# Patient Record
Sex: Male | Born: 1955 | Race: White | Hispanic: No | Smoking: Never smoker
Health system: Southern US, Community
[De-identification: ages and names within clinical notes are randomized; demographics above are authoritative.]

## PROBLEM LIST (undated history)

## (undated) DIAGNOSIS — D126 Benign neoplasm of colon, unspecified: Secondary | ICD-10-CM

## (undated) DIAGNOSIS — M3131 Wegener's granulomatosis with renal involvement: Secondary | ICD-10-CM

## (undated) DIAGNOSIS — Z9889 Other specified postprocedural states: Secondary | ICD-10-CM

## (undated) DIAGNOSIS — N493 Fournier gangrene: Secondary | ICD-10-CM

## (undated) DIAGNOSIS — E871 Hypo-osmolality and hyponatremia: Secondary | ICD-10-CM

## (undated) DIAGNOSIS — I10 Essential (primary) hypertension: Secondary | ICD-10-CM

## (undated) DIAGNOSIS — N289 Disorder of kidney and ureter, unspecified: Secondary | ICD-10-CM

## (undated) DIAGNOSIS — R112 Nausea with vomiting, unspecified: Secondary | ICD-10-CM

## (undated) DIAGNOSIS — K922 Gastrointestinal hemorrhage, unspecified: Secondary | ICD-10-CM

## (undated) DIAGNOSIS — D649 Anemia, unspecified: Secondary | ICD-10-CM

## (undated) DIAGNOSIS — F1410 Cocaine abuse, uncomplicated: Secondary | ICD-10-CM

## (undated) DIAGNOSIS — K2100 Gastro-esophageal reflux disease with esophagitis, without bleeding: Secondary | ICD-10-CM

## (undated) DIAGNOSIS — Z9289 Personal history of other medical treatment: Secondary | ICD-10-CM

## (undated) DIAGNOSIS — E041 Nontoxic single thyroid nodule: Secondary | ICD-10-CM

## (undated) DIAGNOSIS — N179 Acute kidney failure, unspecified: Secondary | ICD-10-CM

## (undated) DIAGNOSIS — I517 Cardiomegaly: Secondary | ICD-10-CM

## (undated) DIAGNOSIS — R918 Other nonspecific abnormal finding of lung field: Secondary | ICD-10-CM

## (undated) DIAGNOSIS — K5790 Diverticulosis of intestine, part unspecified, without perforation or abscess without bleeding: Secondary | ICD-10-CM

## (undated) DIAGNOSIS — N200 Calculus of kidney: Secondary | ICD-10-CM

## (undated) HISTORY — PX: SHOULDER SURGERY: SHX246

## (undated) HISTORY — DX: Cardiomegaly: I51.7

## (undated) HISTORY — DX: Acute kidney failure, unspecified: N17.9

## (undated) HISTORY — DX: Diverticulosis of intestine, part unspecified, without perforation or abscess without bleeding: K57.90

## (undated) HISTORY — DX: Gastro-esophageal reflux disease with esophagitis: K21.0

## (undated) HISTORY — DX: Other nonspecific abnormal finding of lung field: R91.8

## (undated) HISTORY — DX: Cocaine abuse, uncomplicated: F14.10

## (undated) HISTORY — DX: Hypo-osmolality and hyponatremia: E87.1

## (undated) HISTORY — DX: Benign neoplasm of colon, unspecified: D12.6

## (undated) HISTORY — PX: ANTERIOR CRUCIATE LIGAMENT REPAIR: SHX115

## (undated) HISTORY — PX: KNEE ARTHROSCOPY W/ MEDIAL COLLATERAL LIGAMENT (MCL) REPAIR: SHX1876

## (undated) HISTORY — PX: FRACTURE SURGERY: SHX138

## (undated) HISTORY — PX: VASECTOMY: SHX75

## (undated) HISTORY — DX: Nontoxic single thyroid nodule: E04.1

## (undated) HISTORY — DX: Gastro-esophageal reflux disease with esophagitis, without bleeding: K21.00

---

## 1988-01-28 HISTORY — PX: KIDNEY STONE SURGERY: SHX686

## 1997-07-06 ENCOUNTER — Emergency Department (HOSPITAL_COMMUNITY): Admission: EM | Admit: 1997-07-06 | Discharge: 1997-07-06 | Payer: Self-pay | Admitting: Emergency Medicine

## 1997-08-14 ENCOUNTER — Ambulatory Visit (HOSPITAL_COMMUNITY): Admission: RE | Admit: 1997-08-14 | Discharge: 1997-08-14 | Payer: Self-pay | Admitting: Family Medicine

## 1999-09-28 HISTORY — PX: SHOULDER OPEN ROTATOR CUFF REPAIR: SHX2407

## 2000-01-28 HISTORY — PX: KNEE ARTHROSCOPY W/ ACL RECONSTRUCTION: SHX1858

## 2002-03-16 ENCOUNTER — Ambulatory Visit (HOSPITAL_BASED_OUTPATIENT_CLINIC_OR_DEPARTMENT_OTHER): Admission: RE | Admit: 2002-03-16 | Discharge: 2002-03-16 | Payer: Self-pay | Admitting: Orthopedic Surgery

## 2002-06-30 ENCOUNTER — Emergency Department (HOSPITAL_COMMUNITY): Admission: EM | Admit: 2002-06-30 | Discharge: 2002-06-30 | Payer: Self-pay

## 2002-07-11 ENCOUNTER — Emergency Department (HOSPITAL_COMMUNITY): Admission: EM | Admit: 2002-07-11 | Discharge: 2002-07-11 | Payer: Self-pay | Admitting: Emergency Medicine

## 2002-08-25 ENCOUNTER — Encounter: Payer: Self-pay | Admitting: *Deleted

## 2002-08-25 ENCOUNTER — Emergency Department (HOSPITAL_COMMUNITY): Admission: AD | Admit: 2002-08-25 | Discharge: 2002-08-25 | Payer: Self-pay

## 2002-09-05 ENCOUNTER — Emergency Department (HOSPITAL_COMMUNITY): Admission: EM | Admit: 2002-09-05 | Discharge: 2002-09-05 | Payer: Self-pay | Admitting: Neurosurgery

## 2002-09-05 ENCOUNTER — Encounter: Payer: Self-pay | Admitting: Emergency Medicine

## 2002-11-02 ENCOUNTER — Emergency Department (HOSPITAL_COMMUNITY): Admission: EM | Admit: 2002-11-02 | Discharge: 2002-11-02 | Payer: Self-pay | Admitting: Emergency Medicine

## 2002-11-07 ENCOUNTER — Emergency Department (HOSPITAL_COMMUNITY): Admission: EM | Admit: 2002-11-07 | Discharge: 2002-11-07 | Payer: Self-pay | Admitting: Emergency Medicine

## 2003-10-12 ENCOUNTER — Emergency Department (HOSPITAL_COMMUNITY): Admission: EM | Admit: 2003-10-12 | Discharge: 2003-10-12 | Payer: Self-pay | Admitting: Emergency Medicine

## 2003-11-03 ENCOUNTER — Emergency Department (HOSPITAL_COMMUNITY): Admission: EM | Admit: 2003-11-03 | Discharge: 2003-11-03 | Payer: Self-pay | Admitting: Family Medicine

## 2003-11-09 ENCOUNTER — Emergency Department (HOSPITAL_COMMUNITY): Admission: EM | Admit: 2003-11-09 | Discharge: 2003-11-09 | Payer: Self-pay | Admitting: Emergency Medicine

## 2004-01-09 ENCOUNTER — Emergency Department (HOSPITAL_COMMUNITY): Admission: EM | Admit: 2004-01-09 | Discharge: 2004-01-09 | Payer: Self-pay | Admitting: Family Medicine

## 2004-01-12 ENCOUNTER — Emergency Department (HOSPITAL_COMMUNITY): Admission: EM | Admit: 2004-01-12 | Discharge: 2004-01-12 | Payer: Self-pay | Admitting: Emergency Medicine

## 2004-01-23 ENCOUNTER — Emergency Department (HOSPITAL_COMMUNITY): Admission: EM | Admit: 2004-01-23 | Discharge: 2004-01-23 | Payer: Self-pay | Admitting: Emergency Medicine

## 2004-01-30 ENCOUNTER — Emergency Department: Payer: Self-pay | Admitting: Emergency Medicine

## 2004-02-01 ENCOUNTER — Emergency Department (HOSPITAL_COMMUNITY): Admission: EM | Admit: 2004-02-01 | Discharge: 2004-02-01 | Payer: Self-pay | Admitting: Emergency Medicine

## 2004-02-06 ENCOUNTER — Emergency Department: Payer: Self-pay | Admitting: Emergency Medicine

## 2004-03-26 ENCOUNTER — Emergency Department (HOSPITAL_COMMUNITY): Admission: EM | Admit: 2004-03-26 | Discharge: 2004-03-26 | Payer: Self-pay | Admitting: Family Medicine

## 2004-06-11 ENCOUNTER — Emergency Department (HOSPITAL_COMMUNITY): Admission: EM | Admit: 2004-06-11 | Discharge: 2004-06-11 | Payer: Self-pay | Admitting: Family Medicine

## 2004-09-24 ENCOUNTER — Emergency Department (HOSPITAL_COMMUNITY): Admission: EM | Admit: 2004-09-24 | Discharge: 2004-09-25 | Payer: Self-pay | Admitting: Emergency Medicine

## 2004-11-25 ENCOUNTER — Emergency Department (HOSPITAL_COMMUNITY): Admission: EM | Admit: 2004-11-25 | Discharge: 2004-11-25 | Payer: Self-pay | Admitting: Emergency Medicine

## 2004-12-01 ENCOUNTER — Emergency Department (HOSPITAL_COMMUNITY): Admission: EM | Admit: 2004-12-01 | Discharge: 2004-12-01 | Payer: Self-pay | Admitting: Emergency Medicine

## 2004-12-04 ENCOUNTER — Emergency Department: Payer: Self-pay | Admitting: Internal Medicine

## 2004-12-06 ENCOUNTER — Emergency Department (HOSPITAL_COMMUNITY): Admission: EM | Admit: 2004-12-06 | Discharge: 2004-12-06 | Payer: Self-pay | Admitting: Family Medicine

## 2004-12-16 ENCOUNTER — Emergency Department (HOSPITAL_COMMUNITY): Admission: EM | Admit: 2004-12-16 | Discharge: 2004-12-16 | Payer: Self-pay | Admitting: Emergency Medicine

## 2005-01-26 ENCOUNTER — Emergency Department (HOSPITAL_COMMUNITY): Admission: EM | Admit: 2005-01-26 | Discharge: 2005-01-26 | Payer: Self-pay | Admitting: Emergency Medicine

## 2005-05-03 ENCOUNTER — Emergency Department (HOSPITAL_COMMUNITY): Admission: EM | Admit: 2005-05-03 | Discharge: 2005-05-03 | Payer: Self-pay | Admitting: Emergency Medicine

## 2005-06-02 ENCOUNTER — Emergency Department (HOSPITAL_COMMUNITY): Admission: EM | Admit: 2005-06-02 | Discharge: 2005-06-02 | Payer: Self-pay | Admitting: Emergency Medicine

## 2005-06-29 ENCOUNTER — Emergency Department (HOSPITAL_COMMUNITY): Admission: EM | Admit: 2005-06-29 | Discharge: 2005-06-29 | Payer: Self-pay | Admitting: Emergency Medicine

## 2005-07-02 ENCOUNTER — Emergency Department: Payer: Self-pay | Admitting: Unknown Physician Specialty

## 2005-07-12 ENCOUNTER — Emergency Department (HOSPITAL_COMMUNITY): Admission: EM | Admit: 2005-07-12 | Discharge: 2005-07-12 | Payer: Self-pay | Admitting: Emergency Medicine

## 2005-07-23 ENCOUNTER — Emergency Department (HOSPITAL_COMMUNITY): Admission: EM | Admit: 2005-07-23 | Discharge: 2005-07-23 | Payer: Self-pay | Admitting: Emergency Medicine

## 2006-05-05 ENCOUNTER — Emergency Department (HOSPITAL_COMMUNITY): Admission: EM | Admit: 2006-05-05 | Discharge: 2006-05-05 | Payer: Self-pay | Admitting: Family Medicine

## 2007-08-16 ENCOUNTER — Emergency Department (HOSPITAL_COMMUNITY): Admission: EM | Admit: 2007-08-16 | Discharge: 2007-08-16 | Payer: Self-pay | Admitting: Emergency Medicine

## 2008-05-27 HISTORY — PX: INCISION AND DRAINAGE PERIRECTAL ABSCESS: SHX1804

## 2008-06-04 ENCOUNTER — Inpatient Hospital Stay (HOSPITAL_COMMUNITY): Admission: EM | Admit: 2008-06-04 | Discharge: 2008-06-09 | Payer: Self-pay | Admitting: *Deleted

## 2008-06-04 ENCOUNTER — Encounter (INDEPENDENT_AMBULATORY_CARE_PROVIDER_SITE_OTHER): Payer: Self-pay | Admitting: General Surgery

## 2008-06-21 ENCOUNTER — Emergency Department (HOSPITAL_COMMUNITY): Admission: EM | Admit: 2008-06-21 | Discharge: 2008-06-22 | Payer: Self-pay | Admitting: Emergency Medicine

## 2009-10-02 ENCOUNTER — Emergency Department (HOSPITAL_COMMUNITY): Admission: EM | Admit: 2009-10-02 | Discharge: 2009-10-03 | Payer: Self-pay | Admitting: Emergency Medicine

## 2010-03-25 ENCOUNTER — Emergency Department (HOSPITAL_COMMUNITY)
Admission: EM | Admit: 2010-03-25 | Discharge: 2010-03-25 | Disposition: A | Discharge status: Home / Self Care | Payer: Self-pay | Attending: Emergency Medicine | Admitting: Emergency Medicine

## 2010-03-25 DIAGNOSIS — B9789 Other viral agents as the cause of diseases classified elsewhere: Secondary | ICD-10-CM | POA: Insufficient documentation

## 2010-03-25 DIAGNOSIS — R52 Pain, unspecified: Secondary | ICD-10-CM | POA: Insufficient documentation

## 2010-03-25 DIAGNOSIS — R05 Cough: Secondary | ICD-10-CM | POA: Insufficient documentation

## 2010-03-25 DIAGNOSIS — R51 Headache: Secondary | ICD-10-CM | POA: Insufficient documentation

## 2010-03-25 DIAGNOSIS — R059 Cough, unspecified: Secondary | ICD-10-CM | POA: Insufficient documentation

## 2010-03-25 DIAGNOSIS — R509 Fever, unspecified: Secondary | ICD-10-CM | POA: Insufficient documentation

## 2010-03-25 DIAGNOSIS — Z85048 Personal history of other malignant neoplasm of rectum, rectosigmoid junction, and anus: Secondary | ICD-10-CM | POA: Insufficient documentation

## 2010-05-07 LAB — DIFFERENTIAL
Basophils Absolute: 0 10*3/uL (ref 0.0–0.1)
Basophils Absolute: 0 10*3/uL (ref 0.0–0.1)
Basophils Relative: 0 % (ref 0–1)
Basophils Relative: 0 % (ref 0–1)
Eosinophils Absolute: 0 10*3/uL (ref 0.0–0.7)
Eosinophils Relative: 0 % (ref 0–5)
Lymphocytes Relative: 6 % — ABNORMAL LOW (ref 12–46)
Lymphocytes Relative: 10 % — ABNORMAL LOW (ref 12–46)
Lymphs Abs: 0.6 10*3/uL — ABNORMAL LOW (ref 0.7–4.0)
Monocytes Absolute: 0.3 10*3/uL (ref 0.1–1.0)
Monocytes Absolute: 0.2 10*3/uL (ref 0.1–1.0)
Monocytes Relative: 3 % (ref 3–12)
Neutro Abs: 9.4 10*3/uL — ABNORMAL HIGH (ref 1.7–7.7)
Neutro Abs: 6.3 10*3/uL (ref 1.7–7.7)
Neutrophils Relative %: 91 % — ABNORMAL HIGH (ref 43–77)
Neutrophils Relative %: 86 % — ABNORMAL HIGH (ref 43–77)

## 2010-05-07 LAB — CBC
HCT: 41.1 % (ref 39.0–52.0)
HCT: 41.8 % (ref 39.0–52.0)
Hemoglobin: 14.2 g/dL (ref 13.0–17.0)
Hemoglobin: 14.6 g/dL (ref 13.0–17.0)
MCHC: 34.6 g/dL (ref 30.0–36.0)
MCHC: 34.9 g/dL (ref 30.0–36.0)
MCV: 85.5 fL (ref 78.0–100.0)
MCV: 88 fL (ref 78.0–100.0)
Platelets: 326 10*3/uL (ref 150–400)
Platelets: 211 10*3/uL (ref 150–400)
Platelets: 216 10*3/uL (ref 150–400)
RBC: 4.81 MIL/uL (ref 4.22–5.81)
RBC: 4.75 MIL/uL (ref 4.22–5.81)
RDW: 15 % (ref 11.5–15.5)
RDW: 14.2 % (ref 11.5–15.5)
RDW: 14.3 % (ref 11.5–15.5)
WBC: 10.3 10*3/uL (ref 4.0–10.5)
WBC: 7.4 10*3/uL (ref 4.0–10.5)

## 2010-05-07 LAB — WOUND CULTURE

## 2010-05-07 LAB — RAPID URINE DRUG SCREEN, HOSP PERFORMED
Amphetamines: NOT DETECTED
Barbiturates: NOT DETECTED
Benzodiazepines: NOT DETECTED
Cocaine: POSITIVE — AB
Opiates: POSITIVE — AB
Tetrahydrocannabinol: NOT DETECTED

## 2010-05-07 LAB — URINALYSIS, ROUTINE W REFLEX MICROSCOPIC
Glucose, UA: NEGATIVE mg/dL
Hgb urine dipstick: NEGATIVE
Leukocytes, UA: NEGATIVE
Nitrite: NEGATIVE
Protein, ur: 30 mg/dL — AB
Specific Gravity, Urine: 1.041 — ABNORMAL HIGH (ref 1.005–1.030)
Urobilinogen, UA: 1 mg/dL (ref 0.0–1.0)
pH: 5.5 (ref 5.0–8.0)

## 2010-05-07 LAB — CREATININE, SERUM
Creatinine, Ser: 1.02 mg/dL (ref 0.4–1.5)
GFR calc Af Amer: >60 mL/min (ref >60)

## 2010-05-07 LAB — BASIC METABOLIC PANEL
BUN: 17 mg/dL (ref 6–23)
BUN: 11 mg/dL (ref 6–23)
CO2: 25 mEq/L (ref 19–32)
Calcium: 10 mg/dL (ref 8.4–10.5)
Calcium: 8.2 mg/dL — ABNORMAL LOW (ref 8.4–10.5)
Chloride: 106 mEq/L (ref 96–112)
Creatinine, Ser: 1.55 mg/dL — ABNORMAL HIGH (ref 0.4–1.5)
GFR calc Af Amer: 57 mL/min — ABNORMAL LOW (ref >60)
GFR calc non Af Amer: 47 mL/min — ABNORMAL LOW (ref >60)
GFR calc non Af Amer: >60 mL/min (ref >60)
Glucose, Bld: 151 mg/dL — ABNORMAL HIGH (ref 70–99)
Glucose, Bld: 101 mg/dL — ABNORMAL HIGH (ref 70–99)
Potassium: 3.8 mEq/L (ref 3.5–5.1)
Sodium: 141 mEq/L (ref 135–145)

## 2010-05-07 LAB — ETHANOL: Alcohol, Ethyl (B): <5 mg/dL (ref 0–10)

## 2010-05-07 LAB — COMPREHENSIVE METABOLIC PANEL
Albumin: 2.2 g/dL — ABNORMAL LOW (ref 3.5–5.2)
Alkaline Phosphatase: 52 U/L (ref 39–117)
BUN: 31 mg/dL — ABNORMAL HIGH (ref 6–23)
CO2: 27 mEq/L (ref 19–32)
Chloride: 102 mEq/L (ref 96–112)
GFR calc non Af Amer: 55 mL/min — ABNORMAL LOW (ref >60)
Potassium: 3.4 mEq/L — ABNORMAL LOW (ref 3.5–5.1)
Total Bilirubin: 1.5 mg/dL — ABNORMAL HIGH (ref 0.3–1.2)

## 2010-05-07 LAB — SALICYLATE LEVEL: Salicylate Lvl: <4 mg/dL (ref 2.8–20.0)

## 2010-05-07 LAB — ABO/RH: ABO/RH(D): A POS

## 2010-05-07 LAB — URINE MICROSCOPIC-ADD ON

## 2010-05-07 LAB — ACETAMINOPHEN LEVEL
Acetaminophen (Tylenol), Serum: <10 ug/mL — ABNORMAL LOW (ref 10–30)
Acetaminophen (Tylenol), Serum: <10 ug/mL — ABNORMAL LOW (ref 10–30)

## 2010-05-07 LAB — TYPE AND SCREEN: ABO/RH(D): A POS

## 2010-05-07 LAB — PROTIME-INR
INR: 1.2 (ref 0.00–1.49)
Prothrombin Time: 15.8 seconds — ABNORMAL HIGH (ref 11.6–15.2)

## 2010-05-07 LAB — VANCOMYCIN, TROUGH: Vancomycin Tr: 14.5 ug/mL (ref 10.0–20.0)

## 2010-05-07 LAB — APTT: aPTT: 45 seconds — ABNORMAL HIGH (ref 24–37)

## 2010-05-07 LAB — ANAEROBIC CULTURE

## 2010-05-07 LAB — TRICYCLICS SCREEN, URINE: TCA Scrn: NOT DETECTED

## 2010-06-11 NOTE — Op Note (Signed)
NAME:  Alexander Grant, CHANCE NO.:  [id number]   MEDICAL RECORD NO.:  [id number]          PATIENT TYPE:  INP   LOCATION:  1313                         FACILITY:  [facility]   PHYSICIAN:  Angelia Mould. 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   POSTOPERATIVE DIAGNOSIS:  Perirectal abscess and necrotizing fasciitis  of the perineum.   OPERATION PERFORMED:  Incision and drainage of perirectal abscess,  extensive debridement of skin and subcutaneous tissue of the perirectal  area and right perineum.   SURGEON:  Angelia Mould. Derrell Lolling, M.D.   OPERATIVE INDICATIONS:  This is a healthy, 55 year old Caucasian  gentleman who presents with a 5-day history of progressive pain,  redness, swelling of his right perianal area both anteriorly and  posteriorly.  He came to the emergency room.  On examination he has a  large area of inflammation, induration, erythema, tenderness, and foul-  smelling drainage involving the right posterior and right anterior  perianal area and some tenderness extending up in the right perineum  toward the base of the scrotum.  There is necrotic skin overlying this  in the perianal area.  The penis and scrotum appear to be spared from  this.  He is brought to the operating room emergently.   OPERATIVE FINDINGS:  The patient had what appeared to be a synergistic,  necrotizing infection involving the perianal skin extending far  posteriorly and then all the way up into the anterior perineum, all the  way just about to the base of the scrotum.  This required debridement of  extensive skin and subcutaneous tissue, an area 17 x 8 cm, and  debridement of a lot of a blackened necrotic soft tissue.  After this  was done though, the infection seemed to be well confined and  controlled.   The perirectal sphincters were clearly preserved.   OPERATIVE TECHNIQUE:  Following induction of general endotracheal  anesthesia, the patient was placed in the dorsal lithotomy position.  Perianal area was prepped and draped in sterile fashion.  I examined the  wound thoroughly.  I did a digital rectal exam and the sphincter tone  appeared to be normal.  There was no rectal mass.  This did not appear  to be extending into the anal canal or rectum.   I started by making an elliptical incision in the right posterior  position and opened up the cavity and cultured the purulent material  with aerobic and anaerobic cultures.  The skin and superficial  subcutaneous tissue were clearly devascularized and necrotic and so were  simply debrided.  This debridement continued until I felt like I had  healthy bleeding tissue.  The dissection had to continue anteriorly and  there was a tract going up toward the base of the scrotum in the  perineum just on the right side of the midline.  All of this was  unroofed.  The base of the wound had some necrotic  grayish blackish  tissue and this was all debrided with electrocautery.  At no time did we  injure any of the sphincter muscles.  This did not require dissection  down into the gluteal areas or to the thigh muscles.  We felt that we  had it completely controlled.  At the end we measured the wound and it  measured 17 cm in anterior posterior dimension and 8 cm in transverse  dimension.  Photographs could not be taken as the camera was locked up  and unavailable.   The wound was further debrided using the pulsatile irrigator.  Hemostasis was very good and achieved electrocautery.  The wound was  packed with saline moistened Kerlix and covered with dry bandages.  The  patient tolerated the procedure well and was taken to the recovery room  in stable condition.  Estimated blood loss was 30-40 mL.  Complications  none.  Sponge, needle and instruments were  correct.      Angelia Mould. Derrell Lolling, M.D.  Electronically Signed     HMI/MEDQ  D:  06/04/2008  T:  06/04/2008  Job:  [id number]

## 2010-06-11 NOTE — H&P (Signed)
NAME:  Alexander Grant, Alexander Grant NO.:  [id number]   MEDICAL RECORD NO.:  [id number]          PATIENT TYPE:  EMS   LOCATION:  ED                           FACILITY:  [facility]   PHYSICIAN:  Angelia Mould. Derrell Lolling, M.D.DATE OF BIRTH:  Jun 27, 1955   DATE OF ADMISSION:  06/04/2008  DATE OF DISCHARGE:                              HISTORY & PHYSICAL   CHIEF COMPLAINT:  Right-sided perirectal pain and swelling and drainage.   HISTORY:  This is a healthy 55 year old Caucasian gentleman who states  he noted pain and swelling in his right buttock area 5 days ago.  This  has been progressive.  He says it was huge until it popped and started  draining about 12-24 hours ago.  There is still severe pain.  He has no  abdominal pain.  He has no nausea or vomiting.  He has had no shaking  chills. I was called to see him after he was evaluated by the emergency  department physician.   PAST HISTORY:  He has had a small perirectal abscess I and D'd in the  past, but it was trivial compared to this according to him.  He has had  right shoulder surgery, left knee surgery, cystoscopy for retrieval of  kidney stones.  Otherwise healthy.  No medical or surgical problems  otherwise.   MEDICATIONS:  none.   DRUG ALLERGIES:  PERCOCET, DARVOCET.   FAMILY HISTORY:  Mother living, had a stroke many years ago.  Father  deceased had lung cancer.  He has 2 siblings doing well.   SOCIAL HISTORY:  He is divorced, has 1 child.  Denies tobacco.  Denies  alcohol. 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   REVIEW OF SYSTEMS:  A 10 system review of systems is performed and is  noncontributory except as listed above.   PHYSICAL EXAMINATION:  GENERAL: Thin, healthy-appearing middle-aged  gentleman in mild to moderate distress.  VITAL SIGNS:  Temperature 97.7, pulse 86, respirations 20, blood  pressure 132/84.  HEENT:  Eyes:  Sclerae clear.  Extraocular is intact.  Ears, nose,  mouth  and throat, nose, tongue and oropharynx are without gross lesions.  NECK: Supple, nontender.  No mass.  No jugular venous distention.  LUNGS: Clear to auscultation.  CHEST:  No chest wall tenderness.  HEART:  Regular rate and rhythm.  No murmur.  No ectopy.  Radial and  posterior tibial pulses are palpable.  ABDOMEN: Soft, flat, nontender.  Liver and spleen not enlarged.  No  mass.  No hernia.  No scars.  GENITOURINARY:  The inguinal areas look  normal.  The penis, scrotum, and testes look normal.  No signs of  infection.  In the perianal area there is a huge perianal cellulitis and  abscess involving the entire right hemisphere, right anterior and right  posterior.  There is very darkened skin overlying this.  There is foul-  smelling drainage coming from this.  Infection does not appear to cross  the midline.  Rectal:  He is too tender for a rectal exam.  EXTREMITIES:  He moves all 4 extremities well without pain or deformity.  NEUROLOGIC:  No gross motor or sensory deficits.   Data were reviewed.  Lab work is ordered and pending.   ASSESSMENT:  Complex perirectal abscess right anterior and right  posterior with overlying skin necrosis.  Question whether this might be  an early Fournier's gangrene   PLAN:  1. The patient will be started on IV fluids, IV vancomycin, IV Zosyn,      and will be taken to operating room promptly for drainage and/or      debridement.  2. I have discussed this with the patient.  I have told him that he      may be left with a large wound.  I told him that he may have to be      returned to the operating room for further debridement if the      infection is extensive.  I told him that he may have bleeding      problems.  I told him that it may injure his rectum.  He      understands all of this and is ready to go the OR.  All of his      questions were answered.      Angelia Mould. Derrell Lolling, M.D.  Electronically Signed     HMI/MEDQ  D:   06/04/2008  T:  06/04/2008  Job:  [id number]

## 2010-06-14 NOTE — Discharge Summary (Signed)
NAME:  Alexander Grant, Alexander Grant NO.:  [id number]   MEDICAL RECORD NO.:  [id number]          PATIENT TYPE:  INP   LOCATION:  1313                         FACILITY:  [facility]   PHYSICIAN:  Angelia Mould. Derrell Lolling, M.D.DATE OF BIRTH:  12-09-1955   DATE OF ADMISSION:  06/04/2008  DATE OF DISCHARGE:  06/09/2008                               DISCHARGE SUMMARY   FINAL DIAGNOSES:  1. Necrotizing soft tissue infection perianal and perineal area      (Fournier's gangrene).  2. History of kidney stones.   OPERATIONS PERFORMED:  Incision and drainage of perirectal abscess,  debridement of skin and subcutaneous tissue of right perianal and  perineal area (17 x 8 cm area).  Date of surgery was May 9.   HISTORY:  This is a 55 year old Caucasian gentleman who reported pain  and swelling in his right buttock 5 days prior to admission.  The pain  and swelling was progressive.  He says it was huge until it popped about  12-24 hours prior to admission.  It has  been draining since that time.  He still has severe pain.  He had had a small perirectal abscess years  ago that healed.  I was called to evaluate him at the request of the  emergency department physician.   PHYSICAL EXAMINATION ON ADMISSION:  Revealed a temp of 97.7, pulse of 86, respirations 20.  ABDOMEN:  Soft and nontender.  GENITOURINARY:  The anal areas, penis, scrotum, and testes were normal.  The perianal area there was a huge area of cellulitis and abscess  involving the right hemisphere, right anterior and right posterior and  extending up into the perineum toward the inguinal crease but not quite  there.  The skin was very dark and overlying this there was foul-  smelling drainage.  The infection did not appear to cross the midline.   HOSPITAL COURSE:  The patient was admitted, started on IV fluid  resuscitation and IV antibiotics.  He was taken to the operating room  where this area was drained, and required extensive  debridement because  of what appeared to be a necrotizing, synergistic infection involving  the perianal area and the right anterior and right posterior area and  the perineum all the way up toward the inguinal crease.  After debriding  all this area we seemed to have everything under control.   On postoperative day #1 the patient felt better.  We started changing  his dressings.  The wounds looked pretty clean, and he did not need any  further debridement.  Cultures were polymicrobial with gram-positive  cocci, gram-negative rods, and gram-positive rods.  He was treated with  IV vancomycin and IV Primaxin.  He was seen by the wound and ostomy  incontinence nurse who assisted with wound care and pulsatile lavage.  We kept him in the hospital for several days to make sure that the  infection stayed under control, and to be sure that he resumed diet and  good bowel function.  He did resume bowel function, was completely  continent, had no problems with bowel continence, and felt  better each  day.  His cultures grew E. coli.  There was no evidence of MRSA  identified.  He was discharged on  May 14 feeling fairly well with pain under control, a clean wound, and  no unusual drainage.  He was given a prescription for oral Augmentin for  over 7 more days and a prescription for Tylox.  Home health nursing was  requested for twice daily dressing changes.  Follow-up with me in 1-2  weeks was arranged.      Angelia Mould. Derrell Lolling, M.D.  Electronically Signed     HMI/MEDQ  D:  07/25/2008  T:  07/25/2008  Job:  [id number]

## 2011-01-28 DIAGNOSIS — F1410 Cocaine abuse, uncomplicated: Secondary | ICD-10-CM

## 2011-01-28 HISTORY — DX: Cocaine abuse, uncomplicated: F14.10

## 2011-08-28 DIAGNOSIS — Z9289 Personal history of other medical treatment: Secondary | ICD-10-CM

## 2011-08-28 HISTORY — DX: Personal history of other medical treatment: Z92.89

## 2011-09-01 ENCOUNTER — Inpatient Hospital Stay (HOSPITAL_COMMUNITY)
Admission: EM | Admit: 2011-09-01 | Discharge: 2011-09-13 | DRG: 377 | Disposition: A | Discharge status: Home / Self Care | Payer: MEDICAID | Attending: Family Medicine | Admitting: Family Medicine

## 2011-09-01 ENCOUNTER — Emergency Department (HOSPITAL_COMMUNITY): Payer: Self-pay

## 2011-09-01 ENCOUNTER — Encounter (HOSPITAL_COMMUNITY): Payer: Self-pay

## 2011-09-01 ENCOUNTER — Inpatient Hospital Stay (HOSPITAL_COMMUNITY): Payer: Self-pay

## 2011-09-01 DIAGNOSIS — E041 Nontoxic single thyroid nodule: Secondary | ICD-10-CM | POA: Diagnosis present

## 2011-09-01 DIAGNOSIS — N019 Rapidly progressive nephritic syndrome with unspecified morphologic changes: Secondary | ICD-10-CM | POA: Diagnosis present

## 2011-09-01 DIAGNOSIS — E872 Acidosis, unspecified: Secondary | ICD-10-CM | POA: Diagnosis present

## 2011-09-01 DIAGNOSIS — N032 Chronic nephritic syndrome with diffuse membranous glomerulonephritis: Secondary | ICD-10-CM | POA: Diagnosis present

## 2011-09-01 DIAGNOSIS — D638 Anemia in other chronic diseases classified elsewhere: Secondary | ICD-10-CM | POA: Diagnosis present

## 2011-09-01 DIAGNOSIS — F1410 Cocaine abuse, uncomplicated: Secondary | ICD-10-CM | POA: Diagnosis present

## 2011-09-01 DIAGNOSIS — K5730 Diverticulosis of large intestine without perforation or abscess without bleeding: Secondary | ICD-10-CM | POA: Diagnosis present

## 2011-09-01 DIAGNOSIS — D126 Benign neoplasm of colon, unspecified: Secondary | ICD-10-CM | POA: Diagnosis present

## 2011-09-01 DIAGNOSIS — K2100 Gastro-esophageal reflux disease with esophagitis, without bleeding: Secondary | ICD-10-CM

## 2011-09-01 DIAGNOSIS — R0902 Hypoxemia: Secondary | ICD-10-CM

## 2011-09-01 DIAGNOSIS — N2581 Secondary hyperparathyroidism of renal origin: Secondary | ICD-10-CM | POA: Diagnosis present

## 2011-09-01 DIAGNOSIS — R0602 Shortness of breath: Secondary | ICD-10-CM | POA: Diagnosis present

## 2011-09-01 DIAGNOSIS — K2960 Other gastritis without bleeding: Secondary | ICD-10-CM

## 2011-09-01 DIAGNOSIS — I517 Cardiomegaly: Secondary | ICD-10-CM | POA: Diagnosis present

## 2011-09-01 DIAGNOSIS — K5790 Diverticulosis of intestine, part unspecified, without perforation or abscess without bleeding: Secondary | ICD-10-CM | POA: Diagnosis present

## 2011-09-01 DIAGNOSIS — K922 Gastrointestinal hemorrhage, unspecified: Secondary | ICD-10-CM

## 2011-09-01 DIAGNOSIS — N179 Acute kidney failure, unspecified: Secondary | ICD-10-CM

## 2011-09-01 DIAGNOSIS — I509 Heart failure, unspecified: Secondary | ICD-10-CM | POA: Diagnosis present

## 2011-09-01 DIAGNOSIS — K635 Polyp of colon: Secondary | ICD-10-CM

## 2011-09-01 DIAGNOSIS — F1490 Cocaine use, unspecified, uncomplicated: Secondary | ICD-10-CM

## 2011-09-01 DIAGNOSIS — I10 Essential (primary) hypertension: Secondary | ICD-10-CM | POA: Diagnosis present

## 2011-09-01 DIAGNOSIS — D62 Acute posthemorrhagic anemia: Secondary | ICD-10-CM

## 2011-09-01 DIAGNOSIS — K2971 Gastritis, unspecified, with bleeding: Principal | ICD-10-CM | POA: Diagnosis present

## 2011-09-01 DIAGNOSIS — N059 Unspecified nephritic syndrome with unspecified morphologic changes: Secondary | ICD-10-CM | POA: Diagnosis present

## 2011-09-01 DIAGNOSIS — I498 Other specified cardiac arrhythmias: Secondary | ICD-10-CM | POA: Diagnosis not present

## 2011-09-01 DIAGNOSIS — R195 Other fecal abnormalities: Secondary | ICD-10-CM

## 2011-09-01 DIAGNOSIS — Z79899 Other long term (current) drug therapy: Secondary | ICD-10-CM

## 2011-09-01 DIAGNOSIS — R918 Other nonspecific abnormal finding of lung field: Secondary | ICD-10-CM

## 2011-09-01 HISTORY — DX: Calculus of kidney: N20.0

## 2011-09-01 HISTORY — DX: Other specified postprocedural states: Z98.890

## 2011-09-01 HISTORY — DX: Nausea with vomiting, unspecified: R11.2

## 2011-09-01 HISTORY — DX: Fournier gangrene: N49.3

## 2011-09-01 LAB — CBC WITH DIFFERENTIAL/PLATELET
Eosinophils Relative: 4 % (ref 0–5)
HCT: 12.8 % — ABNORMAL LOW (ref 39.0–52.0)
Lymphocytes Relative: 20 % (ref 12–46)
Lymphs Abs: 1.2 10*3/uL (ref 0.7–4.0)
MCV: 87.7 fL (ref 78.0–100.0)
Monocytes Absolute: 0.4 10*3/uL (ref 0.1–1.0)
Monocytes Relative: 7 % (ref 3–12)
RBC: 1.46 MIL/uL — ABNORMAL LOW (ref 4.22–5.81)
RDW: 13.8 % (ref 11.5–15.5)
WBC: 6 10*3/uL (ref 4.0–10.5)

## 2011-09-01 LAB — COMPREHENSIVE METABOLIC PANEL
Albumin: 3.3 g/dL — ABNORMAL LOW (ref 3.5–5.2)
BUN: 110 mg/dL — ABNORMAL HIGH (ref 6–23)
Calcium: 9.2 mg/dL (ref 8.4–10.5)
Creatinine, Ser: 13.32 mg/dL — ABNORMAL HIGH (ref 0.50–1.35)
Total Bilirubin: 0.8 mg/dL (ref 0.3–1.2)
Total Protein: 7 g/dL (ref 6.0–8.3)

## 2011-09-01 LAB — CK: Total CK: 96 U/L (ref 7–232)

## 2011-09-01 LAB — PROTIME-INR: Prothrombin Time: 15.7 seconds — ABNORMAL HIGH (ref 11.6–15.2)

## 2011-09-01 LAB — URINE MICROSCOPIC-ADD ON

## 2011-09-01 LAB — PREPARE RBC (CROSSMATCH)

## 2011-09-01 LAB — URINALYSIS, ROUTINE W REFLEX MICROSCOPIC
Bilirubin Urine: NEGATIVE
Glucose, UA: NEGATIVE mg/dL
Ketones, ur: NEGATIVE mg/dL
Nitrite: NEGATIVE
pH: 6 (ref 5.0–8.0)

## 2011-09-01 LAB — POCT I-STAT TROPONIN I: Troponin i, poc: 0.05 ng/mL (ref 0.00–0.08)

## 2011-09-01 LAB — RETICULOCYTES
RBC.: 1.46 MIL/uL — ABNORMAL LOW (ref 4.22–5.81)
Retic Count, Absolute: 83.2 10*3/uL (ref 19.0–186.0)

## 2011-09-01 LAB — APTT: aPTT: 43 seconds — ABNORMAL HIGH (ref 24–37)

## 2011-09-01 LAB — MRSA PCR SCREENING: MRSA by PCR: NEGATIVE

## 2011-09-01 MED ORDER — METOPROLOL TARTRATE 50 MG PO TABS
50.0000 mg | ORAL_TABLET | Freq: Two times a day (BID) | ORAL | Status: DC
  Administered 2011-09-01 – 2011-09-05 (×9): 50 mg via ORAL
  Filled 2011-09-01 (×12): qty 1

## 2011-09-01 MED ORDER — SODIUM CHLORIDE 0.9 % IJ SOLN
3.0000 mL | Freq: Two times a day (BID) | INTRAMUSCULAR | Status: DC
  Administered 2011-09-01 – 2011-09-04 (×7): 3 mL via INTRAVENOUS
  Administered 2011-09-05: 10 mL via INTRAVENOUS
  Administered 2011-09-05 – 2011-09-12 (×15): 3 mL via INTRAVENOUS

## 2011-09-01 MED ORDER — ALBUTEROL SULFATE (5 MG/ML) 0.5% IN NEBU: 2.5000 mg | INHALATION_SOLUTION | RESPIRATORY_TRACT | Status: DC | PRN

## 2011-09-01 MED ORDER — PANTOPRAZOLE SODIUM 40 MG IV SOLR
8.0000 mg/h | INTRAVENOUS | Status: DC
  Filled 2011-09-01: qty 80

## 2011-09-01 MED ORDER — PANTOPRAZOLE SODIUM 40 MG IV SOLR
40.0000 mg | Freq: Two times a day (BID) | INTRAVENOUS | Status: DC
  Administered 2011-09-01 – 2011-09-08 (×13): 40 mg via INTRAVENOUS
  Filled 2011-09-01 (×15): qty 40

## 2011-09-01 MED ORDER — SODIUM CHLORIDE 0.9 % IV SOLN
1000.0000 mL | INTRAVENOUS | Status: DC
  Administered 2011-09-01: 1000 mL via INTRAVENOUS

## 2011-09-01 MED ORDER — ONDANSETRON HCL 4 MG PO TABS: 4.0000 mg | ORAL_TABLET | Freq: Four times a day (QID) | ORAL | Status: DC | PRN

## 2011-09-01 MED ORDER — ONDANSETRON HCL 4 MG/2ML IJ SOLN
4.0000 mg | Freq: Four times a day (QID) | INTRAMUSCULAR | Status: DC | PRN
  Administered 2011-09-01: 4 mg via INTRAVENOUS
  Filled 2011-09-01 (×2): qty 2

## 2011-09-01 MED ORDER — GUAIFENESIN-DM 100-10 MG/5ML PO SYRP
5.0000 mL | ORAL_SOLUTION | ORAL | Status: DC | PRN
  Filled 2011-09-01: qty 5

## 2011-09-01 MED ORDER — DEXTROSE-NACL 5-0.45 % IV SOLN: INTRAVENOUS | Status: AC

## 2011-09-01 NOTE — Progress Notes (Addendum)
IFE  Urine collected and sent. By [employer] nurse Elnita Maxwell.  Pt currently with 2nd unit of blood transfusing.  Third unit up at 22:00.  Fourth and last unit of PRBC's up 0145 am, follow up with lab staff for lab draw two hours post transfusion.

## 2011-09-01 NOTE — ED Notes (Addendum)
Pt reports generalized weakness, sob, dizziness when ambulating onset Wednesday, appeared pale, and loss of energy, and fatigue. No vision problem, denied pain. HR 79. Bp 165/91 in room. Pt runs 3 times a week, 2-3 miles per run, has loss 30 lbs since January from running; intentional weightloss.

## 2011-09-01 NOTE — ED Notes (Signed)
US at bedside

## 2011-09-01 NOTE — Progress Notes (Signed)
Alexander Grant, is a 56 y.o. male,   MRN: [id number]  -  DOB - April 28, 1955  Outpatient Primary MD for the patient is No primary provider on file.  in for    Chief Complaint  Patient presents with  . Weakness     Blood pressure 165/95, pulse 88, temperature 98.5 F (36.9 C), temperature source Oral, resp. rate 20, height 6\' 2"  (1.88 m), weight 86.183 kg (190 lb), SpO2 96.00%.  Principal Problem:  *Acute blood loss anemia Active Problems:  GI bleed  Acute renal failure   56 yo with no significant medical hx presents to [facility] ED cc weakness. Pt reports developing weakness last 2 days and sob today. This am at work, coworkers noted pallor and encouraged trip to ED. Denies abdominal pain/nausea/vomiting, BRBPR or melena. Work up in ED yields Hg 4.3, BUN 110 and creatinine 13/32.   On exam pt appears pale, weak otherwise NAD. VSS. SBP 165 HR 88.   Have called GI for consult  Will admit to SD   Lesle Chris. Black, NP

## 2011-09-01 NOTE — ED Notes (Signed)
Pt denies any SOB, cp, back pain or noticing any rash 15 minutes after starting blood transfusion

## 2011-09-01 NOTE — ED Notes (Signed)
[id number]<WG> Expected date:<BR> Expected time:<BR> Means of arrival:<BR> Comments:<BR> Triage 3

## 2011-09-01 NOTE — ED Provider Notes (Addendum)
History     CSN: [id number] Arrival date & time 09/01/11  1116 First MD Initiated Contact with Patient 09/01/11 1206     Chief Complaint  Patient presents with  . Weakness   HPI Pt started feeling weak on Monday.  He generally runs a few miles, three times a week but has been unable to do so recently.   Pt states in the last couple of days he has felt very weak asi f he would pass out.  Co workers noticed that he was very pale today.  He has no energy,.  No chest pain.  He does get short of breath when walking.  He has had some mild headache and sneezing.  He is feeling better resting and with IV fluids.  Past Medical History  Diagnosis Date  . Kidney stone   . H/O rotator cuff surgery   . H/O removal of cyst     No past surgical history on file.  No family history on file.  History  Substance Use Topics  . Smoking status: Never Smoker   . Smokeless tobacco: Not on file  . Alcohol Use: No      Review of Systems  Constitutional: Negative for fever and unexpected weight change.  Respiratory: Positive for cough.   Cardiovascular: Negative for chest pain.  Gastrointestinal: Negative for vomiting, abdominal pain and blood in stool.  Genitourinary: Negative for dysuria.  All other systems reviewed and are negative.    Allergies  Percocet  Home Medications   Current Outpatient Rx  Name Route Sig Dispense Refill  . NAPROXEN SODIUM 220 MG PO TABS Oral Take 440 mg by mouth once.    Marland Kitchen PSEUDOEPH-DOXYLAMINE-DM-APAP 60-7.06-25-998 MG/30ML PO LIQD Oral Take 30 mLs by mouth every 4 (four) hours as needed. Cold and sinus symptons      BP 166/84  Pulse 79  Temp 97.7 F (36.5 C) (Oral)  Resp 18  Ht 6\' 2"  (1.88 m)  Wt 190 lb (86.183 kg)  BMI 24.39 kg/m2  SpO2 96%  Physical Exam  Nursing note and vitals reviewed. Constitutional: He appears well-developed and well-nourished. No distress.  HENT:  Head: Normocephalic and atraumatic.  Right Ear: External ear normal.  Left  Ear: External ear normal.  Mouth/Throat: No oropharyngeal exudate.  Eyes: Conjunctivae are normal. Right eye exhibits no discharge. Left eye exhibits no discharge. No scleral icterus.       Pale conjunctiva   Neck: Neck supple. No tracheal deviation present.  Cardiovascular: Normal rate, regular rhythm and intact distal pulses.   Pulmonary/Chest: Effort normal and breath sounds normal. No stridor. No respiratory distress. He has no wheezes. He has no rales.  Abdominal: Soft. Bowel sounds are normal. He exhibits no distension. There is no tenderness. There is no rebound and no guarding.  Musculoskeletal: He exhibits no edema and no tenderness.  Neurological: He is alert. He has normal strength. No sensory deficit. Cranial nerve deficit:  no gross defecits noted. He exhibits normal muscle tone. He displays no seizure activity. Coordination normal.  Skin: Skin is warm and dry. No rash noted. There is pallor.  Psychiatric: He has a normal mood and affect.    ED Course  Procedures (including critical care time)  CRITICAL CARE Performed by: Linwood Dibbles R Total critical care time: 35 Critical care time was exclusive of separately billable procedures and treating other patients. Critical care was necessary to treat or prevent imminent or life-threatening deterioration. Critical care was time spent personally by me on  the following activities: development of treatment plan with patient and/or surrogate as well as nursing, discussions with consultants, evaluation of patient's response to treatment, examination of patient, obtaining history from patient or surrogate, ordering and performing treatments and interventions, ordering and review of laboratory studies, ordering and review of radiographic studies, pulse oximetry and re-evaluation of patient's condition.    EKG Rate 84, nl intervals, nl st - t waves SINUS RHYTHM ~ normal P axis, V-rate 50- 99 VENTRICULAR PREMATURE COMPLEX ~ V complex w/  short R-R interval PROBABLE LATERAL INFARCT, AGE INDETERMINATE ~ Q >52mS, I aVL V5 V6 Labs Reviewed  APTT - Abnormal; Notable for the following:    aPTT 43 (*)     All other components within normal limits  PROTIME-INR - Abnormal; Notable for the following:    Prothrombin Time 15.7 (*)     All other components within normal limits  CBC WITH DIFFERENTIAL - Abnormal; Notable for the following:    RBC 1.46 (*)     Hemoglobin 4.3 (*)     HCT 12.8 (*)     All other components within normal limits  TYPE AND SCREEN  PREPARE RBC (CROSSMATCH)  OCCULT BLOOD, POC DEVICE  COMPREHENSIVE METABOLIC PANEL  VITAMIN B12  FOLATE  IRON AND TIBC  FERRITIN  RETICULOCYTES   Dg Chest Portable 1 View  09/01/2011  *RADIOLOGY REPORT*  Clinical Data: Weakness.  Dizziness.  Cough.  Short of breath.  PORTABLE CHEST - 1 VIEW  Comparison: None.  Findings: Cardiomegaly is present.  There is diffuse bilateral airspace disease.  This has an almost uniform distribution.  The appearance is most compatible with cardiogenic pulmonary edema with multifocal pneumonia considered less likely. Monitoring leads are projected over the chest.  High-riding left humeral head is present, suggesting a chronic rotator cuff tear.  No effusion.  IMPRESSION: Cardiomegaly and diffuse bilateral airspace disease most compatible with CHF.  Multifocal pneumonia is in the differential considerations.  Original Report Authenticated By: Andreas Newport, M.D.     1. Acute blood loss anemia   2. GI bleed       MDM  The patient has new onset of profound anemia. He had a normal blood count as of several years ago. Patient states he' has only been taking an occasional Naprosyn. He denies having any blood in his stools or dark black stools.  His rectal exam showed normal color stool but it is guaiac positive. The most likely cause of his anemia is gastrointestinal bleeding. At this time he remains hemodynamically stable. I have ordered blood  transfusions. 4 units. He will be monitored closely. I will consult the medicine service for admission and further treatment and evaluation.   BMET reveals new onset renal failure as well. Suspect this is a result of the anemia but will need to investigate further. ?CHF vs pna.  Legrand Rams the former considering the renal falure.  Will add on bnp and troponin.  Celene Kras, MD 09/01/11 [phone]
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
## 2011-09-01 NOTE — ED Notes (Signed)
Pt still in triage.
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
## 2011-09-01 NOTE — ED Notes (Signed)
Patient is resting comfortably. 

## 2011-09-02 ENCOUNTER — Inpatient Hospital Stay (HOSPITAL_COMMUNITY): Payer: MEDICAID

## 2011-09-02 DIAGNOSIS — N179 Acute kidney failure, unspecified: Secondary | ICD-10-CM

## 2011-09-02 DIAGNOSIS — R0602 Shortness of breath: Secondary | ICD-10-CM

## 2011-09-02 LAB — CBC
HCT: 22.2 % — ABNORMAL LOW (ref 39.0–52.0)
Hemoglobin: 7.5 g/dL — ABNORMAL LOW (ref 13.0–17.0)
MCH: 28.7 pg (ref 26.0–34.0)
MCHC: 33.8 g/dL (ref 30.0–36.0)
RDW: 15.1 % (ref 11.5–15.5)

## 2011-09-02 LAB — RAPID URINE DRUG SCREEN, HOSP PERFORMED
Amphetamines: NOT DETECTED
Cocaine: POSITIVE — AB
Opiates: NOT DETECTED
Tetrahydrocannabinol: NOT DETECTED

## 2011-09-02 LAB — TSH: TSH: 1.93 u[IU]/mL (ref 0.350–4.500)

## 2011-09-02 LAB — FERRITIN: Ferritin: 79 ng/mL (ref 22–322)

## 2011-09-02 LAB — BASIC METABOLIC PANEL
BUN: 103 mg/dL — ABNORMAL HIGH (ref 6–23)
Creatinine, Ser: 12.04 mg/dL — ABNORMAL HIGH (ref 0.50–1.35)
GFR calc Af Amer: 5 mL/min — ABNORMAL LOW (ref >90)
GFR calc non Af Amer: 4 mL/min — ABNORMAL LOW (ref >90)
Glucose, Bld: 103 mg/dL — ABNORMAL HIGH (ref 70–99)
Potassium: 4.5 mEq/L (ref 3.5–5.1)

## 2011-09-02 LAB — HEPATITIS B SURFACE ANTIBODY,QUALITATIVE: Hep B S Ab: NEGATIVE

## 2011-09-02 LAB — HEPATITIS C ANTIBODY (REFLEX): HCV Ab: NEGATIVE

## 2011-09-02 LAB — IRON AND TIBC: Iron: 23 ug/dL — ABNORMAL LOW (ref 42–135)

## 2011-09-02 LAB — STREP PNEUMONIAE URINARY ANTIGEN: Strep Pneumo Urinary Antigen: NEGATIVE

## 2011-09-02 LAB — PATHOLOGIST SMEAR REVIEW

## 2011-09-02 LAB — HEMOGLOBIN A1C: Hgb A1c MFr Bld: 4.7 % (ref <5.7)

## 2011-09-02 LAB — CREATININE, URINE, RANDOM: Creatinine, Urine: 110.9 mg/dL

## 2011-09-02 NOTE — Progress Notes (Signed)
Patient ID: Alexander Grant, male   DOB: [birth year], 56 y.o.   MRN: [id number]  Gi is following- hgb up to 7.5 post transfusions. Creatinine 12 today-nephrology workup in progress.  Will look for nephrology to advise regarding timing of EGD/Colonoscopy during hospitalization- non urgent as no active bleeding.

## 2011-09-02 NOTE — Progress Notes (Signed)
Subjective:  Hgb up to the 7s after 4 units of blood.  Had over a liter of UOP.  U/S basically unremarkable.  Other serologic tests pending. He says he feels better Objective Vital signs in last 24 hours: Filed Vitals:   09/02/11 0500 09/02/11 0600 09/02/11 0700 09/02/11 0800  BP: 141/89 138/79 144/75 133/73  Pulse: 70 68 73 64  Temp:  98.2 F (36.8 C)  98.9 F (37.2 C)  TempSrc:  Oral  Oral  Resp: 25 30 24 26   Height:      Weight:      SpO2: 98% 96% 98% 87%   Weight change:   Intake/Output Summary (Last 24 hours) at 09/02/11 1219 Last data filed at 09/02/11 0800  Gross per 24 hour  Intake   2150 ml  Output   1475 ml  Net    675 ml   Labs: Basic Metabolic Panel:  Lab 09/02/11 1610 09/01/11 1243  NA 140 141  K 4.5 3.8  CL 105 102  CO2 19 20  GLUCOSE 103* 104*  BUN 103* 110*  CREATININE 12.04* 13.32*  CALCIUM 9.0 9.2  ALB -- --  PHOS -- --   Liver Function Tests:  Lab 09/01/11 1243  AST 11  ALT 6  ALKPHOS 61  BILITOT 0.8  PROT 7.0  ALBUMIN 3.3*   No results found for this basename: LIPASE:3,AMYLASE:3 in the last 168 hours No results found for this basename: AMMONIA:3 in the last 168 hours CBC:  Lab 09/02/11 0655 09/01/11 1243  WBC 6.8 6.0  NEUTROABS -- 4.2  HGB 7.5* 4.3*  HCT 22.2* 12.8*  MCV 85.1 87.7  PLT 194 217   Cardiac Enzymes:  Lab 09/01/11 1422  CKTOTAL 96  CKMB --  CKMBINDEX --  TROPONINI --   CBG: No results found for this basename: GLUCAP:5 in the last 168 hours  Iron Studies:  Basename 09/01/11 1243  IRON 23*  TIBC 304  TRANSFERRIN --  FERRITIN 79   Studies/Results: Ct Chest Wo Contrast  09/02/2011  *RADIOLOGY REPORT*  Clinical Data: Shortness of breath, cough.  Pulmonary infiltrates.  CT CHEST WITHOUT CONTRAST  Technique:  Multidetector CT imaging of the chest was performed following the standard protocol without IV contrast.  Comparison: Plain films 09/01/2011  Findings: Diffuse bilateral interstitial and ground-glass  opacities in the lungs.  This is slightly more pronounced in the mid and upper lung zones, but involves all lung zones.  Relative sparing in the bases.  There are small bilateral pleural effusions.  Heart is normal size.  Borderline sized mediastinal lymph nodes, none pathologically enlarged.  No definite hilar adenopathy.  No mediastinal adenopathy.  Coronary artery calcifications are noted, most pronounced in the left anterior descending artery.  15 mm low density nodule in the right thyroid lobe.  Chest wall soft tissues are unremarkable. Imaging into the upper abdomen shows no acute findings.  IMPRESSION: Interstitial and ground-glass opacities throughout the lungs with relative sparing of the bases.  This is nonspecific and could represent pulmonary edema, hemorrhage, infection or ARDS.  Small bilateral pleural effusions.  Coronary artery disease.  Right thyroid nodule.  This can be further characterized with elective thyroid ultrasound.  Original Report Authenticated By: Cyndie Chime, M.D.   US Renal  09/01/2011  *RADIOLOGY REPORT*  Clinical Data:  Acute renal failure  RENAL/URINARY TRACT ULTRASOUND COMPLETE  Comparison:  None.  Findings:  Right Kidney:  Normal in size and parenchymal echogenicity.  No evidence of mass or  hydronephrosis. The right kidney measures 10 cm in length.  Left Kidney:  Normal in size and parenchymal echogenicity.  No evidence of mass or hydronephrosis. The left kidney measures 10.8 cm in length.  Bladder:  Appears normal for degree of bladder distention.  IMPRESSION: Normal study.  Original Report Authenticated By: Alvino Blood Chest Portable 1 View  09/01/2011  *RADIOLOGY REPORT*  Clinical Data: Weakness.  Dizziness.  Cough.  Short of breath.  PORTABLE CHEST - 1 VIEW  Comparison: None.  Findings: Cardiomegaly is present.  There is diffuse bilateral airspace disease.  This has an almost uniform distribution.  The appearance is most compatible with cardiogenic pulmonary edema with  multifocal pneumonia considered less likely. Monitoring leads are projected over the chest.  High-riding left humeral head is present, suggesting a chronic rotator cuff tear.  No effusion.  IMPRESSION: Cardiomegaly and diffuse bilateral airspace disease most compatible with CHF.  Multifocal pneumonia is in the differential considerations.  Original Report Authenticated By: Andreas Newport, M.D.   Medications: Infusions:    . DISCONTD: sodium chloride 1,000 mL (09/01/11 1234)  . DISCONTD: pantoprozole (PROTONIX) infusion      Scheduled Medications:    . dextrose 5 % and 0.45% NaCl   Intravenous STAT  . metoprolol tartrate  50 mg Oral BID  . pantoprazole (PROTONIX) IV  40 mg Intravenous Q12H  . sodium chloride  3 mL Intravenous Q12H    have reviewed scheduled and prn medications.  Physical Exam: General: NAD, talkative.  They took off the O2 today, sats not what they should be and appears to have increased resp rate Heart: RRR Lungs: diffuse crackles Abdomen: soft, non tender Extremities: no edema whatsoever   I Assessment/ Plan: Pt is a 56 y.o. yo male who was admitted on 09/01/2011 with elevated creatinine of unknown etiology last known creatinine as high as 1.5 3 years ago.   Assessment/Plan: 1. Renal- acute versus subacute versus chronic kidney disease.  So far we have an ultrasound which is not that remarkable.  U/A shows granular casts so could be as simple as ATN, but U/ A also shows microscopic hematuria and mild proteinuria, raising the issue, along with CXR findings, for a GN, possibly a pulm renal type GN.  Creatinine is down slightly today, although not greatly.  Patient appears to be nonoliguric and there are no indications for HD.  Full serologic eval is pending.  May need kidney biopsy now end week if we fail to see continued improvement in renal function.   2.  Anemia- improved with transfusion.  Iron stores were low.  Had heme positive stools so will need work up at some  point.  I will add aranesp as well.  LDH is high, platelets seem within normal limilts.  3. HTN/Volume- difficult to assess his volume status.  He has no peripheral edema but has findings on CXR.  His BP is reasonable off of IVF so really is not appearing to be volume overloaded.  No lasix or IVF at this time.  On metoprolol.      Mateen Franssen A   09/02/2011,12:19 PM  LOS: 1 day

## 2011-09-02 NOTE — Progress Notes (Signed)
*  PRELIMINARY RESULTS* Echocardiogram 2D Echocardiogram has been performed.  Alexander Grant 09/02/2011, 3:25 PM

## 2011-09-02 NOTE — Progress Notes (Signed)
Called Lab staff twice to remind them of  the lab draws for pt to be drawn at 0600 am.  Lab staff stated "they will be there shortly". Pt scheduled for a CBC and a BMP this am.

## 2011-09-02 NOTE — Progress Notes (Signed)
CARE MANAGEMENT NOTE 09/02/2011  Patient:  Alexander Grant, Alexander Grant   Account Number:  [id number]  Date Initiated:  09/02/2011  Documentation initiated by:  Cay Kath  Subjective/Objective Assessment:   patient with gb 4.3, source of bleeding unknown, required 4 units prbc- hgb rose to 7.5.     Action/Plan:   lives at home   Anticipated DC Date:  09/05/2011   Anticipated DC Plan:  HOME/SELF CARE  In-house referral  NA      DC Planning Services  NA      [facility] Choice  NA   Choice offered to / List presented to:  NA   DME arranged  NA      DME agency  NA     HH arranged  NA      HH agency  NA   Status of service:  In process, will continue to follow Medicare Important Message given?  NA - LOS <3 / Initial given by admissions (If response is "NO", the following Medicare IM given date fields will be blank) Date Medicare IM given:   Date Additional Medicare IM given:    Discharge Disposition:    Per UR Regulation:  Reviewed for med. necessity/level of care/duration of stay  If discussed at Long Length of Stay Meetings, dates discussed:    Comments:  [id number] Earlene Plater, RN, BSN, CCM: CHART REVIEWED AND UPDATED. NO DISCHARGE NEEDS PRESENT AT THIS TIME. CASE MANAGEMENT [phone]

## 2011-09-02 NOTE — Progress Notes (Addendum)
[facility]                                                                                Patient Demographics  Alexander Grant, is a 56 y.o. male  RUE:[id number]  JYN:[id number]  DOB - 11/18/1955  Admit date - 09/01/2011  Admitting Physician Alexander Espy, MD  Outpatient Primary MD for the patient is No primary provider on file.  LOS - 1   Chief Complaint  Patient presents with  . Weakness        Assessment & Plan    1.Severe anemia with heme positive stools and acute renal failure - most likely his anemia is due to chronic blood loss vs ? CKD , patient denies any unintentional weight loss, he denies noticing any frank blood or dark stools , no nausea vomiting or heartburn , he does use NSAIDs infrequent , anemia panel is inconclusive although suggestive of some iron deficiency his MCV MCH and MCHC are normal, for now we'll withhold IV iron, he is stable post 4 units of packed RBC transfusions on 09/01/2011 and 09/02/2011, he is on IV PPI and GI is following the patient.     2.Renal failure - last creatinine in the system is under 1 which was 3 years ago , likely his acute renal failure although CKD cannot be ruled out since we have no lab work in the last 3 years, patient does have history of ureteric stents placed a few years ago due to kidney stone , his renal ultrasound is stable, his UA does show some protein and blood in it with stable CK levels, ANCA has been ordered by me do to chest x-ray and abnormal CT chest, further renal workup per nephrology who are following the patient, if creatinine does not improve or he becomes symptomatic he will need dialysis and transferred to [facility].    3.Exertional shortness of breath and elevated proBNP - this is most likely secondary to severe anemia versus cardiac decompensation from anemia, however in the light of abnormal chest x-ray CT chest noncontrast was ordered preliminary report per radiology is diffuse  interstitial infiltrates, patient does not appear to have an active infection, he is afebrile, no leukocytosis, minimal cough if any, as above have ordered ANA and ANCA, will request pulmonary to review the CT and give their opinion (fluid vs Auto Immune Lung injury) , oxygen as needed, nebulizers when necessary. Of note patient has good urine output and has no peripheral edema. Echo has been ordered.     4. HTN - place him on a moderate dose Lopressor with gentle lowering of blood pressure. Blood pressures are stable.     Code Status: Full  Family Communication: Discussed with the  Disposition Plan: To be decided    Procedures renal ultrasound, CT noncontrast chest    Consults  Renal, PCCM called today   Antibiotics  none   Time Spent in minutes   35   DVT Prophylaxis  SCDs    Susa Raring K M.D on 09/02/2011 at 8:51 AM  Between 7am to 7pm - Pager - [phone]  After 7pm go to [email or web address] - password [email or web address]  And look for  the night coverage person covering for me after hours  [facility] Office  [phone]    Subjective:   [name] today has, No headache, No chest pain, No abdominal pain - No Nausea, No new weakness tingling or numbness, No Cough - SOB.   Objective:   Filed Vitals:   09/02/11 0500 09/02/11 0600 09/02/11 0700 09/02/11 0800  BP: 141/89 138/79 144/75 133/73  Pulse: 70 68 73 64  Temp:  98.2 F (36.8 C)  98.9 F (37.2 C)  TempSrc:  Oral  Oral  Resp: 25 30 24 26   Height:      Weight:      SpO2: 98% 96% 98% 87%    Wt Readings from Last 3 Encounters:  09/01/11 86.183 kg (190 lb)     Intake/Output Summary (Last 24 hours) at 09/02/11 0851 Last data filed at 09/02/11 0800  Gross per 24 hour  Intake   2150 ml  Output   1475 ml  Net    675 ml    Exam Awake Alert, Oriented X 3, No new F.N deficits, Normal affect [place].AT,PERRAL Supple Neck,No JVD, No cervical lymphadenopathy appriciated.  Symmetrical Chest wall  movement, Good air movement bilaterally, few bibasilar rales today RRR,No Gallops,Rubs or new Murmurs, No Parasternal Heave +ve B.Sounds, Abd Soft, Non tender, No organomegaly appriciated, No rebound - guarding or rigidity. No Cyanosis, Clubbing or edema, No new Rash or bruise     Data Review   CBC  Lab 09/02/11 0655 09/01/11 1243  WBC 6.8 6.0  HGB 7.5* 4.3*  HCT 22.2* 12.8*  PLT 194 217  MCV 85.1 87.7  MCH 28.7 29.5  MCHC 33.8 33.6  RDW 15.1 13.8  LYMPHSABS -- 1.2  MONOABS -- 0.4  EOSABS -- 0.2  BASOSABS -- 0.0  BANDABS -- --    Chemistries   Lab 09/02/11 0655 09/01/11 1243  NA 140 141  K 4.5 3.8  CL 105 102  CO2 19 20  GLUCOSE 103* 104*  BUN 103* 110*  CREATININE 12.04* 13.32*  CALCIUM 9.0 9.2  MG -- --  AST -- 11  ALT -- 6  ALKPHOS -- 61  BILITOT -- 0.8   ------------------------------------------------------------------------------------------------------------------ estimated creatinine clearance is 7.9 ml/min (by C-G formula based on Cr of 12.04). ------------------------------------------------------------------------------------------------------------------ No results found for this basename: HGBA1C:2 in the last 72 hours ------------------------------------------------------------------------------------------------------------------ No results found for this basename: CHOL:2,HDL:2,LDLCALC:2,TRIG:2,CHOLHDL:2,LDLDIRECT:2 in the last 72 hours ------------------------------------------------------------------------------------------------------------------  [facility] 09/01/11 1422  TSH 1.930  T4TOTAL --  T3FREE --  THYROIDAB --   ------------------------------------------------------------------------------------------------------------------  Alvira Philips 09/01/11 1243  VITAMINB12 385  FOLATE 7.7  FERRITIN 79  TIBC 304  IRON 23*  RETICCTPCT 5.7*    Coagulation profile  Lab 09/01/11 1243  INR 1.22  PROTIME --    No results found for this  basename: DDIMER:2 in the last 72 hours  Cardiac Enzymes No results found for this basename: CK:3,CKMB:3,TROPONINI:3,MYOGLOBIN:3 in the last 168 hours ------------------------------------------------------------------------------------------------------------------ No components found with this basename: POCBNP:3  Micro Results Recent Results (from the past 240 hour(s))  MRSA PCR SCREENING     Status: Normal   Collection Time   09/01/11  5:43 PM      Component Value Range Status Comment   MRSA by PCR NEGATIVE  NEGATIVE Final     Radiology Reports US Renal  09/01/2011  *RADIOLOGY REPORT*  Clinical Data:  Acute renal failure  RENAL/URINARY TRACT ULTRASOUND COMPLETE  Comparison:  None.  Findings:  Right Kidney:  Normal in  size and parenchymal echogenicity.  No evidence of mass or hydronephrosis. The right kidney measures 10 cm in length.  Left Kidney:  Normal in size and parenchymal echogenicity.  No evidence of mass or hydronephrosis. The left kidney measures 10.8 cm in length.  Bladder:  Appears normal for degree of bladder distention.  IMPRESSION: Normal study.  Original Report Authenticated By: Alvino Blood Chest Portable 1 View  09/01/2011  *RADIOLOGY REPORT*  Clinical Data: Weakness.  Dizziness.  Cough.  Short of breath.  PORTABLE CHEST - 1 VIEW  Comparison: None.  Findings: Cardiomegaly is present.  There is diffuse bilateral airspace disease.  This has an almost uniform distribution.  The appearance is most compatible with cardiogenic pulmonary edema with multifocal pneumonia considered less likely. Monitoring leads are projected over the chest.  High-riding left humeral head is present, suggesting a chronic rotator cuff tear.  No effusion.  IMPRESSION: Cardiomegaly and diffuse bilateral airspace disease most compatible with CHF.  Multifocal pneumonia is in the differential considerations.  Original Report Authenticated By: Andreas Newport, M.D.    Scheduled Meds:   . dextrose 5 % and 0.45%  NaCl   Intravenous STAT  . metoprolol tartrate  50 mg Oral BID  . pantoprazole (PROTONIX) IV  40 mg Intravenous Q12H  . sodium chloride  3 mL Intravenous Q12H   Continuous Infusions:   . DISCONTD: sodium chloride 1,000 mL (09/01/11 1234)  . DISCONTD: pantoprozole (PROTONIX) infusion     PRN Meds:.albuterol, guaiFENesin-dextromethorphan, ondansetron (ZOFRAN) IV, ondansetron
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
## 2011-09-03 ENCOUNTER — Inpatient Hospital Stay (HOSPITAL_COMMUNITY): Payer: MEDICAID

## 2011-09-03 DIAGNOSIS — F1490 Cocaine use, unspecified, uncomplicated: Secondary | ICD-10-CM | POA: Diagnosis present

## 2011-09-03 DIAGNOSIS — F1410 Cocaine abuse, uncomplicated: Secondary | ICD-10-CM

## 2011-09-03 LAB — TYPE AND SCREEN
ABO/RH(D): A POS
Antibody Screen: NEGATIVE
Unit division: 0
Unit division: 0

## 2011-09-03 LAB — CBC
MCH: 29.4 pg (ref 26.0–34.0)
MCV: 86 fL (ref 78.0–100.0)
Platelets: 235 10*3/uL (ref 150–400)
RDW: 15.5 % (ref 11.5–15.5)
WBC: 7.8 10*3/uL (ref 4.0–10.5)

## 2011-09-03 LAB — BASIC METABOLIC PANEL
BUN: 98 mg/dL — ABNORMAL HIGH (ref 6–23)
Calcium: 9.5 mg/dL (ref 8.4–10.5)
Chloride: 102 mEq/L (ref 96–112)
Creatinine, Ser: 11.1 mg/dL — ABNORMAL HIGH (ref 0.50–1.35)
GFR calc non Af Amer: 4 mL/min — ABNORMAL LOW (ref >90)
Potassium: 4.4 mEq/L (ref 3.5–5.1)
Sodium: 137 mEq/L (ref 135–145)

## 2011-09-03 LAB — UIFE/LIGHT CHAINS/TP QN, 24-HR UR
Albumin, U: DETECTED
Alpha 1, Urine: DETECTED — AB
Alpha 2, Urine: DETECTED — AB
Beta, Urine: DETECTED — AB

## 2011-09-03 LAB — ANTI-DNA ANTIBODY, DOUBLE-STRANDED: ds DNA Ab: 13 IU/mL (ref <30)

## 2011-09-03 LAB — C3 COMPLEMENT: C3 Complement: 105 mg/dL (ref 90–180)

## 2011-09-03 LAB — ANTISTREPTOLYSIN O TITER: ASO: <25 IU/mL (ref <409)

## 2011-09-03 NOTE — Progress Notes (Signed)
Subjective:  Hgb now up to 8 after 4 units of blood.  Had 1600 of UOP.  Creatinine continues to trend down, is 11 today.   ASO, complements and hep serologies are negative.  ANA is positive and UPEP is suggestive of high free kappa chains with immunofixation pending.  UDS was positive for cocaine ? Objective Vital signs in last 24 hours: Filed Vitals:   09/03/11 0414 09/03/11 0800 09/03/11 1100 09/03/11 1200  BP: 138/71  152/80   Pulse: 60  58   Temp:  98.2 F (36.8 C)  98.6 F (37 C)  TempSrc:  Oral  Oral  Resp: 26  23   Height:      Weight:      SpO2: 96%  97%    Weight change:   Intake/Output Summary (Last 24 hours) at 09/03/11 1238 Last data filed at 09/03/11 1010  Gross per 24 hour  Intake    480 ml  Output   2050 ml  Net  -1570 ml   Labs: Basic Metabolic Panel:  Lab 09/03/11 1610 09/02/11 0655 09/01/11 1243  NA 137 140 141  K 4.4 4.5 3.8  CL 102 105 102  CO2 18* 19 20  GLUCOSE 101* 103* 104*  BUN 98* 103* 110*  CREATININE 11.10* 12.04* 13.32*  CALCIUM 9.5 9.0 9.2  ALB -- -- --  PHOS -- -- --   Liver Function Tests:  Lab 09/01/11 1243  AST 11  ALT 6  ALKPHOS 61  BILITOT 0.8  PROT 7.0  ALBUMIN 3.3*   No results found for this basename: LIPASE:3,AMYLASE:3 in the last 168 hours No results found for this basename: AMMONIA:3 in the last 168 hours CBC:  Lab 09/03/11 1105 09/02/11 0655 09/01/11 1243  WBC 7.8 6.8 6.0  NEUTROABS -- -- 4.2  HGB 8.4* 7.5* 4.3*  HCT 24.6* 22.2* 12.8*  MCV 86.0 85.1 87.7  PLT 235 194 217   Cardiac Enzymes:  Lab 09/01/11 1422  CKTOTAL 96  CKMB --  CKMBINDEX --  TROPONINI --   CBG: No results found for this basename: GLUCAP:5 in the last 168 hours  Iron Studies:   Basename 09/01/11 1243  IRON 23*  TIBC 304  TRANSFERRIN --  FERRITIN 79   Studies/Results: Dg Chest 2 View  09/03/2011  *RADIOLOGY REPORT*  Clinical Data: Cough, congestion  CHEST - 2 VIEW  Comparison: CT chest dated 09/02/2011  Findings: Multifocal  airspace opacities bilaterally, with relative sparing in the bases, grossly unchanged. Small bilateral pleural effusions are better visualized on prior CT.  Mild cardiomegaly.  The appearance is most suggestive of moderate interstitial edema, less likely multifocal pneumonia.  Mild degenerative changes of the visualized thoracolumbar spine.  IMPRESSION: Stable multifocal airspace opacities bilaterally, most suggestive of moderate interstitial edema, less likely multifocal pneumonia.  Original Report Authenticated By: Charline Bills, M.D.   Ct Chest Wo Contrast  09/02/2011  *RADIOLOGY REPORT*  Clinical Data: Shortness of breath, cough.  Pulmonary infiltrates.  CT CHEST WITHOUT CONTRAST  Technique:  Multidetector CT imaging of the chest was performed following the standard protocol without IV contrast.  Comparison: Plain films 09/01/2011  Findings: Diffuse bilateral interstitial and ground-glass opacities in the lungs.  This is slightly more pronounced in the mid and upper lung zones, but involves all lung zones.  Relative sparing in the bases.  There are small bilateral pleural effusions.  Heart is normal size.  Borderline sized mediastinal lymph nodes, none pathologically enlarged.  No definite hilar adenopathy.  No mediastinal adenopathy.  Coronary artery calcifications are noted, most pronounced in the left anterior descending artery.  15 mm low density nodule in the right thyroid lobe.  Chest wall soft tissues are unremarkable. Imaging into the upper abdomen shows no acute findings.  IMPRESSION: Interstitial and ground-glass opacities throughout the lungs with relative sparing of the bases.  This is nonspecific and could represent pulmonary edema, hemorrhage, infection or ARDS.  Small bilateral pleural effusions.  Coronary artery disease.  Right thyroid nodule.  This can be further characterized with elective thyroid ultrasound.  Original Report Authenticated By: Cyndie Chime, M.D.   US Renal  09/01/2011   *RADIOLOGY REPORT*  Clinical Data:  Acute renal failure  RENAL/URINARY TRACT ULTRASOUND COMPLETE  Comparison:  None.  Findings:  Right Kidney:  Normal in size and parenchymal echogenicity.  No evidence of mass or hydronephrosis. The right kidney measures 10 cm in length.  Left Kidney:  Normal in size and parenchymal echogenicity.  No evidence of mass or hydronephrosis. The left kidney measures 10.8 cm in length.  Bladder:  Appears normal for degree of bladder distention.  IMPRESSION: Normal study.  Original Report Authenticated By: Alvino Blood Chest Portable 1 View  09/01/2011  *RADIOLOGY REPORT*  Clinical Data: Weakness.  Dizziness.  Cough.  Short of breath.  PORTABLE CHEST - 1 VIEW  Comparison: None.  Findings: Cardiomegaly is present.  There is diffuse bilateral airspace disease.  This has an almost uniform distribution.  The appearance is most compatible with cardiogenic pulmonary edema with multifocal pneumonia considered less likely. Monitoring leads are projected over the chest.  High-riding left humeral head is present, suggesting a chronic rotator cuff tear.  No effusion.  IMPRESSION: Cardiomegaly and diffuse bilateral airspace disease most compatible with CHF.  Multifocal pneumonia is in the differential considerations.  Original Report Authenticated By: Andreas Newport, M.D.   Medications: Infusions:    Scheduled Medications:    . dextrose 5 % and 0.45% NaCl   Intravenous STAT  . metoprolol tartrate  50 mg Oral BID  . pantoprazole (PROTONIX) IV  40 mg Intravenous Q12H  . sodium chloride  3 mL Intravenous Q12H    have reviewed scheduled and prn medications.  Physical Exam: General: NAD, talkative.  On nasal cannula O2  increased resp rate Heart: RRR Lungs: diffuse crackles Abdomen: soft, non tender Extremities: no edema whatsoever   I Assessment/ Plan: Pt is a 56 y.o. yo male who was admitted on 09/01/2011 with elevated creatinine of unknown etiology last known creatinine as high as  1.5 3 years ago.   Assessment/Plan: 1. Renal- acute versus subacute versus chronic kidney disease.  So far we have an ultrasound which is not that remarkable.  U/A shows granular casts so could be as simple as ATN which would improve on its own, but U/ A also shows microscopic hematuria and mild proteinuria, raising the issue, along with CXR findings, for a GN, possibly a pulm renal type GN.  Creatinine is down again today.   Patient appears to be nonoliguric and there are no indications for HD.  Full serologic eval is pending, positive findings include ANA and also UDS pos for cocaine and a not completely appearing UPEP.  May need kidney biopsy now end week if we fail to see continued improvement in renal function.   2.  Anemia- improved with transfusion.  Iron stores were low.  Had heme positive stools so will need work up at some point.  I have  on aranesp as well.  LDH is high, haptoglobin also high, platelets seem within normal limilts.  3. HTN/Volume- difficult to assess his volume status.  He has no peripheral edema but has findings on CXR.  His BP is reasonable off of IVF so really is not appearing to be volume overloaded.  No lasix or IVF at this time.  On metoprolol.      Yvette Roark A   09/03/2011,12:38 PM  LOS: 2 days

## 2011-09-03 NOTE — Progress Notes (Signed)
Patient ID: Alexander Grant, male   DOB: March 17, 1955, 56 y.o.   MRN: [id number]  [facility] PROGRESS NOTE  Alexander Grant QMV:[id number] DOB: 1955/04/06 DOA: 09/01/2011 PCP: No primary provider on file.  Brief narrative: Pt is 56 yo male who was admitted 09/01/2011 with generalized weakness and was found to be severely anemic with Hg 4.3 and in acute renal failure with Cr 13, has required 4 units of blood transfusion.  Principal Problem:  *Acute blood loss anemia - no active bleeding noted per pt or while in the hospital  - pt reports no blood in stool and has regular bowel movements - will continue to follow up on daily CBC - appreciate GI input  Active Problems:  GI bleed - unclear etiology, GI following, no clear source identified at this point - pt is hemodynamically stable at this point - CBC in AM   Acute renal failure - possibly related to ATN but ? Chronic component and ? Cocaine induced  - creatinine is slowly trending down - nephrology following and input is certainly appreciated - pt maintaining good urine output - BMP in AM - Autoimmune and vasculitic work up to date negative and include complement levels, ASO; other blood tests pending   Exertional shortness of breath - likely multifactorial in etiology and secondary to acute blood loss anemia and CHF - will hold off on IVF at this time but encourage PO intake - pt maintaining oxygen saturations > 95% on RA - monitor vitals per floor protocol   Cocaine use - pt denies use - will continue to address with pt  Consultants:  PCCM  Nephrology  GI  Procedures/Studies: Dg Chest 2 View 09/03/2011   Stable multifocal airspace opacities bilaterally, most suggestive of moderate interstitial edema, less likely multifocal pneumonia.    Ct Chest Wo Contrast  09/02/2011     Interstitial and ground-glass opacities throughout the lungs with relative sparing of the bases.    This is nonspecific and could  represent pulmonary edema, hemorrhage, infection or ARDS.    Small bilateral pleural effusions.  Coronary artery disease.  Right thyroid nodule.  This can be further characterized with elective thyroid ultrasound.    US Renal  09/01/2011    Normal study.    Dg Chest Portable 1 View  09/01/2011    Cardiomegaly and diffuse bilateral airspace disease most compatible with CHF.    Multifocal pneumonia is in the differential considerations.    Antibiotics:  None  Code Status: Full Family Communication: Pt at bedside Disposition Plan: Transfer to telemetry today  HPI/Subjective: No events overnight. Pt reports feeling better, denies shortness of breath, no chest pain, no abdominal or urinary concerns.   Objective: Filed Vitals:   09/03/11 0103 09/03/11 0400 09/03/11 0414 09/03/11 0800  BP: 133/75  138/71   Pulse: 59  60   Temp: 98.3 F (36.8 C) 98.7 F (37.1 C)  98.2 F (36.8 C)  TempSrc: Oral Oral  Oral  Resp: 26  26   Height:      Weight:      SpO2: 95%  96%     Intake/Output Summary (Last 24 hours) at 09/03/11 1122 Last data filed at 09/03/11 0800  Gross per 24 hour  Intake    480 ml  Output   1800 ml  Net  -1320 ml    Exam:   General:  Pt is alert, follows commands appropriately, not in acute distress  Cardiovascular: Regular rate and rhythm, S1/S2, no  murmurs, no rubs, no gallops  Respiratory: Clear to auscultation bilaterally, no wheezing, mild bibasilar crackles, no rhonchi  Abdomen: Soft, non tender, non distended, bowel sounds present, no guarding  Extremities: No edema, pulses DP and PT palpable bilaterally  Neuro: Grossly nonfocal  Data Reviewed: Basic Metabolic Panel:  Lab 09/02/11 1610 09/01/11 1243  NA 140 141  K 4.5 3.8  CL 105 102  CO2 19 20  GLUCOSE 103* 104*  BUN 103* 110*  CREATININE 12.04* 13.32*  CALCIUM 9.0 9.2  MG -- --  PHOS -- --   Liver Function Tests:  Lab 09/01/11 1243  AST 11  ALT 6  ALKPHOS 61  BILITOT 0.8    PROT 7.0  ALBUMIN 3.3*   CBC:  Lab 09/02/11 0655 09/01/11 1243  WBC 6.8 6.0  NEUTROABS -- 4.2  HGB 7.5* 4.3*  HCT 22.2* 12.8*  MCV 85.1 87.7  PLT 194 217   Cardiac Enzymes:  Lab 09/01/11 1422  CKTOTAL 96  CKMB --  CKMBINDEX --  TROPONINI --     Recent Results (from the past 240 hour(s))  MRSA PCR SCREENING     Status: Normal   Collection Time   09/01/11  5:43 PM      Component Value Range Status Comment   MRSA by PCR NEGATIVE  NEGATIVE Final      Scheduled Meds:   . dextrose 5 % and 0.45% NaCl   Intravenous STAT  . metoprolol tartrate  50 mg Oral BID  . pantoprazole (PROTONIX) IV  40 mg Intravenous Q12H  . sodium chloride  3 mL Intravenous Q12H   Continuous Infusions:    Debbora Presto, MD  [facility] Pager [phone]  If 7PM-7AM, please contact night-coverage [email or web address] Password [email or web address] 09/03/2011, 11:22 AM   LOS: 2 days

## 2011-09-03 NOTE — Progress Notes (Signed)
   Patient ID: Alexander Grant, male   DOB: 03-17-1955, 56 y.o.   MRN: [id number]   Gi workup on hold, pt has diffuse pulmonary infiltrates on CT scan.  Creat pending this am- nephrology workup in progress Interesting drug  screen + cocaine- pt adamantly denied on admit- ? could this all be drug induced toxicity.

## 2011-09-03 NOTE — Progress Notes (Signed)
Name: Alexander Grant MRN: [id number] DOB: 09-01-1955    LOS: 2  Reason for consult Diffuse pulmonary infiltrates.   Consulting MD singh  History of Present Illness:  56 y.o. male, who is essentially healthy. Denied any active medical problems, does use NSAIDs on an infrequent basis.Admitted on 8/5 after while at work  his colleagues told him that he looked pale, he also complained of some exertional shortness of breath ongoing for the last few weeks, as well as generalized weakness. In the ER patient was found to be Hemoccult positive, hgb of 4, and  creatinine of 13. He had a CT of chest obtained to further evaluate dyspnea, this demonstrated diffuse bilateral pulmonary infiltrates. Because of this abnormality pulmonary was asked to evaluate.   Lines / Drains:  Cultures: Urine strep 8/6>>>negative Hep B surface ant 8/6: negative Hep B core 8/6: neg Hep C AB: neg  Antibiotics:   Tests / Events: CT chest 8/6 Interstitial and ground-glass opacities throughout the lungs with relative sparing of the bases. This is nonspecific and could represent pulmonary edema, hemorrhage, infection or ARDS, inflammatory process   Echo 8/5>>>There was moderate concentric hypertrophy. Systolic function was normal. The estimated ejection fraction was in the range of 60% to 65%. Wall motion was normal; there were no regional wall motion abnormalities. Doppler parameters are consistent with abnormal left ventricular relaxation (grade 1 diastolic dysfunction)  ANA 8/6>>> Double stranded anti-DNA AB 8/6>>> antistrepolysin 8/6: <25 C3 8/6: 105 C4 8/6: 17 Total complement 8/6>>> GBM ab 8/6>>> HIV 8/6>>>non-reactive ANCA 8/6>>> Urine drug 8/6>>>cocaine LDH 8/6: 261 Haptoglobin 8/6: 297  Subjective Still weak and not to baseline.  Vital Signs: BP 138/71  Pulse 60  Temp 98.7 F (37.1 C) (Oral)  Resp 26  Ht 6\' 2"  (1.88 m)  Wt 86.183 kg (190 lb)  BMI 24.39 kg/m2  SpO2 96%      2  liters   Intake/Output Summary (Last 24 hours) at 09/03/11 0901 Last data filed at 09/03/11 0600  Gross per 24 hour  Intake    480 ml  Output   1225 ml  Net   -745 ml    Physical Examination: General:  Awake, and alert. No distress Neuro:  No focal def HEENT:  [place], no JVD Cardiovascular:  rrr Lungs:  Crackles in bases Abdomen:  Soft, Non-tender Musculoskeletal:  intact Skin:  intact  Ventilator settings:    Labs and Imaging:   Lab 09/02/11 0655 09/01/11 1243  NA 140 141  K 4.5 3.8  CL 105 102  CO2 19 20  BUN 103* 110*  CREATININE 12.04* 13.32*  GLUCOSE 103* 104*    Lab 09/02/11 0655 09/01/11 1243  HGB 7.5* 4.3*  HCT 22.2* 12.8*  WBC 6.8 6.0  PLT 194 217      Lab 09/01/11 1243  ALT 6  AST 11  GGT --  ALKPHOS 61  BILITOT 0.8   Erythrocyte Sedimentation Rate  No results found for this basename: esrsedrate      CT chest 8/6 diffuse pulmonary infiltrates.   Assessment and Plan: Diffuse bilateral Pulmonary infiltrates of uncertain etiology. Very interesting case. Diff dx: decompensated heart failure in the setting of profound anemia. Volume overload in setting of renal failure, and transient low CO state. Drug induced pneumonitis (+ cocaine on UDS), auto-immune pneumonitis/GN (Wegener's, Goodpasture's, ANCA pending), pulmonary vasculitis but this seems unlikely in the given the absence of hemoptysis. Doubt infection.   Recommendation -rx anemia -rx renal  failure -agree with holding off abx for now -check ESR -agree w/ auto-immune panel eval. Would not jump to bronchoscopy or VATS at this point; given positive UPEP and ANA, renal bx may be more revealing -serial cxr  Anemia  Lab 09/02/11 0655 09/01/11 1243  HGB 7.5* 4.3*   Suspect that this is due to slow GIB + hemoccult stool. Also wonder about hemolytic anemia. His hgb has improved from 4.3 to 7.5 after transfusion.  Smear comment by pathologist: normocytic anemia 8/6.  plan -further work up per  GI medicine  -f/u cbc  Renal failure/AKI. Likely from blood loss and hypo-perfusion. Considering also a GN or pulmonary/renal syndrome. Could be related to cocaine use. Renal is following. Scr improving. No indication for HD at this point. Hopeful that his creatinine should make a full recovery Recent Labs  [facility] 09/02/11 0655 09/01/11 1243   CREATININE 12.04* 13.32*  plan -per renal; consider renal bx depending on auto-immune w/u -await ANCA results -f/u cmp  Best practices / Disposition: -->ICU status under [facility] -->full code -->scd for DVT Px -->Protonix for GI Px  BABCOCK,PETE 09/03/2011, 9:01 AM   Attending Addendum:  I have seen the patient, discussed the issues, test results and plans with [name], NP. I agree with the Assessment and Plans as ammended above.   [name], MD, PhD 09/03/2011, 1:02 PM [facility] [phone] or if no answer [phone]

## 2011-09-04 ENCOUNTER — Encounter (HOSPITAL_COMMUNITY): Payer: Self-pay | Admitting: *Deleted

## 2011-09-04 LAB — PROTEIN ELECTROPHORESIS, SERUM
Alpha-2-Globulin: 14 % — ABNORMAL HIGH (ref 7.1–11.8)
Beta Globulin: 5.8 % (ref 4.7–7.2)
Gamma Globulin: 16 % (ref 11.1–18.8)
M-Spike, %: NOT DETECTED g/dL

## 2011-09-04 LAB — RENAL FUNCTION PANEL
Albumin: 2.6 g/dL — ABNORMAL LOW (ref 3.5–5.2)
BUN: 100 mg/dL — ABNORMAL HIGH (ref 6–23)
CO2: 18 mEq/L — ABNORMAL LOW (ref 19–32)
Chloride: 104 mEq/L (ref 96–112)
Creatinine, Ser: 10.94 mg/dL — ABNORMAL HIGH (ref 0.50–1.35)
GFR calc Af Amer: 5 mL/min — ABNORMAL LOW (ref >90)
GFR calc non Af Amer: 5 mL/min — ABNORMAL LOW (ref >90)
Potassium: 4.4 mEq/L (ref 3.5–5.1)

## 2011-09-04 LAB — CBC
MCH: 28.3 pg (ref 26.0–34.0)
MCV: 84.9 fL (ref 78.0–100.0)
Platelets: 242 10*3/uL (ref 150–400)
RDW: 15 % (ref 11.5–15.5)

## 2011-09-04 LAB — ANCA SCREEN W REFLEX TITER: p-ANCA Screen: POSITIVE — AB

## 2011-09-04 MED ORDER — LORAZEPAM 0.5 MG PO TABS
0.5000 mg | ORAL_TABLET | Freq: Every evening | ORAL | Status: DC | PRN
  Administered 2011-09-05: 0.5 mg via ORAL
  Filled 2011-09-04: qty 1

## 2011-09-04 MED ORDER — HYDRALAZINE HCL 25 MG PO TABS
25.0000 mg | ORAL_TABLET | Freq: Three times a day (TID) | ORAL | Status: DC
  Administered 2011-09-04 – 2011-09-12 (×24): 25 mg via ORAL
  Filled 2011-09-04 (×29): qty 1

## 2011-09-04 MED ORDER — CYCLOPHOSPHAMIDE 50 MG PO TABS
150.0000 mg | ORAL_TABLET | Freq: Every day | ORAL | Status: DC
  Administered 2011-09-05 – 2011-09-13 (×9): 150 mg via ORAL
  Filled 2011-09-04 (×11): qty 3

## 2011-09-04 MED ORDER — SODIUM CHLORIDE 0.9 % IV SOLN
1000.0000 mg | INTRAVENOUS | Status: AC
  Administered 2011-09-04 – 2011-09-06 (×3): 1000 mg via INTRAVENOUS
  Filled 2011-09-04 (×4): qty 8

## 2011-09-04 NOTE — Progress Notes (Signed)
Pt examined and chart reviewed. [name] was transferred to [facility] from [facility] this evening. 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 Undergoing renal biopsy in AM. Patient feeling much better overall, reports a non productive cough at times. Has many questions as to what has caused his renal failure and anemia but understands that biopsy will provide further information.   VSS. Skin pale, dry, warm. Lungs CTAB. Heart RRR, no M/R/G. Abd soft, NT. No LE edema. Labs reviewed.   NPO after midnight for renal biopsy in AM. Labs ordered for AM.
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
## 2011-09-04 NOTE — Progress Notes (Signed)
Name: Alexander Grant MRN: [id number] DOB: 10-11-1955    LOS: 3  Reason for consult Diffuse pulmonary infiltrates.   Consulting MD singh  History of Present Illness:  56 y.o. male, who is essentially healthy. Denied any active medical problems, does use NSAIDs on an infrequent basis.Admitted on 8/5 after while at work  his colleagues told him that he looked pale, he also complained of some exertional shortness of breath ongoing for the last few weeks, as well as generalized weakness. In the ER patient was found to be Hemoccult positive, hgb of 4, and  creatinine of 13. He had a CT of chest obtained to further evaluate dyspnea, this demonstrated diffuse bilateral pulmonary infiltrates. Because of this abnormality pulmonary was asked to evaluate.   Lines / Drains:  Cultures: Urine strep 8/6>>>negative Hep B surface ant 8/6: negative Hep B core 8/6: neg Hep C AB: neg  Antibiotics:   Tests / Events: CT chest 8/6 Interstitial and ground-glass opacities throughout the lungs with relative sparing of the bases. This is nonspecific and could represent pulmonary edema, hemorrhage, infection or ARDS, inflammatory process   Echo 8/5>>>There was moderate concentric hypertrophy. Systolic function was normal. The estimated ejection fraction was in the range of 60% to 65%. Wall motion was normal; there were no regional wall motion abnormalities. Doppler parameters are consistent with abnormal left ventricular relaxation (grade 1 diastolic dysfunction)  ANA 8/6>>>positive  Double stranded anti-DNA AB 8/6>>> antistrepolysin 8/6: <25 C3 8/6: 105 C4 8/6: 17 Total complement 8/6>>> GBM ab 8/6>>> HIV 8/6>>>non-reactive ANCA 8/6>>> Urine drug 8/6>>>cocaine LDH 8/6: 261 Haptoglobin 8/6: 297 Mpo/pr-3 (anca) Myeloperoxidase Abs 68 (H) <20 AU/mL Serine Protease 3 748 (H)   Subjective Feeling a little better  Vital Signs: BP 146/73  Pulse 68  Temp 98.2 F (36.8 C) (Oral)  Resp 25   Ht 6\' 2"  (1.88 m)  Wt 91.264 kg (201 lb 3.2 oz)  BMI 25.83 kg/m2  SpO2 94% FIO2 2lpm         Intake/Output Summary (Last 24 hours) at 09/04/11 1225 Last data filed at 09/04/11 1000  Gross per 24 hour  Intake     20 ml  Output   1600 ml  Net  -1580 ml    Physical Examination: General:  Awake, and alert. No distress Neuro:  No focal def HEENT:  [place], no JVD Cardiovascular:  rrr Lungs:  Crackles in bases Abdomen:  Soft, Non-tender Musculoskeletal:  intact Skin:  intact       Labs and Imaging:   Lab 09/04/11 0400 09/03/11 1105 09/02/11 0655  NA 138 137 140  K 4.4 4.4 4.5  CL 104 102 105  CO2 18* 18* 19  BUN 100* 98* 103*  CREATININE 10.94* 11.10* 12.04*  GLUCOSE 110* 101* 103*    Lab 09/04/11 0400 09/03/11 1105 09/02/11 0655  HGB 7.3* 8.4* 7.5*  HCT 21.9* 24.6* 22.2*  WBC 7.2 7.8 6.8  PLT 242 235 194      Lab 09/01/11 1243  ALT 6  AST 11  GGT --  ALKPHOS 61  BILITOT 0.8   Erythrocyte Sedimentation Rate     Component Value Date/Time   ESRSEDRATE 100* 09/03/2011 1105      CT chest 8/6 diffuse pulmonary infiltrates.  PCXR: bilateral pulmonary infiltrates. No sig change  Assessment and Plan: Diffuse bilateral Pulmonary infiltrates of uncertain etiology.  His ANA and anti-PR3 are both elevated raising concern for GN and pulmonary renal syndrome such as  Wegener's. Cytoxan and High dose steroids started. Possibility of Cocaine induced pneumonitis seeming less likely now.   Recommendation -agree needs renal bx and xfer to [facility].  -agree w/ cytoxan and steroids.  -serial cxr  Anemia  Lab 09/04/11 0400 09/03/11 1105 09/02/11 0655  HGB 7.3* 8.4* 7.5*   Suspect that this is due to slow GIB + hemoccult stool. His hgb has improved from 4.3 to 7.5 after transfusion.  Smear comment by pathologist: normocytic anemia 8/6.  plan -further work up per GI medicine  -f/u cbc  Renal failure/AKI. 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 Could be related to  cocaine use. Renal is following. Scr improving. No indication for HD at this point.     Recent Labs  Basename 09/04/11 0400 09/03/11 1105 09/02/11 0655   CREATININE 10.94* 11.10* 12.04*  plan -per renal; consider renal bx -cont cytoxan and steroids.  -await ANCA results -f/u cmp  Best practices / Disposition: -->floor status under triad -->full code -->scd for DVT Px -->Protonix for GI Px  BABCOCK,PETE 09/04/2011, 12:25 PM  Pt independently  seen and examined and available cxr's reviewed and I agree with above findings/ imp/ plan   Christinia Gully, MD [facility] [phone]

## 2011-09-04 NOTE — Progress Notes (Signed)
Patient ID: Alexander Grant, male   DOB: 1955/07/31, 56 y.o.   MRN: [id number]  [facility] PROGRESS NOTE  Alexander Grant LHT:[id number] DOB: 1955-07-15 DOA: 09/01/2011 PCP: No primary provider on file.  Brief narrative:  Pt is 56 yo male who was admitted 09/01/2011 with generalized weakness and was found to be severely anemic with Hg 4.3 and in acute renal failure with Cr 13, has required 4 units of blood transfusion. 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 Nephrology team asked to transfer to [facility] for further evaluation and management.   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 - no significant change in creatinine since yesterday  - nephrology following and input is certainly appreciated  - will transfer the pt to [facility] for further evaluation and management  - plan is to proceed with bio - pt was already started on Solumedrol and Cytoxan per nephrology team   Exertional shortness of breath  - 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 Systolic function normal.   The estimated ejection fraction 60% to 65%.   Wall motion  was normal; there were no regional wall motion abnormalities.   Doppler parameters consistent with grade 1 diastolic dysfunction.  Dg Chest 2 View  09/03/2011  Stable multifocal airspace opacities bilaterally, most suggestive of moderate interstitial edema, less likely multifocal pneumonia.   Ct Chest Wo Contrast  09/02/2011  Interstitial and ground-glass opacities throughout the lungs with relative sparing of the bases.  This is nonspecific and could represent pulmonary edema, hemorrhage, infection or ARDS.  Small bilateral pleural effusions. Coronary artery disease. Right thyroid nodule. This can be further characterized with elective thyroid ultrasound.   US Renal  09/01/2011  Normal study.   Dg Chest Portable 1 View  09/01/2011  Cardiomegaly and diffuse bilateral airspace disease most compatible with CHF.  Multifocal pneumonia is in the differential considerations.   ANA 8/6 --> positive  Double stranded anti-DNA AB 8/6 --> <1 antistrepolysin 8/6 --> <25  C3 8/6 --> 105  C4 8/6 --> 17  GBM ab 8/6 --> < 13 HIV 8/6 --> non-reactive  ANCA 8/6 -- > pending Urine drug 8/6 --> cocaine positive Haptoglobin 8/6 --> 297  Mpo/pr-3 (anca) Myeloperoxidase Abs 68 (H) <20 AU/mL Serine Protease 3 748 (H)   Cultures:  Urine strep 08/06 --> negative  Hep B surface ant 08/06 --> negative  Hep B core 08/06 -->neg  Hep C AB 08/06 -->neg   Antibiotics:  None  Code Status: Full  Family Communication: Pt at  bedside  Disposition Plan: Transfer to telemetry today    HPI/Subjective: No events overnight. Pt reports shortness of breath, mostly exertional.   Objective: Filed Vitals:   09/04/11 0000 09/04/11 0157 09/04/11 0330 09/04/11 0454  BP:  179/93 176/89 146/73  Pulse: 65 68 84 68  Temp: 98.9 F (37.2 C) 98.7 F (37.1 C)  98.2 F (36.8 C)  TempSrc: Oral Oral  Oral  Resp: 32 20  25  Height:  6\' 2"  (1.88 m)    Weight: 198 lb 10.2 oz (90.1 kg) 201 lb 3.2 oz (91.264 kg)    SpO2: 98%  95%  94%    Intake/Output Summary (Last 24 hours) at 09/04/11 1316 Last data filed at 09/04/11 1000  Gross per 24 hour  Intake     20 ml  Output   1600 ml  Net  -1580 ml    Exam:   General:  Pt is alert, follows commands appropriately, not in acute distress  Cardiovascular: Regular rate and rhythm, S1/S2, no murmurs, no rubs, no gallops  Respiratory: No wheezing, bibasilar crackles, no rhonchi  Abdomen: Soft, non tender, non distended, bowel sounds present, no guarding  Extremities: No edema, pulses DP and PT palpable bilaterally  Neuro: Grossly nonfocal  Data Reviewed: Basic Metabolic Panel:  Lab 78/24/23 0400 09/03/11 1105 09/02/11 0655 09/01/11 1243  NA 138 137 140 141  K 4.4 4.4 4.5 3.8  CL 104 102 105 102  CO2 18* 18* 19 20  GLUCOSE 110* 101* 103* 104*  BUN 100* 98* 103* 110*  CREATININE 10.94* 11.10* 12.04* 13.32*  CALCIUM 8.9 9.5 9.0 9.2  MG -- -- -- --  PHOS 6.3* -- -- --   Liver Function Tests:  Lab 09/04/11 0400 09/01/11 1243  AST -- 11  ALT -- 6  ALKPHOS -- 61  BILITOT -- 0.8  PROT -- 7.0  ALBUMIN 2.6* 3.3*   CBC:  Lab 09/04/11 0400 09/03/11 1105 09/02/11 0655 09/01/11 1243  WBC 7.2 7.8 6.8 6.0  NEUTROABS -- -- -- 4.2  HGB 7.3* 8.4* 7.5* 4.3*  HCT 21.9* 24.6* 22.2* 12.8*  MCV 84.9 86.0 85.1 87.7  PLT 242 235 194 217   Cardiac Enzymes:  Lab 09/01/11 1422  CKTOTAL 96  CKMB --  CKMBINDEX --  TROPONINI --    Recent Results (from the past 240 hour(s))  MRSA PCR SCREENING     Status: Normal   Collection Time   09/01/11  5:43 PM      Component Value Range Status Comment   MRSA by PCR NEGATIVE  NEGATIVE Final    Scheduled Meds:   . cyclophosphamide  150 mg Oral QAC breakfast  . hydrALAZINE  25 mg Oral Q8H  . methylPREDNISolone inj  1,000 mg Intravenous Q24H  . metoprolol tartrate  50 mg Oral BID  . pantoprazole IV  40 mg Intravenous Q12H   Continuous Infusions:    Faye Ramsay, MD  [facility] Pager  [phone]  If 7PM-7AM, please contact night-coverage [email or web address] Password [email or web address] 09/04/2011, 1:16 PM   LOS: 3 days

## 2011-09-04 NOTE — Progress Notes (Signed)
Subjective:  Is having good UOP, says that he is feeling better. Anti PR3 very positive.  ANCA is pending.  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  Kidney function not really changed  Objective Vital signs in last 24 hours: Filed Vitals:   09/04/11 0000 09/04/11 0157 09/04/11 0330 09/04/11 0454  BP:  179/93 176/89 146/73  Pulse: 65 68 84 68  Temp: 98.9 F (37.2 C) 98.7 F (37.1 C)  98.2 F (36.8 C)  TempSrc: Oral Oral  Oral  Resp: 32 20  25  Height:  6\' 2"  (1.88 m)    Weight: 90.1 kg (198 lb 10.2 oz) 91.264 kg (201 lb 3.2 oz)    SpO2: 98% 95%  94%   Weight change:   Intake/Output Summary (Last 24 hours) at 09/04/11 1201 Last data filed at 09/04/11 1000  Gross per 24 hour  Intake     20 ml  Output   1600 ml  Net  -1580 ml   Labs: Basic Metabolic Panel:  Lab 09/04/11 7846 09/03/11 1105 09/02/11 0655  NA 138 137 140  K 4.4 4.4 4.5  CL 104 102 105  CO2 18* 18* 19  GLUCOSE 110* 101* 103*  BUN 100* 98* 103*  CREATININE 10.94* 11.10* 12.04*  CALCIUM 8.9 9.5 9.0  ALB -- -- --  PHOS 6.3* -- --   Liver Function Tests:  Lab 09/04/11 0400 09/01/11 1243  AST -- 11  ALT -- 6  ALKPHOS -- 61  BILITOT -- 0.8  PROT -- 7.0  ALBUMIN 2.6* 3.3*   No results found for this basename: LIPASE:3,AMYLASE:3 in the last 168 hours No results found for this basename: AMMONIA:3 in the last 168 hours CBC:  Lab 09/04/11 0400 09/03/11 1105 09/02/11 0655 09/01/11 1243  WBC 7.2 7.8 6.8 --  NEUTROABS -- -- -- 4.2  HGB 7.3* 8.4* 7.5* --  HCT 21.9* 24.6* 22.2* --  MCV 84.9 86.0 85.1 87.7  PLT 242 235 194 --   Cardiac Enzymes:  Lab 09/01/11 1422  CKTOTAL 96  CKMB --  CKMBINDEX --  TROPONINI --   CBG: No results found for this basename: GLUCAP:5 in the last 168 hours  Iron Studies:   Basename 09/01/11 1243  IRON 23*  TIBC 304  TRANSFERRIN --  FERRITIN 79     Studies/Results: Dg Chest 2 View  09/03/2011  *RADIOLOGY REPORT*  Clinical Data: Cough, congestion  CHEST - 2 VIEW  Comparison: CT chest dated 09/02/2011  Findings: Multifocal airspace opacities bilaterally, with relative sparing in the bases, grossly unchanged. Small bilateral pleural effusions are better visualized on prior CT.  Mild cardiomegaly.  The appearance is most suggestive of moderate interstitial edema, less likely multifocal pneumonia.  Mild degenerative changes of the visualized thoracolumbar spine.  IMPRESSION: Stable multifocal airspace opacities bilaterally, most suggestive of moderate interstitial edema, less likely multifocal pneumonia.  Original Report Authenticated By: Charline Bills, M.D.   Medications: Infusions:    Scheduled Medications:    . metoprolol tartrate  50 mg Oral BID  . pantoprazole (PROTONIX) IV  40 mg Intravenous Q12H  . sodium chloride  3 mL Intravenous Q12H    have reviewed scheduled and prn medications.  Physical Exam: General: NAD, talkative.  On nasal cannula O2  increased resp rate Heart: RRR Lungs: diffuse crackles Abdomen: soft, non tender Extremities: no edema whatsoever   I Assessment/ Plan: Pt is  a 56 y.o. yo male who was admitted on 09/01/2011 with elevated creatinine of unknown etiology last known creatinine as high as 1.5 3 years ago.   Assessment/Plan: 1. Renal- acute versus subacute versus chronic kidney disease.  So far we have an ultrasound which is not that remarkable.  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 Having a positive PR3 makes this a more likely diagnosis.  Creatinine is stable again today.   Patient appears to be nonoliguric and there are no indications for HD.  But, we need to start treatment for GPA with steroids and cytoxan as well as obtain a kidney biopsy for prognostic and diagnostic  purposes. It probably would be best if patient was transferrred to [facility] for this treatment and work up and there is a liklihood that he may need dialysis before this is all said and done.    2.  Anemia- improved with transfusion.  Iron stores were low.  Had heme positive stools so will need work up at some point.  I have on aranesp as well.  LDH is high, haptoglobin also high, platelets seem within normal limilts.  3. HTN/Volume- difficult to assess his volume status.  He has no peripheral edema but has findings on CXR.  His BP is high again.  Steroids will likely make that worse. off of IVF so really is not appearing to be volume overloaded.  No lasix or IVF at this time.  On metoprolol, will add hydralazine.      Bobbye Reinitz A   09/04/2011,12:01 PM  LOS: 3 days

## 2011-09-05 ENCOUNTER — Inpatient Hospital Stay (HOSPITAL_COMMUNITY): Payer: MEDICAID

## 2011-09-05 DIAGNOSIS — R918 Other nonspecific abnormal finding of lung field: Secondary | ICD-10-CM | POA: Diagnosis present

## 2011-09-05 DIAGNOSIS — R0902 Hypoxemia: Secondary | ICD-10-CM

## 2011-09-05 LAB — CBC WITH DIFFERENTIAL/PLATELET
Eosinophils Relative: 0 % (ref 0–5)
HCT: 22.2 % — ABNORMAL LOW (ref 39.0–52.0)
Hemoglobin: 7.7 g/dL — ABNORMAL LOW (ref 13.0–17.0)
Lymphocytes Relative: 5 % — ABNORMAL LOW (ref 12–46)
Lymphs Abs: 0.4 10*3/uL — ABNORMAL LOW (ref 0.7–4.0)
MCV: 83.1 fL (ref 78.0–100.0)
Monocytes Absolute: 0.3 10*3/uL (ref 0.1–1.0)
Monocytes Relative: 4 % (ref 3–12)
RBC: 2.67 MIL/uL — ABNORMAL LOW (ref 4.22–5.81)
WBC: 8.8 10*3/uL (ref 4.0–10.5)

## 2011-09-05 LAB — RENAL FUNCTION PANEL
BUN: 122 mg/dL — ABNORMAL HIGH (ref 6–23)
CO2: 16 mEq/L — ABNORMAL LOW (ref 19–32)
Glucose, Bld: 149 mg/dL — ABNORMAL HIGH (ref 70–99)
Phosphorus: 6.2 mg/dL — ABNORMAL HIGH (ref 2.3–4.6)
Potassium: 4.3 mEq/L (ref 3.5–5.1)

## 2011-09-05 MED ORDER — FENTANYL CITRATE 0.05 MG/ML IJ SOLN
INTRAMUSCULAR | Status: AC
  Filled 2011-09-05: qty 4

## 2011-09-05 MED ORDER — MIDAZOLAM HCL 2 MG/2ML IJ SOLN
INTRAMUSCULAR | Status: AC
  Filled 2011-09-05: qty 4

## 2011-09-05 MED ORDER — FENTANYL CITRATE 0.05 MG/ML IJ SOLN
INTRAMUSCULAR | Status: AC | PRN
  Administered 2011-09-05 (×2): 50 ug via INTRAVENOUS

## 2011-09-05 MED ORDER — DARBEPOETIN ALFA-POLYSORBATE 100 MCG/0.5ML IJ SOLN
100.0000 ug | INTRAMUSCULAR | Status: DC
  Administered 2011-09-05 – 2011-09-12 (×2): 100 ug via SUBCUTANEOUS
  Filled 2011-09-05 (×4): qty 0.5

## 2011-09-05 MED ORDER — MIDAZOLAM HCL 5 MG/5ML IJ SOLN
INTRAMUSCULAR | Status: AC | PRN
  Administered 2011-09-05 (×2): 1 mg via INTRAVENOUS

## 2011-09-05 NOTE — Progress Notes (Signed)
Subjective:   Feeling much better, "walked a mile" in the hospital this morning prior to renal biopsy, no dyspnea, currently ready to eat.  Objective: Vital signs in last 24 hours: Temp:  [97.7 F (36.5 C)-98.3 F (36.8 C)] 98.3 F (36.8 C) (08/09 0900) Pulse Rate:  [58-75] 74  (08/09 1011) Resp:  [15-25] 15  (08/09 1011) BP: (143-179)/(79-106) 143/79 mmHg (08/09 1011) SpO2:  [91 %-99 %] 93 % (08/09 1011) Weight:  [90.6 kg (199 lb 11.8 oz)-91.4 kg (201 lb 8 oz)] 91.4 kg (201 lb 8 oz) (08/08 2130) Weight change: 0.5 kg (1 lb 1.6 oz)  Intake/Output from previous day: 08/08 0701 - 08/09 0700 In: 480 [P.O.:480] Out: 1500 [Urine:1500] Intake/Output this shift: Total I/O In: 0  Out: 250 [Urine:250] EXAM: General appearance:   Alert, comfortable, very talkative Resp:  Coarse at bases; otherwise, clear Cardio:  RRR without murmur GI:  + BS, soft and nontender Extremities:  No edema  Lab Results:  Basename 09/04/11 0400 09/03/11 1105  WBC 7.2 7.8  HGB 7.3* 8.4*  HCT 21.9* 24.6*  PLT 242 235   BMET:  Basename 09/04/11 0400 09/03/11 1105  NA 138 137  K 4.4 4.4  CL 104 102  CO2 18* 18*  GLUCOSE 110* 101*  BUN 100* 98*  CREATININE 10.94* 11.10*  CALCIUM 8.9 9.5  ALBUMIN 2.6* --   No results found for this basename: PTH:2 in the last 72 hours Iron Studies: No results found for this basename: IRON,TIBC,TRANSFERRIN,FERRITIN in the last 72 hours  Assessment/Plan: 1. 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  Other  entitity which this could be is levamisole nephropathy given cocaine abuse , can give positive serologies such as this.  Continue attempted treatment for now.   2. Anemia - Hgb 7.3 yesterday, s/p 4 units on 8/5 for Hgb 4.3, Fe sat 8%; heme + stools, LDH and haptoglobin high, plts normal.  GI workup pending. Bump up to 8 was likely error, hgb stable.  May go down more with biopsy.  Add aranesp 3. HTN/Volume - no peripheral edema, but CXR indicating pulmonary edema; no dyspnea today; BP variable (158/87 most recently), now on Metoprolol 50 mg bid and Hydralazine 25 mg tid.  Consider adding lasix although reasonable UOP on no diuretic    LOS: 4 days   Alexander Grant,Alexander Grant 09/05/2011,11:05 AM  Patient seen and examined, agree with above note with above modifications. Kidney biopsy today, empiric solumedrol and cytoxan.  Supportive care, no HD needed yet.  Labs pending for today.   Annie Sable, MD 09/05/2011

## 2011-09-05 NOTE — Procedures (Signed)
This is a correct to the previous note: Technically successful US guided random biopsy of RIGHT kidney.  No immediate complications.

## 2011-09-05 NOTE — Procedures (Signed)
Technically successful US guided random biopsy of the inferior pole of the left kidney.  No immediate complications.

## 2011-09-05 NOTE — Progress Notes (Signed)
Subjective: Patient feeling better. He relates dyspnea has improved significantly. He was able to walk this morning ' a mile"  without SOB.   Objective: Filed Vitals:   09/05/11 1240 09/05/11 1319 09/05/11 1404 09/05/11 1458  BP: 178/87 157/86 157/91 182/99  Pulse: 68 63 63 69  Temp: 97.9 F (36.6 C) 97.5 F (36.4 C) 97.9 F (36.6 C) 97.8 F (36.6 C)  TempSrc: Oral Oral Oral Oral  Resp: 18 18 20 22   Height:      Weight:      SpO2: 96% 96% 96% 100%   Weight change: 0.5 kg (1 lb 1.6 oz)   General: Alert, awake, oriented x3, in no acute distress.  HEENT: No bruits, no goiter.  Heart: Regular rate and rhythm, without murmurs, rubs, gallops.  Lungs: Crackles bilateral, bilateral air movement.  Abdomen: Soft, nontender, nondistended, positive bowel sounds.  Neuro: Grossly intact, nonfocal. Extremities; no edema.   Lab Results:  Basename 09/05/11 1111 09/04/11 0400  NA 140 138  K 4.3 4.4  CL 105 104  CO2 16* 18*  GLUCOSE 149* 110*  BUN 122* 100*  CREATININE 10.90* 10.94*  CALCIUM 9.4 8.9  MG -- --  PHOS 6.2* 6.3*    Basename 09/05/11 1111 09/04/11 0400  AST -- --  ALT -- --  ALKPHOS -- --  BILITOT -- --  PROT -- --  ALBUMIN 3.0* 2.6*    Basename 09/05/11 1111 09/04/11 0400  WBC 8.8 7.2  NEUTROABS 8.1* --  HGB 7.7* 7.3*  HCT 22.2* 21.9*  MCV 83.1 84.9  PLT 278 242    Micro Results: Recent Results (from the past 240 hour(s))  MRSA PCR SCREENING     Status: Normal   Collection Time   09/01/11  5:43 PM      Component Value Range Status Comment   MRSA by PCR NEGATIVE  NEGATIVE Final     Studies/Results: US Biopsy  09/05/2011  *RADIOLOGY REPORT*  Indication: Acute renal insufficiency of undetermined etiology.  ULTRASOUND GUIDED RENAL BIOPSY  Comparison: Renal ultrasound - 09/01/2011  Medications: Fentanyl 100 mcg IV; Versed 2 mg IV  Total Moderate Sedation time: 15 minutes  Complications: None immediate  Procedure:  Informed written consent was obtained from  the patient after a discussion of the risks, benefits and alternatives to treatment. The patient understands and consents to the procedure.  A timeout was performed prior to the initiation of the procedure.  Ultrasound scanning was performed of the bilateral flanks.  The inferior pole of the right kidney was selected for biopsy due to location and sonographic window.  The procedure was planned.  The operative site was prepped and draped in the usual sterile fashion. The overlying soft tissues were anesthetized with 1% lidocaine with epinephrine.  A 17 gauge core needle biopsy device was advanced into the inferior cortex of the right kidney and 2 core biopsies were obtained under direct ultrasound guidance.  Real time pathologic review confirmed adequate tissue acquisition.  Images were saved for documentation purposes.  The biopsy device was removed and hemostasis was obtained with manual compression.  Post procedural scanning was negative for significant post procedural hemorrhage or additional complication.  A dressing was placed.  The patient tolerated the procedure well without immediate post procedural complication.  Impression:  Technically successful ultrasound guided right renal biopsy.  Original Report Authenticated By: Rachel Moulds, M.D.    Medications: I have reviewed the patient's current medications.  Brief narrative:  Pt is 56  yo male who was admitted 09/01/2011 with generalized weakness and was found to be severely anemic with Hg 4.3 and in acute renal failure with Cr 13, has required 4 units of blood transfusion. 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 Patient was transfer from [facility] for renal biopsy and further care.    1-Severe Anemia:  Iron deficiency vs anemia of chronic diseases.  -This could be multifactorial, secondary to renal failure, occult GI bleed. Needs endoscopy / colonoscopy rule out gastropathy or colon cancer. If not clear evidence of  anemia is found, he will need at some point hematology evaluation, ? Bone marrow Bx. although no leukopenia or thrombocytopenia.  - no active bleeding noted per pt or while in the hospital  - will continue to follow up on daily CBC. -B-12 385, Folate 7.7, Iron 23, ferritin 79 -I will check EPO level.   2- GI bleed   -Guaiac stool positive.  - unclear etiology, GI following, no clear source identified at this point  -Needs Endoscopy/ colonoscopy at some point.   3-Acute renal failure , non oliguric. acute vs subacute vs chronic;  - Anti PR3 + and questionable Wegener's ? Vs granulomatosis with polyangiitis  - no significant change in creatinine since yesterday  - nephrology following and input is certainly appreciated  - started on Solumedrol 8-8  and Cytoxan 8-9 per nephrology team  -S/P Renal Biopsy 8-9.   4-Diffuse bilateral Pulmonary infiltrates of uncertain etiology - pt maintaining oxygen saturations > 95% on RA  -Patient had renal biopsy to rule out Wegener.  -Cytoxan and High dose steroids started -Serial Chest x ray.  Pulmonary following.   Cocaine use  - cessation discussed with pt  - will continue to address with pt   Consultants:  PCCM  Nephrology  GI  Labs:  ANA 8/6 --> positive  Double stranded anti-DNA AB 8/6 --> <1  antistrepolysin 8/6 --> <25  C3 8/6 --> 105  C4 8/6 --> 17  GBM ab 8/6 --> < 13  HIV 8/6 --> non-reactive  ANCA 8/6 -- 1;6 elevated Urine drug 8/6 --> cocaine positive  Haptoglobin 8/6 --> 297  Mpo/pr-3 (anca) Myeloperoxidase Abs 68 (H) <20 AU/mL Serine Protease 3 748 (H)     LOS: 4 days   Riane Rung M.D.  [facility] 09/05/2011, 3:48 PM

## 2011-09-05 NOTE — Progress Notes (Signed)
Name: Alexander Grant MRN: [id number] DOB: Jan 03, 1956    LOS: 4  Reason for consult : Diffuse pulmonary infiltrates.   Consulting MD : Candiss Norse / [facility]  History of Present Illness:  56 y.o. male, who is essentially healthy. Denied any active medical problems, does use NSAIDs on an infrequent basis.Admitted on 8/5 after while at work  his colleagues told him that he looked pale, he also complained of some exertional shortness of breath ongoing for the last few weeks, as well as generalized weakness. In the ER patient was found to be Hemoccult positive, hgb of 4, and  creatinine of 13. He had a CT of chest obtained to further evaluate dyspnea, this demonstrated diffuse bilateral pulmonary infiltrates. Because of this abnormality pulmonary was asked to evaluate.   Lines / Drains:  Cultures: Urine strep 8/6>>>negative Hep B surface ant 8/6: negative Hep B core 8/6: neg Hep C AB: neg  Antibiotics:   Tests / Events: CT chest 8/6 Interstitial and ground-glass opacities throughout the lungs with relative sparing of the bases. This is nonspecific and could represent pulmonary edema, hemorrhage, infection or ARDS, inflammatory process   Echo 8/5>>>There was moderate concentric hypertrophy. Systolic function was normal. The estimated ejection fraction was in the range of 60% to 65%. Wall motion was normal; there were no regional wall motion abnormalities. Doppler parameters are consistent with abnormal left ventricular relaxation (grade 1 diastolic dysfunction)  ANA 8/6>>>positive  Double stranded anti-DNA AB 8/6>>> antistrepolysin 8/6: <25 C3 8/6: 105 C4 8/6: 17 Total complement 8/6>>> GBM ab 8/6>>> HIV 8/6>>>non-reactive ANCA 8/6>>> Urine drug 8/6>>>cocaine LDH 8/6: 261 Haptoglobin 8/6: 297 Mpo/pr-3 (anca) Myeloperoxidase Abs 68 (H) <20 AU/mL Serine Protease 3 748 (H)  8/9 Renal Biopsy>>>  Subjective Feeling a little better, less headache, productive cough  Vital Signs: BP 158/87   Pulse 68  Temp 97.8 F (36.6 C) (Oral)  Resp 16  Ht 6\' 2"  (1.88 m)  Wt 201 lb 8 oz (91.4 kg)  BMI 25.87 kg/m2  SpO2 92% FIO2 2lpm   Intake/Output Summary (Last 24 hours) at 09/05/11 1118 Last data filed at 09/05/11 0901  Gross per 24 hour  Intake    240 ml  Output   1050 ml  Net   -810 ml    Physical Examination: General:  Awake, and alert. No distress Neuro:  No focal def HEENT:  [place], no JVD Cardiovascular:  rrr Lungs:  Crackles in bases Abdomen:  Soft, Non-tender Musculoskeletal:  intact Skin:  intact   Labs and Imaging:   Lab 09/04/11 0400 09/03/11 1105 09/02/11 0655  NA 138 137 140  K 4.4 4.4 4.5  CL 104 102 105  CO2 18* 18* 19  BUN 100* 98* 103*  CREATININE 10.94* 11.10* 12.04*  GLUCOSE 110* 101* 103*    Lab 09/04/11 0400 09/03/11 1105 09/02/11 0655  HGB 7.3* 8.4* 7.5*  HCT 21.9* 24.6* 22.2*  WBC 7.2 7.8 6.8  PLT 242 235 194    Lab 09/01/11 1243  ALT 6  AST 11  GGT --  ALKPHOS 61  BILITOT 0.8   Erythrocyte Sedimentation Rate     Component Value Date/Time   ESRSEDRATE 100* 09/03/2011 1105      CT chest 8/6 diffuse pulmonary infiltrates.  PCXR: bilateral pulmonary infiltrates. No sig change  Assessment and Plan: Diffuse bilateral Pulmonary infiltrates of uncertain etiology.  His ANA and anti-PR3 are both elevated raising concern for GN and pulmonary renal syndrome such as Wegener's. Cytoxan and High dose  steroids started. Possibility of Cocaine induced pneumonitis seeming less likely now.  Recommendation -follow results of renal biopsy  -agree w/ cytoxan and steroids.  -serial cxr  Anemia  Lab 09/04/11 0400 09/03/11 1105 09/02/11 0655  HGB 7.3* 8.4* 7.5*   Suspect that this is due to slow GIB + hemoccult stool. His hgb has improved from 4.3 to 7.5 after transfusion. Smear comment by pathologist: normocytic anemia 8/6.  plan -further work up per GI medicine  -f/u cbc  Renal failure/AKI. 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  Could be related to cocaine use. Renal is following. Scr improving. No indication for HD at this point.     Recent Labs  Basename 09/04/11 0400 09/03/11 1105   CREATININE 10.94* 11.10*  plan -per renal; consider renal bx -cont cytoxan and steroids.  -await ANCA results -f/u cmp  Will follow up on Monday 8/12.  Please call sooner if needs / questions arise.    Best practices / Disposition: -->floor status under [facility] -->full code -->scd for DVT Px -->Protonix for GI Px    Noe Gens, NP-C [facility] Pgr: [phone] or [phone]  Patient seen and examined, agree with above note.  I dictated the care and orders written for this patient under my direction.  Jennet Maduro, M.D. [phone]

## 2011-09-06 LAB — RENAL FUNCTION PANEL
CO2: 16 mEq/L — ABNORMAL LOW (ref 19–32)
Calcium: 9.2 mg/dL (ref 8.4–10.5)
Chloride: 103 mEq/L (ref 96–112)
Creatinine, Ser: 10.72 mg/dL — ABNORMAL HIGH (ref 0.50–1.35)
Glucose, Bld: 145 mg/dL — ABNORMAL HIGH (ref 70–99)

## 2011-09-06 LAB — CBC WITH DIFFERENTIAL/PLATELET
Basophils Relative: 0 % (ref 0–1)
Eosinophils Absolute: 0 10*3/uL (ref 0.0–0.7)
HCT: 19.8 % — ABNORMAL LOW (ref 39.0–52.0)
Hemoglobin: 7 g/dL — ABNORMAL LOW (ref 13.0–17.0)
Lymphs Abs: 0.6 10*3/uL — ABNORMAL LOW (ref 0.7–4.0)
MCH: 29.7 pg (ref 26.0–34.0)
MCHC: 35.4 g/dL (ref 30.0–36.0)
Monocytes Absolute: 0.2 10*3/uL (ref 0.1–1.0)
Monocytes Relative: 2 % — ABNORMAL LOW (ref 3–12)
Neutro Abs: 11.7 10*3/uL — ABNORMAL HIGH (ref 1.7–7.7)
RBC: 2.36 MIL/uL — ABNORMAL LOW (ref 4.22–5.81)

## 2011-09-06 MED ORDER — METOPROLOL TARTRATE 25 MG PO TABS
25.0000 mg | ORAL_TABLET | Freq: Two times a day (BID) | ORAL | Status: DC
  Administered 2011-09-06 – 2011-09-13 (×15): 25 mg via ORAL
  Filled 2011-09-06 (×16): qty 1

## 2011-09-06 MED ORDER — SODIUM BICARBONATE 650 MG PO TABS
650.0000 mg | ORAL_TABLET | Freq: Two times a day (BID) | ORAL | Status: DC
  Administered 2011-09-06 – 2011-09-13 (×15): 650 mg via ORAL
  Filled 2011-09-06 (×16): qty 1

## 2011-09-06 MED ORDER — FUROSEMIDE 40 MG PO TABS
40.0000 mg | ORAL_TABLET | Freq: Two times a day (BID) | ORAL | Status: DC
  Administered 2011-09-06 – 2011-09-13 (×13): 40 mg via ORAL
  Filled 2011-09-06 (×18): qty 1

## 2011-09-06 MED ORDER — PREDNISONE 50 MG PO TABS
80.0000 mg | ORAL_TABLET | Freq: Every day | ORAL | Status: DC
  Administered 2011-09-07 – 2011-09-13 (×7): 80 mg via ORAL
  Filled 2011-09-06 (×9): qty 1

## 2011-09-06 NOTE — Progress Notes (Signed)
  Subjective:   No complaints, feeling much better, no further dyspnea.  Objective: Vital signs in last 24 hours: Temp:  [97.5 F (36.4 C)-98.2 F (36.8 C)] 97.6 F (36.4 C) (08/10 0907) Pulse Rate:  [61-75] 69  (08/10 0907) Resp:  [15-25] 20  (08/10 0907) BP: (143-182)/(79-99) 150/91 mmHg (08/10 0907) SpO2:  [91 %-100 %] 98 % (08/10 0907) Weight:  [91.4 kg (201 lb 8 oz)] 91.4 kg (201 lb 8 oz) (08/09 2051) Weight change: 0.8 kg (1 lb 12.2 oz)  Intake/Output from previous day: 08/09 0701 - 08/10 0700 In: 840 [P.O.:840] Out: 1170 [Urine:1170] Intake/Output this shift: Total I/O In: 240 [P.O.:240] Out: -  EXAM: General appearance:  Alert, in no apparent distress Resp:  Mild coarseness at bases; otherwise, clear Cardio:  RRR without murmur GI:   + BS, soft and nontender Extremities:  No edema  Lab Results:  [facility] 09/06/11 0621 09/05/11 1111  WBC 12.6* 8.8  HGB 7.0* 7.7*  HCT 19.8* 22.2*  PLT 280 278   BMET:  Basename 09/06/11 0621 09/05/11 1111  NA 137 140  K 4.8 4.3  CL 103 105  CO2 16* 16*  GLUCOSE 145* 149*  BUN 130* 122*  CREATININE 10.72* 10.90*  CALCIUM 9.2 9.4  ALBUMIN 2.7* 3.0*   No results found for this basename: PTH:2 in the last 72 hours Iron Studies: No results found for this basename: IRON,TIBC,TRANSFERRIN,FERRITIN in the last 72 hours  Assessment/Plan: 1. 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 Levamisole nephropathy); began empiric treatment with Solumedrol and Cytoxan, right renal biopsy yesterday; BUN/Cr increasing to130/10.7 today, but non-oliguric ( 24-hr urine output 1170 ml yesterday) and no uremic signs or symptoms. Will wean steroids to oral prednisone starting tomorrow 2. Anemia - Hgb down to 7 today, s/p 4 units on 8/5 for Hgb 4.3, Fe sat  8%; heme + stools, LDH and haptoglobin high, plts normal; began Aranesp 100 mcg on Fri.  GI workup pending.   3. HTN/Volume - no peripheral edema, but CXR indicating pulmonary edema; dyspnea resolved; BP variable (150/91 most recently), now on Metoprolol 25 mg bid and Hydralazine 25 mg tid. Metoprolol decreased due to bradycardia.  Will add lasix   LOS: 5 days   LYLES,CHARLES 09/06/2011,9:30 AM  Patient seen and examined, agree with above note with above modifications. No real change today, PO steroids will start tomorrow. Await biopsy, work on BP control.  Annie Sable, MD 09/06/2011

## 2011-09-06 NOTE — Progress Notes (Signed)
Subjective: Feeling well, no SOB. He want to walk 2 miles today. He wants to go outside with his daughter.   Objective: Filed Vitals:   09/05/11 1458 09/05/11 1746 09/05/11 2051 09/06/11 0535  BP: 182/99 158/90 158/85 155/87  Pulse: 69 63 66 61  Temp: 97.8 F (36.6 C) 98.2 F (36.8 C) 97.8 F (36.6 C) 97.5 F (36.4 C)  TempSrc: Oral Oral Oral Oral  Resp: 22 20 20 20   Height:   6\' 2"  (1.88 m)   Weight:   91.4 kg (201 lb 8 oz)   SpO2: 100% 98% 95% 95%   Weight change: 0.8 kg (1 lb 12.2 oz)   General: Alert, awake, oriented x3, in no acute distress.  HEENT: No bruits, no goiter.  Heart: Regular rate and rhythm, without murmurs, rubs, gallops.  Lungs: Crackles bases, bilateral air movement.  Abdomen: Soft, nontender, nondistended, positive bowel sounds.  Extremities; no edema.    Lab Results:  [facility] 09/06/11 0621 09/05/11 1111  NA 137 140  K 4.8 4.3  CL 103 105  CO2 16* 16*  GLUCOSE 145* 149*  BUN 130* 122*  CREATININE 10.72* 10.90*  CALCIUM 9.2 9.4  MG -- --  PHOS 7.0* 6.2*    Basename 09/06/11 0621 09/05/11 1111  AST -- --  ALT -- --  ALKPHOS -- --  BILITOT -- --  PROT -- --  ALBUMIN 2.7* 3.0*    Basename 09/06/11 0621 09/05/11 1111  WBC 12.6* 8.8  NEUTROABS 11.7* 8.1*  HGB 7.0* 7.7*  HCT 19.8* 22.2*  MCV 83.9 83.1  PLT 280 278    Micro Results: Recent Results (from the past 240 hour(s))  MRSA PCR SCREENING     Status: Normal   Collection Time   09/01/11  5:43 PM      Component Value Range Status Comment   MRSA by PCR NEGATIVE  NEGATIVE Final     Studies/Results: US Biopsy  09/05/2011  *RADIOLOGY REPORT*  Indication: Acute renal insufficiency of undetermined etiology.  ULTRASOUND GUIDED RENAL BIOPSY  Comparison: Renal ultrasound - 09/01/2011  Medications: Fentanyl 100 mcg IV; Versed 2 mg IV  Total Moderate Sedation time: 15 minutes  Complications: None immediate  Procedure:  Informed written consent was obtained from the patient after a  discussion of the risks, benefits and alternatives to treatment. The patient understands and consents to the procedure.  A timeout was performed prior to the initiation of the procedure.  Ultrasound scanning was performed of the bilateral flanks.  The inferior pole of the right kidney was selected for biopsy due to location and sonographic window.  The procedure was planned.  The operative site was prepped and draped in the usual sterile fashion. The overlying soft tissues were anesthetized with 1% lidocaine with epinephrine.  A 17 gauge core needle biopsy device was advanced into the inferior cortex of the right kidney and 2 core biopsies were obtained under direct ultrasound guidance.  Real time pathologic review confirmed adequate tissue acquisition.  Images were saved for documentation purposes.  The biopsy device was removed and hemostasis was obtained with manual compression.  Post procedural scanning was negative for significant post procedural hemorrhage or additional complication.  A dressing was placed.  The patient tolerated the procedure well without immediate post procedural complication.  Impression:  Technically successful ultrasound guided right renal biopsy.  Original Report Authenticated By: Rachel Moulds, M.D.    Medications: I have reviewed the patient's current medications.  Brief narrative:  Pt  is 56 yo male who was admitted 09/01/2011 with generalized weakness and was found to be severely anemic with Hg 4.3 and in acute renal failure with Cr 13, has required 4 units of blood transfusion. 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 Patient was transfer from [facility] to [facility] for renal biopsy and further care.    1-Severe Anemia:  Iron deficiency vs anemia of chronic diseases.  -This could be multifactorial, secondary to renal failure, occult GI bleed. Needs endoscopy / colonoscopy rule out gastropathy or colon cancer at some point. If not clear evidence of anemia  is found, he will need at some point hematology evaluation, ? Bone marrow Bx. although no leukopenia or thrombocytopenia.  - no active bleeding noted per pt or while in the hospital  - will continue to follow up on daily CBC.  -B-12 385, Folate 7.7, Iron 23, ferritin 79  -EPO level pending. -Aranesp.   2- GI bleed occult?  -Guaiac stool positive.  - unclear etiology, GI following, no clear source identified at this point  -Needs Endoscopy/ colonoscopy at some point.   3-Acute renal failure , non oliguric. acute vs subacute vs chronic;  - Anti PR3 + and questionable Wegener's ? 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  - nephrology following  - started on Solumedrol (8-8 )and Cytoxan (8-9) per nephrology team  -S/P Renal Biopsy 8-9. Result pending.   4-Diffuse bilateral Pulmonary infiltrates of uncertain etiology  - pt maintaining oxygen saturations > 95% on RA  -Patient had renal biopsy to rule out Wegener.  -Cytoxan and High dose steroids started  -Serial Chest x ray.  Pulmonary following.   5-Bradycardia: 1 episode overnight at 39. holder parameter for metoprolol. I will decrease dose of metoprolol.  6-Hypertension: Hydralazine.   Cocaine use  - cessation discussed with pt  - will continue to address with pt   Consultants:  PCCM  Nephrology  GI Labs:  ANA 8/6 --> positive  Double stranded anti-DNA AB 8/6 --> <1  antistrepolysin 8/6 --> <25  C3 8/6 --> 105  C4 8/6 --> 17  GBM ab 8/6 --> < 13  HIV 8/6 --> non-reactive  ANCA 8/6 -- 1;6 elevated  Urine drug 8/6 --> cocaine positive  Haptoglobin 8/6 --> 297  Mpo/pr-3 (anca) Myeloperoxidase Abs 68 (H) <20 AU/mL Serine Protease 3 748 (H)        LOS: 5 days   Rheannon Cerney M.D.  [facility] 09/06/2011, 8:35 AM

## 2011-09-07 LAB — BASIC METABOLIC PANEL
CO2: 15 mEq/L — ABNORMAL LOW (ref 19–32)
Chloride: 100 mEq/L (ref 96–112)
Creatinine, Ser: 10.01 mg/dL — ABNORMAL HIGH (ref 0.50–1.35)
GFR calc Af Amer: 6 mL/min — ABNORMAL LOW (ref >90)
Potassium: 4.7 mEq/L (ref 3.5–5.1)
Sodium: 133 mEq/L — ABNORMAL LOW (ref 135–145)

## 2011-09-07 LAB — CBC
MCV: 83.6 fL (ref 78.0–100.0)
Platelets: 278 10*3/uL (ref 150–400)
RBC: 2.44 MIL/uL — ABNORMAL LOW (ref 4.22–5.81)
RDW: 14.7 % (ref 11.5–15.5)
WBC: 11.2 10*3/uL — ABNORMAL HIGH (ref 4.0–10.5)

## 2011-09-07 NOTE — Progress Notes (Signed)
  Subjective:  Still no complaints, feeling much better, no further dyspnea.  BP is down.   Objective: Vital signs in last 24 hours: Temp:  [97.3 F (36.3 C)-97.8 F (36.6 C)] 97.3 F (36.3 C) (08/11 0531) Pulse Rate:  [61-69] 61  (08/11 0531) Resp:  [17-20] 20  (08/11 0531) BP: (133-150)/(78-91) 135/78 mmHg (08/11 0531) SpO2:  [95 %-100 %] 95 % (08/11 0531) Weight:  [90.81 kg (200 lb 3.2 oz)] 90.81 kg (200 lb 3.2 oz) (08/10 2159) Weight change: -0.59 kg (-1 lb 4.8 oz)  Intake/Output from previous day: 08/10 0701 - 08/11 0700 In: 240 [P.O.:240] Out: 1300 [Urine:1300] Intake/Output this shift:   EXAM: General appearance:  Alert, in no apparent distress Resp:  Mild coarseness at bases; otherwise, clear Cardio:  RRR without murmur GI:   + BS, soft and nontender Extremities:  No edema  Lab Results:  Basename 09/07/11 0530 09/06/11 0621  WBC 11.2* 12.6*  HGB 7.0* 7.0*  HCT 20.4* 19.8*  PLT 278 280   BMET:   Basename 09/07/11 0530 09/06/11 0621 09/05/11 1111  NA 133* 137 --  K 4.7 4.8 --  CL 100 103 --  CO2 15* 16* --  GLUCOSE 134* 145* --  BUN 142* 130* --  CREATININE 10.01* 10.72* --  CALCIUM 8.9 9.2 --  ALBUMIN -- 2.7* 3.0*   No results found for this basename: PTH:2 in the last 72 hours Iron Studies: No results found for this basename: IRON,TIBC,TRANSFERRIN,FERRITIN in the last 72 hours  Assessment/Plan: 1. 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 Levamisole nephropathy); began empiric treatment with Solumedrol and Cytoxan, right renal biopsy Friday; creatinine down from 10.7 to 10 , but BUN up likely due to steroids.  non-oliguric ( 24-hr urine output 1300 ml yesterday) and no uremic signs or symptoms. Will wean steroids to oral prednisone starting today 2. Anemia -  Hgb stable at  7 today, s/p 4 units on 8/5 for Hgb 4.3, Fe sat 8%; heme + stools, LDH and haptoglobin high, plts normal; began Aranesp 100 mcg on Fri.  GI workup pending.   3. HTN/Volume - no peripheral edema, but CXR indicating pulmonary findings (related to vascultitis) ; dyspnea resolved; BP improved on Metoprolol 25 mg bid and Hydralazine 25 mg tid and lasix. Metoprolol decreased due to bradycardia. Will order CXR for tomorrow to see if improvement.   Need at least preliminary biopsy results to find out what we are dealing with before discharge from hosp will be possible   LOS: 6 days   Alexander Grant 09/07/2011,8:57 AM

## 2011-09-07 NOTE — Progress Notes (Signed)
Subjective: Feeling well, no SOB. No complaint, wondering when he is going to be able to go home.   Objective: Filed Vitals:   09/06/11 2159 09/07/11 0531 09/07/11 0950 09/07/11 1315  BP: 150/90 135/78 149/84 143/79  Pulse: 65 61 60 63  Temp: 97.5 F (36.4 C) 97.3 F (36.3 C) 97.9 F (36.6 C) 98 F (36.7 C)  TempSrc: Oral Oral Oral Oral  Resp: 17 20 18 18   Height:      Weight: 90.81 kg (200 lb 3.2 oz)     SpO2: 100% 95% 99% 98%   Weight change: -0.59 kg (-1 lb 4.8 oz)   General: Alert, awake, oriented x3, in no acute distress.  HEENT: No bruits, no goiter.  Heart: Regular rate and rhythm, without murmurs, rubs, gallops.  Lungs: CTA, bilateral air movement.  Abdomen: Soft, nontender, nondistended, positive bowel sounds.  Neuro: Grossly intact, nonfocal. Extremities; no edema.   Lab Results:  Basename 09/07/11 0530 09/06/11 0621 09/05/11 1111  NA 133* 137 --  K 4.7 4.8 --  CL 100 103 --  CO2 15* 16* --  GLUCOSE 134* 145* --  BUN 142* 130* --  CREATININE 10.01* 10.72* --  CALCIUM 8.9 9.2 --  MG -- -- --  PHOS -- 7.0* 6.2*    Basename 09/06/11 0621 09/05/11 1111  AST -- --  ALT -- --  ALKPHOS -- --  BILITOT -- --  PROT -- --  ALBUMIN 2.7* 3.0*    Basename 09/07/11 0530 09/06/11 0621 09/05/11 1111  WBC 11.2* 12.6* --  NEUTROABS -- 11.7* 8.1*  HGB 7.0* 7.0* --  HCT 20.4* 19.8* --  MCV 83.6 83.9 --  PLT 278 280 --    Micro Results: Recent Results (from the past 240 hour(s))  MRSA PCR SCREENING     Status: Normal   Collection Time   09/01/11  5:43 PM      Component Value Range Status Comment   MRSA by PCR NEGATIVE  NEGATIVE Final     Studies/Results: No results found.  Medications: I have reviewed the patient's current medications.  1-Severe Anemia:  Iron deficiency vs anemia of chronic diseases.  -This could be multifactorial, secondary to renal failure, occult GI bleed. Needs endoscopy / colonoscopy rule out gastropathy or colon cancer at some  point. If not clear evidence of anemia is found, he will need at some point hematology evaluation, ? Bone marrow Bx. although no leukopenia or thrombocytopenia.  - no active bleeding noted per pt or while in the hospital  - will continue to follow up on daily CBC.  -B-12 385, Folate 7.7, Iron 23, ferritin 79  -EPO level pending.  -Aranesp.   2- GI bleed occult?  -Guaiac stool positive.  - unclear etiology, GI following, no clear source identified at this point  -Needs Endoscopy/ colonoscopy at some point.   3-Acute renal failure , non oliguric. acute vs subacute vs chronic;  - Anti PR3 + and questionable Wegener's ? 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 BUN increase probably related to steroids.  - nephrology following  - started on Solumedrol (8-8 )and Cytoxan (8-9) per nephrology team  -S/P Renal Biopsy 8-9. Result pending.   4-Diffuse bilateral Pulmonary infiltrates of uncertain etiology  - pt maintaining oxygen saturations > 95% on RA  -Patient had renal biopsy to rule out Wegener.  -Cytoxan and High dose steroids started  -Serial Chest x ray.  Pulmonary following.   5-Bradycardia: 1 episode overnight at 39 8-10. holder  parameter for metoprolol. I will decrease dose of metoprolol.   6-Hypertension: Hydralazine.   Cocaine use  - cessation discussed with pt  - will continue to address with pt   Consultants:  PCCM  Nephrology  GI  Labs:  ANA 8/6 --> positive  Double stranded anti-DNA AB 8/6 --> <1  antistrepolysin 8/6 --> <25  C3 8/6 --> 105  C4 8/6 --> 17  GBM ab 8/6 --> < 13  HIV 8/6 --> non-reactive  ANCA 8/6 -- 1;6 elevated  Urine drug 8/6 --> cocaine positive  Haptoglobin 8/6 --> 297  Mpo/pr-3 (anca) Myeloperoxidase Abs 68 (H) <20 AU/mL Serine Protease 3 748 (H)          LOS: 6 days   Taura Lamarre M.D.  [facility] 09/07/2011, 3:27 PM

## 2011-09-08 ENCOUNTER — Inpatient Hospital Stay (HOSPITAL_COMMUNITY): Payer: MEDICAID

## 2011-09-08 LAB — CBC WITH DIFFERENTIAL/PLATELET
Basophils Absolute: 0 10*3/uL (ref 0.0–0.1)
Basophils Relative: 0 % (ref 0–1)
Hemoglobin: 7.6 g/dL — ABNORMAL LOW (ref 13.0–17.0)
Lymphocytes Relative: 9 % — ABNORMAL LOW (ref 12–46)
MCHC: 35.2 g/dL (ref 30.0–36.0)
Monocytes Relative: 6 % (ref 3–12)
Neutro Abs: 10.3 10*3/uL — ABNORMAL HIGH (ref 1.7–7.7)
Neutrophils Relative %: 85 % — ABNORMAL HIGH (ref 43–77)
RDW: 14.7 % (ref 11.5–15.5)
WBC: 12.1 10*3/uL — ABNORMAL HIGH (ref 4.0–10.5)

## 2011-09-08 LAB — RENAL FUNCTION PANEL
CO2: 17 mEq/L — ABNORMAL LOW (ref 19–32)
Calcium: 8.6 mg/dL (ref 8.4–10.5)
GFR calc Af Amer: 6 mL/min — ABNORMAL LOW (ref >90)
GFR calc non Af Amer: 6 mL/min — ABNORMAL LOW (ref >90)
Potassium: 4.2 mEq/L (ref 3.5–5.1)
Sodium: 136 mEq/L (ref 135–145)

## 2011-09-08 LAB — ERYTHROPOIETIN: Erythropoietin: 53.6 m[IU]/mL — ABNORMAL HIGH (ref 2.6–34.0)

## 2011-09-08 MED ORDER — PEG 3350-KCL-NA BICARB-NACL 420 G PO SOLR
2000.0000 mL | Freq: Once | ORAL | Status: AC
  Administered 2011-09-09: 2000 mL via ORAL
  Filled 2011-09-08: qty 4000

## 2011-09-08 MED ORDER — PANTOPRAZOLE SODIUM 40 MG PO TBEC
40.0000 mg | DELAYED_RELEASE_TABLET | Freq: Every day | ORAL | Status: DC
  Administered 2011-09-09 – 2011-09-10 (×2): 40 mg via ORAL
  Filled 2011-09-08 (×2): qty 1

## 2011-09-08 MED ORDER — BISACODYL 5 MG PO TBEC
5.0000 mg | DELAYED_RELEASE_TABLET | Freq: Two times a day (BID) | ORAL | Status: AC
  Administered 2011-09-09 – 2011-09-10 (×3): 5 mg via ORAL
  Filled 2011-09-08 (×3): qty 1

## 2011-09-08 MED ORDER — FERUMOXYTOL INJECTION 510 MG/17 ML
510.0000 mg | INTRAVENOUS | Status: DC
  Administered 2011-09-08: 510 mg via INTRAVENOUS
  Filled 2011-09-08 (×2): qty 17

## 2011-09-08 NOTE — Progress Notes (Signed)
Name: Alexander Grant MRN: [id number] DOB: 10/28/1955    LOS: 7  Reason for consult : Diffuse pulmonary infiltrates.   Consulting MD : Candiss Norse / [facility]  History of Present Illness:  56 y.o. male, who is essentially healthy. Denied any active medical problems, does use NSAIDs on an infrequent basis.Admitted on 8/5 after while at work  his colleagues told him that he looked pale, he also complained of some exertional shortness of breath ongoing for the last few weeks, as well as generalized weakness. In the ER patient was found to be Hemoccult positive, hgb of 4, and  creatinine of 13. He had a CT of chest obtained to further evaluate dyspnea, this demonstrated diffuse bilateral pulmonary infiltrates. Because of this abnormality pulmonary was asked to evaluate.   Lines / Drains:  Cultures: Urine strep 8/6>>>negative Hep B surface ant 8/6: negative Hep B core 8/6: neg Hep C AB: neg  Antibiotics:   Tests / Events: CT chest 8/6 Interstitial and ground-glass opacities throughout the lungs with relative sparing of the bases. This is nonspecific and could represent pulmonary edema, hemorrhage, infection or ARDS, inflammatory process   Echo 8/5>>>There was moderate concentric hypertrophy. Systolic function was normal. The estimated ejection fraction was in the range of 60% to 65%. Wall motion was normal; there were no regional wall motion abnormalities. Doppler parameters are consistent with abnormal left ventricular relaxation (grade 1 diastolic dysfunction)  ANA 8/6>>>positive  Double stranded anti-DNA AB 8/6>>> antistrepolysin 8/6: <25 C3 8/6: 105 C4 8/6: 17 Total complement 8/6>>> GBM ab 8/6>>> HIV 8/6>>>non-reactive ANCA 8/6>>> Urine drug 8/6>>>cocaine LDH 8/6: 261 Haptoglobin 8/6: 297 Mpo/pr-3 (anca) Myeloperoxidase Abs 68 (H) <20 AU/mL Serine Protease 3 748 (H)  8/9 Renal Biopsy>>>  Subjective Feeling a little better, less headache, productive cough  Vital Signs: BP 133/79   Pulse 64  Temp 97.5 F (36.4 C) (Oral)  Resp 18  Ht 6\' 2"  (1.88 m)  Wt 200 lb (90.719 kg)  BMI 25.68 kg/m2  SpO2 97% FIO2 2lpm   Intake/Output Summary (Last 24 hours) at 09/08/11 0925 Last data filed at 09/08/11 0902  Gross per 24 hour  Intake   1340 ml  Output    775 ml  Net    565 ml    Physical Examination: General:  Awake, and alert. No distress Neuro:  No focal def HEENT:  [place], no JVD Cardiovascular:  rrr Lungs:  CTA , no O2. Walking without distress Abdomen:  Soft, Non-tender Musculoskeletal:  intact Skin:  intact   Labs and Imaging:   Lab 09/08/11 0625 09/07/11 0530 09/06/11 0621  NA 136 133* 137  K 4.2 4.7 4.8  CL 102 100 103  CO2 17* 15* 16*  BUN 149* 142* 130*  CREATININE 9.23* 10.01* 10.72*  GLUCOSE 129* 134* 145*    Lab 09/08/11 0625 09/07/11 0530 09/06/11 0621  HGB 7.6* 7.0* 7.0*  HCT 21.6* 20.4* 19.8*  WBC 12.1* 11.2* 12.6*  PLT 312 278 280    Lab 09/01/11 1243  ALT 6  AST 11  GGT --  ALKPHOS 61  BILITOT 0.8   Erythrocyte Sedimentation Rate     Component Value Date/Time   ESRSEDRATE 100* 09/03/2011 1105    CT chest 8/6 diffuse pulmonary infiltrates.  PCXR: bilateral pulmonary infiltrates. No sig change No results found.  Assessment and Plan: Diffuse bilateral Pulmonary infiltrates of uncertain etiology.  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 Cytoxan and  High dose steroids started. Possibility of Cocaine induced pneumonitis seeming less likely now.  Recommendation -follow results of renal biopsy  -agree w/ cytoxan and steroids.  -serial cxr  Anemia  Lab 09/08/11 0625 09/07/11 0530 09/06/11 0621  HGB 7.6* 7.0* 7.0*   Suspect that this is due to slow GIB + hemoccult stool. His hgb has improved from 4.3 to 7.5 after transfusion. Smear comment by pathologist: normocytic anemia 8/6.  plan -further work up per GI medicine  -f/u cbc  Renal failure/AKI. 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 Could be related to cocaine use. Renal is following. Scr improving. No indication for HD at this point.     Recent Labs  Basename 09/08/11 0625 09/07/11 0530 09/06/11 0621   CREATININE 9.23* 10.01* 10.72*  plan -per renal;  renal bx results pending -cont cytoxan and steroids.  -f/u cmp   Best practices / Disposition: -->floor status under [facility] -->full code -->scd for DVT Px -->Protonix for GI Px    [name] ACNP [facility] PCCM Pager [phone] till 3 pm If no answer page [phone] 09/08/2011, 9:25 AM   Attending Addendum:  I have seen the patient, discussed the issues, test results and plans with S. Minor, NP. I agree with the Assessment and Plans as ammended above.   [name], MD, PhD 09/08/2011, 11:01 AM [facility] [phone] or if no answer [phone]

## 2011-09-08 NOTE — Progress Notes (Signed)
Subjective:   Patient does not have any complaints, No nausea or vomiting, feeling good. No SOB. Reports having walked about 2 miles.  UOP: 500 ml  Pending renal function (cre was 10.01 and  BUN was 142on 09/07/11).   Objective: Vital signs in last 24 hours: Filed Vitals:   09/07/11 1315 09/07/11 1824 09/07/11 2054 09/08/11 0526  BP: 143/79 137/87 138/66 133/79  Pulse: 63 58 62 64  Temp: 98 F (36.7 C) 96.5 F (35.8 C) 98.2 F (36.8 C) 97.5 F (36.4 C)  TempSrc: Oral Oral Oral Oral  Resp: 18 18 20 18   Height:      Weight:   200 lb (90.719 kg)   SpO2: 98% 100% 99% 97%   Weight change: -3.2 oz (-0.091 kg)  Intake/Output Summary (Last 24 hours) at 09/08/11 0703 Last data filed at 09/08/11 0526  Gross per 24 hour  Intake    840 ml  Output    500 ml  Net    340 ml    Vitals: T: 98.2   HR: 64   BP: 133/79   RR:18     O2 saturation: 97 5   General: resting in bed, not in acute distress HEENT: PERRL, EOMI, no scleral icterus Cardiac: S1/S2, RRR, No murmurs, gallops or rubs Pulm: Mild coarseness at base, No rales, wheezing, rhonchi or rubs. Abd: Soft,  nondistended, nontender, no rebound pain, no organomegaly, BS present Ext: No rashes or edema, 2+DP/PT pulse bilaterally Musculoskeletal: No joint deformities, erythema, or stiffness, ROM full and no nontender Skin: no rashes. No skin bruise. Neuro: alert and oriented X3, cranial nerves II-XII grossly intact, muscle strength 5/5 in all extremeties,  sensation to light touch intact.   Lab Results: Basic Metabolic Panel:  Lab 09/07/11 1610 09/06/11 0621 09/05/11 1111  NA 133* 137 --  K 4.7 4.8 --  CL 100 103 --  CO2 15* 16* --  GLUCOSE 134* 145* --  BUN 142* 130* --  CREATININE 10.01* 10.72* --  CALCIUM 8.9 9.2 --  MG -- -- --  PHOS -- 7.0* 6.2*   Liver Function Tests:  Lab 09/06/11 0621 09/05/11 1111 09/01/11 1243  AST -- -- 11  ALT -- -- 6  ALKPHOS -- -- 61  BILITOT -- -- 0.8  PROT -- -- 7.0  ALBUMIN 2.7*  3.0* --   No results found for this basename: LIPASE:2,AMYLASE:2 in the last 168 hours No results found for this basename: AMMONIA:2 in the last 168 hours CBC:  Lab 09/07/11 0530 09/06/11 0621 09/05/11 1111  WBC 11.2* 12.6* --  NEUTROABS -- 11.7* 8.1*  HGB 7.0* 7.0* --  HCT 20.4* 19.8* --  MCV 83.6 83.9 --  PLT 278 280 --   Cardiac Enzymes:  Lab 09/01/11 1422  CKTOTAL 96  CKMB --  CKMBINDEX --  TROPONINI --   BNP:  Lab 09/01/11 1422  PROBNP 13030.0*   D-Dimer: No results found for this basename: DDIMER:2 in the last 168 hours CBG:  Lab 09/04/11 1811  GLUCAP 126*   Hemoglobin A1C:  Lab 09/01/11 1243  HGBA1C 4.7   Fasting Lipid Panel: No results found for this basename: CHOL,HDL,LDLCALC,TRIG,CHOLHDL,LDLDIRECT in the last 960 hours Thyroid Function Tests:  Lab 09/01/11 1422  TSH 1.930  T4TOTAL --  FREET4 --  T3FREE --  THYROIDAB --   Coagulation:  Lab 09/01/11 1243  LABPROT 15.7*  INR 1.22   Anemia Panel:  Lab 09/01/11 1243  VITAMINB12 385  FOLATE 7.7  FERRITIN 79  TIBC 304  IRON 23*  RETICCTPCT 5.7*   Urine Drug Screen: Drugs of Abuse     Component Value Date/Time   LABOPIA NONE DETECTED 09/02/2011 1555   COCAINSCRNUR POSITIVE* 09/02/2011 1555   LABBENZ NONE DETECTED 09/02/2011 1555   AMPHETMU NONE DETECTED 09/02/2011 1555   THCU NONE DETECTED 09/02/2011 1555   LABBARB NONE DETECTED 09/02/2011 1555    Alcohol Level: No results found for this basename: ETH:2 in the last 168 hours Urinalysis:  Lab 09/01/11 1504  COLORURINE YELLOW  LABSPEC 1.015  PHURINE 6.0  GLUCOSEU NEGATIVE  HGBUR LARGE*  BILIRUBINUR NEGATIVE  KETONESUR NEGATIVE  PROTEINUR 100*  UROBILINOGEN 0.2  NITRITE NEGATIVE  LEUKOCYTESUR NEGATIVE   Micro Results: Recent Results (from the past 240 hour(s))  MRSA PCR SCREENING     Status: Normal   Collection Time   09/01/11  5:43 PM      Component Value Range Status Comment   MRSA by PCR NEGATIVE  NEGATIVE Final     Studies/Results: No results found. Medications:  Scheduled Meds:   . cyclophosphamide  150 mg Oral QAC breakfast  . darbepoetin (ARANESP) injection - NON-DIALYSIS  100 mcg Subcutaneous Q Fri-1800  . furosemide  40 mg Oral BID  . hydrALAZINE  25 mg Oral Q8H  . metoprolol tartrate  25 mg Oral BID  . pantoprazole (PROTONIX) IV  40 mg Intravenous Q12H  . predniSONE  80 mg Oral Q breakfast  . sodium bicarbonate  650 mg Oral BID  . sodium chloride  3 mL Intravenous Q12H   Continuous Infusions:  PRN Meds:.albuterol, guaiFENesin-dextromethorphan, LORazepam, ondansetron (ZOFRAN) IV, ondansetron Assessment/Plan:  # Renal failure - acute vs subacute vs chronic; negative [place]; UA showed granular casts, microscopic hematuria, and mild proteinuria;+ PR3 along with hematuria and lung findings suggestive of acute GN (GPA) although drug screen + for cocaine (? Levamisole nephropathy); began empiric treatment with Solumedrol and Cytoxan,  Pending  right renal biopsy (on 09/05/11) creatinine down from 10.7 to 10.01 , but BUN up likely due to steroids.  UOP 500 ml No uremic signs or symptoms.   -Continue oral prednisone 80 mg daily and Cytoxan 150 mg daily.  -Pending renal function panel.    # Metabolic acidosis:  -Continue oral bicarbonate  -Pending renal function panel.  # Anemia - Hgb stable at 7.0 on 09/07/11, s/p 4 units on 8/5 for Hgb 4.3, Iron 23, Ferritin 79,  Fe sat 8%; heme + stools, LDH and haptoglobin high, plts normal;   -Began Aranesp 100 mcg on 09/05/11.   -GI workup pending.  - add iron IV  # HTN/Volume - no peripheral edema, but CXR indicating pulmonary findings (moderate interstitial edema; likely related to vascultitis) ; dyspnea resolved;  Bp is normal today.  - Will get CXR for today to see if improvement.      LOS: 7 days   [name] 09/08/2011, 7:03 AM [name] had renal bx last week.  Cr 9.2, but BUN  still > 100.  He says he has not used cocaine in > 3 yr, but  urine drug screen is + for cocaine.  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  Renal bx will be helpful to see if any viable glomeruli are left.  Will check CXR today as well.  If glomeruli are all sclerotic (non-reversible) then he may need to remain on cytoxan/prednisone for pulmonary disease.  Will check CXR today

## 2011-09-08 NOTE — Progress Notes (Signed)
     [facility] Daily Rounding Note 09-16-11, 3:49 PM  SUBJECTIVE:       Dr Lauraine Rinne asks that GI proceed with work up of Anemia and heme positive stool in this 56 y/o man. Initial GI consult for same was on 09/01/11.  No work up initially due to acute pulm issues.  CXR today shows clearing of pulm edema and effusions. He is walking in hall without effort. Fatigue has resolved.  Renal failure persists, but has not required dialysis.  Hgb is 7.6 but had reached apex of 8.4 on 09/03/11 after 4 units red cells.  Received parenteral Iron.  Has not had repeat FOB testing since 09/01/11  OBJECTIVE:         Vital signs in last 24 hours:    Temp:  [96.5 F (35.8 C)-98.2 F (36.8 C)] 97.7 F (36.5 C) 09/16/22 1310) Pulse Rate:  [55-64] 55  09-16-2022 1310) Resp:  [17-20] 17  September 16, 2022 1310) BP: (133-147)/(66-90) 145/82 mmHg 16-Sep-2022 1310) SpO2:  [97 %-100 %] 98 % 09-16-22 1310) Weight:  [200 lb (90.719 kg)] 200 lb (90.719 kg) (08/11 2054) Last BM Date: 09-16-2011 General: Looks well, talkative, NAD   Heart: RRR.  No MRG Chest: Clear.  No dyspnea or cough Abdomen: soft, ND, NT, no mass or HSM  Extremities: no pedal edema Neuro/Psych:  Anxious, speech pressured.  Not disoriented or confused.   Intake/Output from previous day: 08/11 0701 - September 16, 2022 0700 In: 1580 [P.O.:1560; IV Piggyback:20] Out: 500 [Urine:500]  Intake/Output this shift: Total I/O In: 600 [P.O.:600] Out: 275 [Urine:275]  Lab Results:  [facility] 2011/09/16 0625 09/07/11 0530 09/06/11 0621  WBC 12.1* 11.2* 12.6*  HGB 7.6* 7.0* 7.0*  HCT 21.6* 20.4* 19.8*  PLT 312 278 280   BMET  Basename 16-Sep-2011 0625 09/07/11 0530 09/06/11 0621  NA 136 133* 137  K 4.2 4.7 4.8  CL 102 100 103  CO2 17* 15* 16*  GLUCOSE 129* 134* 145*  BUN 149* 142* 130*  CREATININE 9.23* 10.01* 10.72*  CALCIUM 8.6 8.9 9.2   LFT  Basename 2011-09-16 0625 09/06/11 0621  PROT -- --  ALBUMIN 2.6* 2.7*  AST -- --  ALT -- --  ALKPHOS -- --  BILITOT -- --    BILIDIR -- --  IBILI -- --   Studies/Results: Dg Chest 2 View 16-Sep-2011  *RADIOLOGY REPORT*  Clinical Data: Follow up of multi focal airspace opacities  CHEST - 2 VIEW  Comparison: Chest x-ray of 09/03/2011  Findings: The probable edema pattern has largely cleared, and effusions have resolved.  There are still somewhat prominent interstitial markings present particularly in the upper lobes. Mediastinal contours are stable.  The heart is enlarged and stable. No bony abnormality is seen.  IMPRESSION: Resolution of edema pattern with effusions.  Probable chronic interstitial change remains.  Recommend continued follow-up.  Original Report Authenticated By: Juline Patch, M.D.    ASSESMENT: * FOB+ anemia.  S/p 4 units PRBCs. Renal initiated Aranessp on 09/05/11. Got parenteral Iron infusion today.  *  Pulmonary infiltrates, acute renal failure. ANA, anti-PR3 are elevated. ? Wegener's.  Cytoxan and high dose steroids initiated.  Renal bx on 09/05/11, results pending.  *  Glucose intolerance in setting high dose steroids.     PLAN: *  Colonoscopy and EGD on Wed 8/14. Start clears tomorrow am.    LOS: 7 days   Alexander Grant  Sep 16, 2011, 3:49 PM Pager: [phone]

## 2011-09-08 NOTE — Progress Notes (Signed)
Subjective: Patient feeling well, waiting to hear result of biopsy.  Breathing at baseline.  Walking on the hall, no SOB.  Objective: Filed Vitals:   09/07/11 2054 09/08/11 0526 09/08/11 0926 09/08/11 1310  BP: 138/66 133/79 147/90 145/82  Pulse: 62 64 61 55  Temp: 98.2 F (36.8 C) 97.5 F (36.4 C) 97.6 F (36.4 C) 97.7 F (36.5 C)  TempSrc: Oral Oral Oral Oral  Resp: 20 18 18 17   Height:      Weight: 90.719 kg (200 lb)     SpO2: 99% 97% 99% 98%   Weight change: -0.091 kg (-3.2 oz)   General: Alert, awake, oriented x3, in no acute distress.  HEENT: No bruits, no goiter.  Heart: Regular rate and rhythm, without murmurs, rubs, gallops.  Lungs: Crackles bases, bilateral air movement.  Abdomen: Soft, nontender, nondistended, positive bowel sounds.  Neuro: Grossly intact, nonfocal.\ Extremities; no edema.    Lab Results:  Basename 09/08/11 0625 09/07/11 0530 09/06/11 0621  NA 136 133* --  K 4.2 4.7 --  CL 102 100 --  CO2 17* 15* --  GLUCOSE 129* 134* --  BUN 149* 142* --  CREATININE 9.23* 10.01* --  CALCIUM 8.6 8.9 --  MG -- -- --  PHOS 7.5* -- 7.0*    Basename 09/08/11 0625 09/06/11 0621  AST -- --  ALT -- --  ALKPHOS -- --  BILITOT -- --  PROT -- --  ALBUMIN 2.6* 2.7*   Basename 09/08/11 0625 09/07/11 0530 09/06/11 0621  WBC 12.1* 11.2* --  NEUTROABS 10.3* -- 11.7*  HGB 7.6* 7.0* --  HCT 21.6* 20.4* --  MCV 83.4 83.6 --  PLT 312 278 --    Micro Results: Recent Results (from the past 240 hour(s))  MRSA PCR SCREENING     Status: Normal   Collection Time   09/01/11  5:43 PM      Component Value Range Status Comment   MRSA by PCR NEGATIVE  NEGATIVE Final     Studies/Results: Dg Chest 2 View  09/08/2011  *RADIOLOGY REPORT*  Clinical Data: Follow up of multi focal airspace opacities  CHEST - 2 VIEW  Comparison: Chest x-ray of 09/03/2011  Findings: The probable edema pattern has largely cleared, and effusions have resolved.  There are still somewhat  prominent interstitial markings present particularly in the upper lobes. Mediastinal contours are stable.  The heart is enlarged and stable. No bony abnormality is seen.  IMPRESSION: Resolution of edema pattern with effusions.  Probable chronic interstitial change remains.  Recommend continued follow-up.  Original Report Authenticated By: Joretta Bachelor, M.D.    Medications: I have reviewed the patient's current medications.  1-Severe Anemia:  Iron deficiency vs Anemia of chronic diseases.  -This could be multifactorial, secondary to renal failure, occult GI bleed. Needs endoscopy / colonoscopy rule out gastropathy or colon cancer at some point. If not clear evidence of anemia is found, he will need at some point hematology evaluation, ? Bone marrow Bx. although no leukopenia or thrombocytopenia.  - no active bleeding noted per pt or while in the hospital  - will continue to follow up on daily CBC.  -B-12 385, Folate 7.7, Iron 23, ferritin 79  -EPO level pending.  -Aranesp.  -Hb stable.   2- GI bleed occult?  -Guaiac stool positive.  - unclear etiology, GI following, no clear source identified at this point  -Needs Endoscopy/ colonoscopy at some point. Will call GI again to see if they are planing  to do work up inpatient.   3-Acute renal failure , non oliguric. acute vs subacute vs chronic;  - Anti PR3 + and questionable Wegener's ? 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 BUN increase probably related to steroids.  - nephrology following  - started on Solumedrol (8-8 ) No w on prednisone and Cytoxan (8-9) per nephrology team  -S/P Renal Biopsy 8-9. Result pending.   4-Diffuse bilateral Pulmonary infiltrates of uncertain etiology  - pt maintaining oxygen saturations > 95% on RA  -Patient had renal biopsy to rule out Wegener.  -Cytoxan and High dose steroids started  - Chest x ray 8-12 : Resolution of edema pattern with effusions. Probable chronic  interstitial change remains.  Recommend continued follow-up. Pulmonary following, GI might want to know if patient is clear for procedure from pulmonary point of view.   5-Bradycardia: 1 episode overnight at 39 on 8-10. holder parameter for metoprolol. I will decrease dose of metoprolol.   6-Hypertension: Hydralazine.   Cocaine use  - cessation discussed with pt  - will continue to address with pt   Consultants:  PCCM  Nephrology  GI   Labs:  ANA 8/6 --> positive  Double stranded anti-DNA AB 8/6 --> <1  antistrepolysin 8/6 --> <25  C3 8/6 --> 105  C4 8/6 --> 17  GBM ab 8/6 --> < 13  HIV 8/6 --> non-reactive  ANCA 8/6 -- 1;6 elevated  Urine drug 8/6 --> cocaine positive  Haptoglobin 8/6 --> 297  Mpo/pr-3 (anca) Myeloperoxidase Abs 68 (H) <20 AU/mL Serine Protease 3 748 (H)   Disposition: Waiting for Biopsy result and to see if GI is planing inpatient work up.         LOS: 7 days   Alexander Grant M.D.  [facility] 09/08/2011, 3:31 PM

## 2011-09-09 DIAGNOSIS — R918 Other nonspecific abnormal finding of lung field: Secondary | ICD-10-CM

## 2011-09-09 DIAGNOSIS — R195 Other fecal abnormalities: Secondary | ICD-10-CM | POA: Diagnosis present

## 2011-09-09 LAB — CBC
Platelets: 332 10*3/uL (ref 150–400)
RBC: 2.73 MIL/uL — ABNORMAL LOW (ref 4.22–5.81)
WBC: 11.7 10*3/uL — ABNORMAL HIGH (ref 4.0–10.5)

## 2011-09-09 LAB — RENAL FUNCTION PANEL
Albumin: 2.8 g/dL — ABNORMAL LOW (ref 3.5–5.2)
Chloride: 101 mEq/L (ref 96–112)
GFR calc non Af Amer: 6 mL/min — ABNORMAL LOW (ref >90)
Potassium: 4.1 mEq/L (ref 3.5–5.1)

## 2011-09-09 MED ORDER — AMLODIPINE BESYLATE 5 MG PO TABS
5.0000 mg | ORAL_TABLET | Freq: Every day | ORAL | Status: DC
  Administered 2011-09-09 – 2011-09-13 (×5): 5 mg via ORAL
  Filled 2011-09-09 (×5): qty 1

## 2011-09-09 MED ORDER — SODIUM CHLORIDE 0.9 % IV SOLN
INTRAVENOUS | Status: DC
  Administered 2011-09-10: 20 mL/h via INTRAVENOUS

## 2011-09-09 NOTE — Progress Notes (Signed)
Subjective:   Patient does not have any complaints, No nausea or vomiting, feeling good, no SOB. Reports having walked about 1 mile on the hall.   Waiting to hear result of kidney biopsy.   UOP: increased from 500 on 8/12 to 1175 today (8/13).  Creatinine continues to decrease, it is down from 13.32 on admission to 8.74 today.   Objective: Vital signs in last 24 hours: Filed Vitals:   09/08/11 1310 09/08/11 1811 09/08/11 2037 09/09/11 0543  BP: 145/82 141/82 156/96 159/92  Pulse: 55 60 62 63  Temp: 97.7 F (36.5 C) 97.5 F (36.4 C) 97.4 F (36.3 C) 98.4 F (36.9 C)  TempSrc: Oral Oral Oral Oral  Resp: 17 18 19 18   Height:      Weight:   199 lb 11.2 oz (90.583 kg)   SpO2: 98% 100% 99% 97%   Weight change: -4.8 oz (-0.136 kg)  Intake/Output Summary (Last 24 hours) at 09/09/11 0722 Last data filed at 09/09/11 0548  Gross per 24 hour  Intake    840 ml  Output   1175 ml  Net   -335 ml   UOP: 1175 ml.  Vitals: T: 98.4   HR: 63   BP: 159/92   RR:18     O2 saturation: 97%  General: resting in bed, not in acute distress HEENT: PERRL, EOMI, no scleral icterus Cardiac: S1/S2, RRR, No murmurs, gallops or rubs Pulm: Mild coarseness at base, No rales, wheezing, rhonchi or rubs. Abd: Soft,  nondistended, nontender, no rebound pain, no organomegaly, BS present Ext: No rashes or edema, 2+DP/PT pulse bilaterally Musculoskeletal: No joint deformities, erythema, or stiffness, ROM full and no nontender Skin: no rashes. No skin bruise. Neuro: alert and oriented X3, cranial nerves II-XII grossly intact, muscle strength 5/5 in all extremeties,  sensation to light touch intact.   Lab Results: Basic Metabolic Panel:  Lab 09/09/11 6578 09/08/11 0625  NA 137 136  K 4.1 4.2  CL 101 102  CO2 19 17*  GLUCOSE 108* 129*  BUN 154* 149*  CREATININE 8.74* 9.23*  CALCIUM 8.9 8.6  MG -- --  PHOS 7.9* 7.5*   Liver Function Tests:  Lab 09/09/11 0545 09/08/11 0625  AST -- --  ALT -- --    ALKPHOS -- --  BILITOT -- --  PROT -- --  ALBUMIN 2.8* 2.6*   CBC:  Lab 09/09/11 0545 09/08/11 0625 09/06/11 0621  WBC 11.7* 12.1* --  NEUTROABS -- 10.3* 11.7*  HGB 7.9* 7.6* --  HCT 22.6* 21.6* --  MCV 82.8 83.4 --  PLT 332 312 --   CBG:  Lab 09/04/11 1811  GLUCAP 126*    Drugs of Abuse     Component Value Date/Time   LABOPIA NONE DETECTED 09/02/2011 1555   COCAINSCRNUR POSITIVE* 09/02/2011 1555   LABBENZ NONE DETECTED 09/02/2011 1555   AMPHETMU NONE DETECTED 09/02/2011 1555   THCU NONE DETECTED 09/02/2011 1555   LABBARB NONE DETECTED 09/02/2011 1555    Alcohol Level: No results found for this basename: ETH:2 in the last 168 hours Urinalysis: No results found for this basename: COLORURINE:2,APPERANCEUR:2,LABSPEC:2,PHURINE:2,GLUCOSEU:2,HGBUR:2,BILIRUBINUR:2,KETONESUR:2,PROTEINUR:2,UROBILINOGEN:2,NITRITE:2,LEUKOCYTESUR:2 in the last 168 hours Micro Results: Recent Results (from the past 240 hour(s))  MRSA PCR SCREENING     Status: Normal   Collection Time   09/01/11  5:43 PM      Component Value Range Status Comment   MRSA by PCR NEGATIVE  NEGATIVE Final    Studies/Results: Dg Chest 2 View  09/08/2011  *  RADIOLOGY REPORT*  Clinical Data: Follow up of multi focal airspace opacities  CHEST - 2 VIEW  Comparison: Chest x-ray of 09/03/2011  Findings: The probable edema pattern has largely cleared, and effusions have resolved.  There are still somewhat prominent interstitial markings present particularly in the upper lobes. Mediastinal contours are stable.  The heart is enlarged and stable. No bony abnormality is seen.  IMPRESSION: Resolution of edema pattern with effusions.  Probable chronic interstitial change remains.  Recommend continued follow-up.  Original Report Authenticated By: Juline Patch, M.D.   Medications:  Scheduled Meds:    . bisacodyl  5 mg Oral BID  . cyclophosphamide  150 mg Oral QAC breakfast  . darbepoetin (ARANESP) injection - NON-DIALYSIS  100 mcg Subcutaneous  Q Fri-1800  . ferumoxytol  510 mg Intravenous Q3 days  . furosemide  40 mg Oral BID  . hydrALAZINE  25 mg Oral Q8H  . metoprolol tartrate  25 mg Oral BID  . pantoprazole  40 mg Oral Q0600  . polyethylene glycol-electrolytes  2,000 mL Oral Once  . predniSONE  80 mg Oral Q breakfast  . sodium bicarbonate  650 mg Oral BID  . sodium chloride  3 mL Intravenous Q12H  . 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 Levamisole nephropathy); began empiric treatment with Solumedrol and Cytoxan. Chest x ray on 8/12 showed resolution of edema pattern with effusions and probable chronic interstitial change remains.   Ceatinine trends down from 13.32 on admission to 8.74 today , but BUN is up likely due to steroids. UOP  Is improving. No uremic signs or symptoms.    - Will continue oral prednisone 80 mg daily and Cytoxan 150 mg daily.  - Pending right renal biopsy (on 09/05/11).  # Metabolic acidosis: bicarbonate is 19 today (8/13)  -Will continue oral bicarbonate for one more day. If normal tomorrow, will d/c bicarbonate.  # Anemia - s/p 4 units on 8/5 for Hgb 4.3, Iron 23, Ferritin 79,  Fe sat 8%; heme + stools, LDH and haptoglobin high, plts normal; Hgb is 7.9 today (8/13).   -Began Aranesp 100 mcg on 09/05/11 and IV iron on 8/12.  -GI workup( Colonoscopy and EGD) on Wed 8/14.   # HTN:  On lasix, hydralazine and metoprolol, Per primary team.   [name], MD PGY2, Internal Medicine Teaching Service Pager: [phone] Mr. Ashley Royalty feels well.  BP is still high.  Will add amlodipine.  Renal bx arrived  yesterday in [place].  Prelim results ? later today.  Continue cytoxan and prednisone.  GI to see re: heme + stool.  Receiving aranesp and IV Fe.

## 2011-09-09 NOTE — Progress Notes (Signed)
Subjective: Patient feeling well, no SOB. No new complain.  Objective: Filed Vitals:   09/09/11 0543 09/09/11 0810 09/09/11 0900 09/09/11 1400  BP: 159/92  158/88 152/79  Pulse: 63  77 82  Temp: 98.4 F (36.9 C) 98 F (36.7 C) 97.9 F (36.6 C) 97.6 F (36.4 C)  TempSrc: Oral Oral Oral Oral  Resp: 18  18 18   Height:      Weight:      SpO2: 97%  98% 96%   Weight change: -0.136 kg (-4.8 oz)   General: Alert, awake, oriented x3, in no acute distress.  HEENT: No bruits, no goiter.  Heart: Regular rate and rhythm, without murmurs, rubs, gallops.  Lungs: Crackles bases, fine, bilateral air movement.  Abdomen: Soft, nontender, nondistended, positive bowel sounds.  Neuro: Grossly intact, nonfocal. Extremities; no edema.    Lab Results:  [facility] 09/09/11 0545 09/08/11 0625  NA 137 136  K 4.1 4.2  CL 101 102  CO2 19 17*  GLUCOSE 108* 129*  BUN 154* 149*  CREATININE 8.74* 9.23*  CALCIUM 8.9 8.6  MG -- --  PHOS 7.9* 7.5*    Basename 09/09/11 0545 09/08/11 0625  AST -- --  ALT -- --  ALKPHOS -- --  BILITOT -- --  PROT -- --  ALBUMIN 2.8* 2.6*    Basename 09/09/11 0545 09/08/11 0625  WBC 11.7* 12.1*  NEUTROABS -- 10.3*  HGB 7.9* 7.6*  HCT 22.6* 21.6*  MCV 82.8 83.4  PLT 332 312   Micro Results: Recent Results (from the past 240 hour(s))  MRSA PCR SCREENING     Status: Normal   Collection Time   09/01/11  5:43 PM      Component Value Range Status Comment   MRSA by PCR NEGATIVE  NEGATIVE Final     Studies/Results: Dg Chest 2 View  09/08/2011  *RADIOLOGY REPORT*  Clinical Data: Follow up of multi focal airspace opacities  CHEST - 2 VIEW  Comparison: Chest x-ray of 09/03/2011  Findings: The probable edema pattern has largely cleared, and effusions have resolved.  There are still somewhat prominent interstitial markings present particularly in the upper lobes. Mediastinal contours are stable.  The heart is enlarged and stable. No bony abnormality is seen.   IMPRESSION: Resolution of edema pattern with effusions.  Probable chronic interstitial change remains.  Recommend continued follow-up.  Original Report Authenticated By: Joretta Bachelor, M.D.    Medications: I have reviewed the patient's current medications.  Brief narrative:  Pt is 56 yo male who was admitted 09/01/2011 with generalized weakness and was found to be severely anemic with Hg 4.3 and in acute renal failure with Cr 13, has required 4 units of blood transfusion. 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 Patient was transfer from [place] [facility] to [facility] for renal biopsy and further care. Patient renal function stable, no need for dialysis at this time. Renal biopsy pending. Patient hb has remain stable GI is planing endoscopy, colonoscopy 8-14.    1-Severe Anemia:  Iron deficiency vs Anemia of chronic diseases.  -This could be multifactorial, secondary to renal failure, occult GI bleed. Needs endoscopy / colonoscopy rule out gastropathy or colon cancer at some point. If not clear evidence of anemia is found, he will need at some point hematology evaluation, ? Bone marrow Bx. although no leukopenia or thrombocytopenia.  - no active bleeding noted per pt or while in the hospital  - will continue to follow up on  daily CBC.  -B-12 385, Folate 7.7, Iron 23, ferritin 79  -EPO level at 53.  -Aranesp.  -Hb stable.   2- GI bleed occult?  -Guaiac stool positive.  - unclear etiology, GI following, no clear source identified at this point  -Endoscopy, Colonoscopy planned for 8-14.   3-Acute renal failure , non oliguric. acute vs subacute vs chronic;  - Anti PR3 + and questionable Wegener's ? Vs granulomatosis with polyangiitis, cocaine (? Levamisole nephropathy) -Cr trending down. - nephrology following  - started on Solumedrol (8-8 ) No w on prednisone and Cytoxan (8-9) per nephrology team  -S/P Renal Biopsy 8-9. Result pending.   4-Diffuse bilateral Pulmonary infiltrates  of uncertain etiology  - pt maintaining oxygen saturations > 95% on RA  -Patient had renal biopsy to rule out Wegener.  -Cytoxan and High dose steroids started  - Chest x ray 8-12 : Resolution of edema pattern with effusions. Probable chronic  interstitial change remains. Recommend continued follow-up.  Pulmonary Sign off.   5-Bradycardia: 1 episode overnight at 39 on 8-10. holder parameter for metoprolol.  Metoprolol dose decrease.   6-Hypertension: Hydralazine.   Cocaine use  - cessation discussed with pt  - will continue to address with pt   Consultants:  PCCM : Sign off.  Nephrology  GI    Labs:  ANA 8/6 --> positive  Double stranded anti-DNA AB 8/6 --> <1  antistrepolysin 8/6 --> <25  C3 8/6 --> 105  C4 8/6 --> 17  GBM ab 8/6 --> < 13  HIV 8/6 --> non-reactive  ANCA 8/6 -- 1;6 elevated  Urine drug 8/6 --> cocaine positive  Haptoglobin 8/6 --> 297  Mpo/pr-3 (anca) Myeloperoxidase Abs 68 (H) <20 AU/mL Serine Protease 3 748 (H)   Disposition: Waiting for Biopsy result and Endoscopy/Colonoscopy 8-14.         LOS: 8 days   REGALADO,BELKYS M.D.  [facility] 09/09/2011, 4:35 PM

## 2011-09-09 NOTE — Progress Notes (Signed)
Name: Alexander Grant MRN: [id number] DOB: 03-22-1955    LOS: 8  Reason for consult : Diffuse pulmonary infiltrates.   Consulting MD : Candiss Norse / [facility]  History of Present Illness:  56 y.o. male, who is essentially healthy. Denied any active medical problems, does use NSAIDs on an infrequent basis.Admitted on 8/5 after while at work  his colleagues told him that he looked pale, he also complained of some exertional shortness of breath ongoing for the last few weeks, as well as generalized weakness. In the ER patient was found to be Hemoccult positive, hgb of 4, and  creatinine of 13. He had a CT of chest obtained to further evaluate dyspnea, this demonstrated diffuse bilateral pulmonary infiltrates. Because of this abnormality pulmonary was asked to evaluate.   Lines / Drains:  Cultures: Urine strep 8/6>>>negative Hep B surface ant 8/6: negative Hep B core 8/6: neg Hep C AB: neg  Antibiotics:   Tests / Events: CT chest 8/6 Interstitial and ground-glass opacities throughout the lungs with relative sparing of the bases. This is nonspecific and could represent pulmonary edema, hemorrhage, infection or ARDS, inflammatory process   Echo 8/5>>>There was moderate concentric hypertrophy. Systolic function was normal. The estimated ejection fraction was in the range of 60% to 65%. Wall motion was normal; there were no regional wall motion abnormalities. Doppler parameters are consistent with abnormal left ventricular relaxation (grade 1 diastolic dysfunction)  ANA 8/6>>>positive  Double stranded anti-DNA AB 8/6>>> antistrepolysin 8/6: <25 C3 8/6: 105 C4 8/6: 17 Total complement 8/6>>> GBM ab 8/6>>> HIV 8/6>>>non-reactive ANCA 8/6>>> Urine drug 8/6>>>cocaine LDH 8/6: 261 Haptoglobin 8/6: 297 Mpo/pr-3 (anca) Myeloperoxidase Abs 68 (H) <20 AU/mL Serine Protease 3 748 (H)  8/9 Renal Biopsy>>>  Subjective Feeling a little better, less headache, productive cough  Vital Signs: BP 159/92   Pulse 63  Temp 98 F (36.7 C) (Oral)  Resp 18  Ht 6\' 2"  (1.88 m)  Wt 199 lb 11.2 oz (90.583 kg)  BMI 25.64 kg/m2  SpO2 97% FIO2 2lpm   Intake/Output Summary (Last 24 hours) at 09/09/11 0936 Last data filed at 09/09/11 0548  Gross per 24 hour  Intake    480 ml  Output    900 ml  Net   -420 ml    Physical Examination: General:  Awake, and alert. No distress Neuro:  No focal def HEENT:  [place], no JVD Cardiovascular:  rrr Lungs:  CTA , no O2. Walking without distress Abdomen:  Soft, Non-tender Musculoskeletal:  intact Skin:  intact   Labs and Imaging:   Lab 09/09/11 0545 09/08/11 0625 09/07/11 0530  NA 137 136 133*  K 4.1 4.2 4.7  CL 101 102 100  CO2 19 17* 15*  BUN 154* 149* 142*  CREATININE 8.74* 9.23* 10.01*  GLUCOSE 108* 129* 134*    Lab 09/09/11 0545 09/08/11 0625 09/07/11 0530  HGB 7.9* 7.6* 7.0*  HCT 22.6* 21.6* 20.4*  WBC 11.7* 12.1* 11.2*  PLT 332 312 278   No results found for this basename: ALT:3,AST:3,GGT:3,ALKPHOS:3,BILITOT:3 in the last 168 hours Erythrocyte Sedimentation Rate     Component Value Date/Time   ESRSEDRATE 100* 09/03/2011 1105  ana + Ana titer 1:80 panca +  CT chest 8/6 diffuse pulmonary infiltrates.  PCXR: bilateral pulmonary infiltrates. No sig change Dg Chest 2 View  09/08/2011  *RADIOLOGY REPORT*  Clinical Data: Follow up of multi focal airspace opacities  CHEST - 2 VIEW  Comparison: Chest x-ray of 09/03/2011  Findings: The probable  edema pattern has largely cleared, and effusions have resolved.  There are still somewhat prominent interstitial markings present particularly in the upper lobes. Mediastinal contours are stable.  The heart is enlarged and stable. No bony abnormality is seen.  IMPRESSION: Resolution of edema pattern with effusions.  Probable chronic interstitial change remains.  Recommend continued follow-up.  Original Report Authenticated By: Joretta Bachelor, M.D.    Assessment and Plan: Diffuse bilateral Pulmonary  infiltrates of uncertain etiology.  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 Cytoxan and High dose steroids started. Possibility of Cocaine induced pneumonitis seeming less likely now.  Recommendation -follow results of renal biopsy  -agree w/ cytoxan and steroids.  -serial cxr  Anemia  Lab 09/09/11 0545 09/08/11 0625 09/07/11 0530  HGB 7.9* 7.6* 7.0*   Suspect that this is due to slow GIB + hemoccult stool. His hgb has improved from 4.3 to 7.5 after transfusion. Smear comment by pathologist: normocytic anemia 8/6.  plan -further work up per GI medicine  -f/u cbc  Renal failure/AKI. 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  Renal is following. Scr improving. No indication for HD at this point.     Recent Labs  Basename 09/09/11 0545 09/08/11 0625 09/07/11 0530   CREATININE 8.74* 9.23* 10.01*  plan -per renal;  renal bx results pending -cont cytoxan and steroids.  -f/u cmp   Best practices / Disposition: -->floor status under [facility] -->full code -->scd for DVT Px -->Protonix for GI Px  PCCM will sign off. Please call if we can assist further.   Richardson Landry Minor ACNP Maryanna Shape PCCM Pager [phone] till 3 pm If no answer page [phone] 09/09/2011, 9:36 AM   Attending Addendum:  I have seen the patient, discussed the issues, test results and plans with S. Minor, NP. I agree with the Assessment and Plans as ammended above.   [name], MD, PhD 09/09/2011, 11:52 AM [facility] [phone] or if no answer [phone]

## 2011-09-09 NOTE — Progress Notes (Signed)
Patient seen, examined, and I agree with the above documentation, including the assessment and plan. Overall improvement in renal and pulmonary status. Will plan EGD/colon tomorrow for eval of heme + anemia with iron def.  S/p IV iron.

## 2011-09-10 ENCOUNTER — Encounter (HOSPITAL_COMMUNITY): Payer: Self-pay | Admitting: *Deleted

## 2011-09-10 ENCOUNTER — Encounter (HOSPITAL_COMMUNITY)
Admission: EM | Disposition: A | Discharge status: Home / Self Care | Payer: Self-pay | Source: Home / Self Care | Attending: Internal Medicine

## 2011-09-10 DIAGNOSIS — K2100 Gastro-esophageal reflux disease with esophagitis, without bleeding: Secondary | ICD-10-CM

## 2011-09-10 DIAGNOSIS — R195 Other fecal abnormalities: Secondary | ICD-10-CM

## 2011-09-10 DIAGNOSIS — K2960 Other gastritis without bleeding: Secondary | ICD-10-CM

## 2011-09-10 HISTORY — PX: ESOPHAGOGASTRODUODENOSCOPY: SHX5428

## 2011-09-10 LAB — RENAL FUNCTION PANEL
CO2: 21 mEq/L (ref 19–32)
Chloride: 100 mEq/L (ref 96–112)
GFR calc Af Amer: 8 mL/min — ABNORMAL LOW (ref >90)
GFR calc non Af Amer: 7 mL/min — ABNORMAL LOW (ref >90)
Potassium: 4.3 mEq/L (ref 3.5–5.1)
Sodium: 138 mEq/L (ref 135–145)

## 2011-09-10 SURGERY — EGD (ESOPHAGOGASTRODUODENOSCOPY)
Anesthesia: Moderate Sedation

## 2011-09-10 MED ORDER — BISACODYL 5 MG PO TBEC
5.0000 mg | DELAYED_RELEASE_TABLET | Freq: Two times a day (BID) | ORAL | Status: AC
  Administered 2011-09-10 – 2011-09-11 (×3): 5 mg via ORAL
  Filled 2011-09-10 (×5): qty 1

## 2011-09-10 MED ORDER — MIDAZOLAM HCL 5 MG/ML IJ SOLN
INTRAMUSCULAR | Status: AC
  Filled 2011-09-10: qty 3

## 2011-09-10 MED ORDER — SODIUM CHLORIDE 0.9 % IV SOLN: INTRAVENOUS | Status: DC

## 2011-09-10 MED ORDER — PANTOPRAZOLE SODIUM 40 MG PO TBEC
40.0000 mg | DELAYED_RELEASE_TABLET | Freq: Two times a day (BID) | ORAL | Status: DC
  Administered 2011-09-10 – 2011-09-13 (×5): 40 mg via ORAL
  Filled 2011-09-10 (×5): qty 1

## 2011-09-10 MED ORDER — BUTAMBEN-TETRACAINE-BENZOCAINE 2-2-14 % EX AERO
INHALATION_SPRAY | CUTANEOUS | Status: DC | PRN
  Administered 2011-09-10: 2 via TOPICAL

## 2011-09-10 MED ORDER — MIDAZOLAM HCL 10 MG/2ML IJ SOLN
INTRAMUSCULAR | Status: DC | PRN
  Administered 2011-09-10: 3 mg via INTRAVENOUS
  Administered 2011-09-10: 2 mg via INTRAVENOUS

## 2011-09-10 MED ORDER — FENTANYL CITRATE 0.05 MG/ML IJ SOLN
INTRAMUSCULAR | Status: DC | PRN
  Administered 2011-09-10: 25 ug via INTRAVENOUS
  Administered 2011-09-10: 50 ug via INTRAVENOUS

## 2011-09-10 MED ORDER — CALCIUM ACETATE 667 MG PO CAPS
1334.0000 mg | ORAL_CAPSULE | Freq: Three times a day (TID) | ORAL | Status: DC
  Administered 2011-09-10 – 2011-09-12 (×4): 1334 mg via ORAL
  Filled 2011-09-10 (×8): qty 2

## 2011-09-10 MED ORDER — PEG 3350-KCL-NA BICARB-NACL 420 G PO SOLR
2000.0000 mL | Freq: Once | ORAL | Status: AC
  Administered 2011-09-10: 2000 mL via ORAL
  Filled 2011-09-10 (×2): qty 4000

## 2011-09-10 MED ORDER — FENTANYL CITRATE 0.05 MG/ML IJ SOLN
INTRAMUSCULAR | Status: AC
  Filled 2011-09-10: qty 4

## 2011-09-10 NOTE — Progress Notes (Signed)
[facility] Progress Note  09/10/2011   Subjective: Pt is prepped and ready for EGD/Colon today.  He denies complaints.  He says he feels much better.    Objective:  Vital signs in last 24 hours: Filed Vitals:   09/09/11 1802 09/09/11 2215 09/10/11 0647 09/10/11 0909  BP: 143/90 152/87 143/86 159/84  Pulse: 82 54 62 62  Temp: 98.4 F (36.9 C) 97.3 F (36.3 C) 97.5 F (36.4 C) 97.1 F (36.2 C)  TempSrc: Oral Oral Oral Oral  Resp: 18 19 20 20   Height:  6\' 2"  (1.88 m)    Weight:  88.089 kg (194 lb 3.2 oz)    SpO2: 96% 99% 100% 98%   Weight change: -2.495 kg (-5 lb 8 oz)  Intake/Output Summary (Last 24 hours) at 09/10/11 0935 Last data filed at 09/10/11 0900  Gross per 24 hour  Intake   4480 ml  Output   1300 ml  Net   3180 ml   Lab Results  Component Value Date   HGBA1C 4.7 09/01/2011   Lab Results  Component Value Date   CREATININE 7.72* 09/10/2011    Review of Systems As above, otherwise all reviewed and reported negative  Physical Exam General - awake, no distress, cooperative HEENT - NCAT, MMM Lungs - BBS, CTA CV - normal s1, s2 sounds Abd - soft, nondistended, no masses, nontender Ext - no C/C/E  Lab Results: Results for orders placed during the hospital encounter of 09/01/11 (from the past 24 hour(s))  RENAL FUNCTION PANEL     Status: Abnormal   Collection Time   09/10/11  5:50 AM      Component Value Range   Sodium 138  135 - 145 mEq/L   Potassium 4.3  3.5 - 5.1 mEq/L   Chloride 100  96 - 112 mEq/L   CO2 21  19 - 32 mEq/L   Glucose, Bld 93  70 - 99 mg/dL   BUN 152 (*) 6 - 23 mg/dL   Creatinine, Ser 7.72 (*) 0.50 - 1.35 mg/dL   Calcium 9.5  8.4 - 10.5 mg/dL   Phosphorus 8.6 (*) 2.3 - 4.6 mg/dL   Albumin 3.0 (*) 3.5 - 5.2 g/dL   GFR calc non Af Amer 7 (*) >90 mL/min   GFR calc Af Amer 8 (*) >90 mL/min    Micro Results: Recent Results (from the past 240 hour(s))  MRSA PCR SCREENING     Status: Normal   Collection Time   09/01/11  5:43 PM       Component Value Range Status Comment   MRSA by PCR NEGATIVE  NEGATIVE Final     Medications:  Scheduled Meds:   . amLODipine  5 mg Oral Daily  . bisacodyl  5 mg Oral BID  . cyclophosphamide  150 mg Oral QAC breakfast  . darbepoetin (ARANESP) injection - NON-DIALYSIS  100 mcg Subcutaneous Q Fri-1800  . ferumoxytol  510 mg Intravenous Q3 days  . furosemide  40 mg Oral BID  . hydrALAZINE  25 mg Oral Q8H  . metoprolol tartrate  25 mg Oral BID  . pantoprazole  40 mg Oral Q0600  . polyethylene glycol-electrolytes  2,000 mL Oral Once  . predniSONE  80 mg Oral Q breakfast  . sodium bicarbonate  650 mg Oral BID  . sodium chloride  3 mL Intravenous Q12H   Continuous Infusions:   . sodium chloride 10 mL/hr at 09/09/11 2350   PRN Meds:.albuterol, guaiFENesin-dextromethorphan, LORazepam, ondansetron (ZOFRAN) IV,  ondansetron  Assessment/Plan: Pt is 56 yo male who was admitted 09/01/2011 with generalized weakness and was found to be severely anemic with Hg 4.3 and in acute renal failure with Cr 13, has required 4 units of blood transfusion. 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 Patient was transfer from [place] [facility] to [facility] for renal biopsy and further care. Patient renal function stable, no need for dialysis at this time. Renal biopsy pending. Patient hb has remain stable GI is planing endoscopy, colonoscopy 8-14.   1-Severe Anemia:  Iron deficiency vs Anemia of chronic diseases.  -This could be multifactorial, secondary to renal failure, occult GI bleed. Needs endoscopy / colonoscopy rule out gastropathy or colon cancer at some point. If not clear evidence of anemia is found, he will need at some point hematology evaluation, ? Bone marrow Bx. although no leukopenia or thrombocytopenia.  - no active bleeding noted per pt or while in the hospital  - will continue to follow up on daily CBC.  -B-12 385, Folate 7.7, Iron 23, ferritin 79  -EPO level at 53.  -Aranesp.    -Hb stable.   2- GI bleed occult?  -Guaiac stool positive.  - unclear etiology, GI following, no clear source identified at this point  -Endoscopy, Colonoscopy planned for 8-14.   3-Acute renal failure , non oliguric. acute vs subacute vs chronic;  - Anti PR3 + and questionable Wegener's ? Vs granulomatosis with polyangiitis, cocaine (? Levamisole nephropathy)  -Cr trending down.  - nephrology following  - started on Solumedrol (8-8 ) No w on prednisone and Cytoxan (8-9) per nephrology team  -S/P Renal Biopsy 8-9. Result pending.   4-Diffuse bilateral Pulmonary infiltrates of uncertain etiology  - pt maintaining oxygen saturations > 95% on RA  -Patient had renal biopsy to rule out Wegener.  -Cytoxan and High dose steroids started  - Chest x ray 8-12 : Resolution of edema pattern with effusions. Probable chronic  interstitial change remains. Recommend continued follow-up.  Pulmonary Signed off.   5-Bradycardia: improved holder parameter for metoprolol. Metoprolol dose decreased 25 mg bid.   6-Hypertension: Hydralazine 25 mg po tid.   Cocaine use  - cessation discussed with pt  - will continue to address with pt   Consultants:  PCCM : Sign off.  Nephrology  GI  Labs:  ANA 8/6 --> positive   Double stranded anti-DNA AB 8/6 --> <1  antistrepolysin 8/6 --> <25  C3 8/6 --> 105  C4 8/6 --> 17  GBM ab 8/6 --> < 13  HIV 8/6 --> non-reactive  ANCA 8/6 -- 1;6 elevated   Urine drug 8/6 --> cocaine positive  Haptoglobin 8/6 --> 297  Mpo/pr-3 (anca) Myeloperoxidase Abs 68 (H) <20 AU/mL Serine Protease 3 748 (H)    LOS: 9 days   Clanford Johnson 09/10/2011, 9:35 AM  Murlean Iba, MD, CDE, FAAFP [facility] [place]

## 2011-09-10 NOTE — Progress Notes (Signed)
Subjective:   Patient does not have any complaints, No nausea or vomiting, feeling good, no SOB. Reports having walked on the hall.   Waiting to hear result of kidney biopsy.   UOP: increased 900 ml  Creatinine continues to decrease, it is down from 13.32 on admission to 7.72 today.   Will get EGD/colonoscopy today (8/14)  Objective: Vital signs in last 24 hours: Filed Vitals:   09/09/11 1400 09/09/11 1802 09/09/11 2215 09/10/11 0647  BP: 152/79 143/90 152/87 143/86  Pulse: 82 82 54 62  Temp: 97.6 F (36.4 C) 98.4 F (36.9 C) 97.3 F (36.3 C) 97.5 F (36.4 C)  TempSrc: Oral Oral Oral Oral  Resp: 18 18 19 20   Height:   6\' 2"  (1.88 m)   Weight:   194 lb 3.2 oz (88.089 kg)   SpO2: 96% 96% 99% 100%   Weight change: -5 lb 8 oz (-2.495 kg)  Intake/Output Summary (Last 24 hours) at 09/10/11 0706 Last data filed at 09/09/11 2216  Gross per 24 hour  Intake   4240 ml  Output    900 ml  Net   3340 ml   UOP: 900 ml.  Vitals: T: 98.4   HR: 62   BP:143/86   RR:20     O2 saturation:100%  General: resting in bed, not in acute distress HEENT: PERRL, EOMI, no scleral icterus Cardiac: S1/S2, RRR, No murmurs, gallops or rubs Pulm: Mild coarseness at base, No rales, wheezing, rhonchi or rubs. Abd: Soft,  nondistended, nontender, no rebound pain, no organomegaly, BS present Ext: No rashes or edema, 2+DP/PT pulse bilaterally Musculoskeletal: No joint deformities, erythema, or stiffness, ROM full and no nontender Skin: no rashes. No skin bruise. Neuro: alert and oriented X3, cranial nerves II-XII grossly intact, muscle strength 5/5 in all extremeties,  sensation to light touch intact.   Lab Results: Basic Metabolic Panel:  Lab 09/10/11 4098 09/09/11 0545  NA 138 137  K 4.3 4.1  CL 100 101  CO2 21 19  GLUCOSE 93 108*  BUN PENDING 154*  CREATININE 7.72* 8.74*  CALCIUM 9.5 8.9  MG -- --  PHOS 8.6* 7.9*   Liver Function Tests:  Lab 09/10/11 0550 09/09/11 0545  AST -- --    ALT -- --  ALKPHOS -- --  BILITOT -- --  PROT -- --  ALBUMIN 3.0* 2.8*   CBC:  Lab 09/09/11 0545 09/08/11 0625 09/06/11 0621  WBC 11.7* 12.1* --  NEUTROABS -- 10.3* 11.7*  HGB 7.9* 7.6* --  HCT 22.6* 21.6* --  MCV 82.8 83.4 --  PLT 332 312 --   CBG:  Lab 09/04/11 1811  GLUCAP 126*    Drugs of Abuse     Component Value Date/Time   LABOPIA NONE DETECTED 09/02/2011 1555   COCAINSCRNUR POSITIVE* 09/02/2011 1555   LABBENZ NONE DETECTED 09/02/2011 1555   AMPHETMU NONE DETECTED 09/02/2011 1555   THCU NONE DETECTED 09/02/2011 1555   LABBARB NONE DETECTED 09/02/2011 1555    Micro Results: Recent Results (from the past 240 hour(s))  MRSA PCR SCREENING     Status: Normal   Collection Time   09/01/11  5:43 PM      Component Value Range Status Comment   MRSA by PCR NEGATIVE  NEGATIVE Final    Studies/Results: Dg Chest 2 View  09/08/2011  *RADIOLOGY REPORT*  Clinical Data: Follow up of multi focal airspace opacities  CHEST - 2 VIEW  Comparison: Chest x-ray of 09/03/2011  Findings: The probable  edema pattern has largely cleared, and effusions have resolved.  There are still somewhat prominent interstitial markings present particularly in the upper lobes. Mediastinal contours are stable.  The heart is enlarged and stable. No bony abnormality is seen.  IMPRESSION: Resolution of edema pattern with effusions.  Probable chronic interstitial change remains.  Recommend continued follow-up.  Original Report Authenticated By: Juline Patch, M.D.   Medications:  Scheduled Meds:    . amLODipine  5 mg Oral Daily  . bisacodyl  5 mg Oral BID  . cyclophosphamide  150 mg Oral QAC breakfast  . darbepoetin (ARANESP) injection - NON-DIALYSIS  100 mcg Subcutaneous Q Fri-1800  . ferumoxytol  510 mg Intravenous Q3 days  . furosemide  40 mg Oral BID  . hydrALAZINE  25 mg Oral Q8H  . metoprolol tartrate  25 mg Oral BID  . pantoprazole  40 mg Oral Q0600  . polyethylene glycol-electrolytes  2,000 mL Oral Once   . predniSONE  80 mg Oral Q breakfast  . sodium bicarbonate  650 mg Oral BID  . sodium chloride  3 mL Intravenous Q12H   Continuous Infusions:    . sodium chloride 10 mL/hr at 09/09/11 2350   PRN Meds:.albuterol, guaiFENesin-dextromethorphan, LORazepam, ondansetron (ZOFRAN) IV, ondansetron Assessment/Plan:  # Renal failure - Acute vs subacute vs chronic; negative [place]; UA showed granular casts, microscopic hematuria, and mild proteinuria;+ PR3 along with hematuria and lung findings suggestive of acute GN (GPA) although drug screen + for cocaine (? Levamisole nephropathy); began empiric treatment with Solumedrol and Cytoxan. Chest x ray on 8/12 showed resolution of edema pattern with effusions and probable chronic interstitial change remains.   Ceatinine trends down from 13.32 on admission to 7.72 today , UOP 900 ml. No uremic signs or symptoms.    - Will continue oral prednisone 80 mg daily and Cytoxan 150 mg daily.  - Pending right renal biopsy (on 09/05/11).  # Metabolic acidosis: bicarbonate is 21today (8/14)  -Will d/c oral bicarbonate   # Anemia - s/p 4 units on 8/5 for Hgb 4.3, Iron 23, Ferritin 79,  Fe sat 8%; heme + stools, LDH and haptoglobin high, plts normal; Hgb is 7.9 today (8/13).   -Began Aranesp 100 mcg on 09/05/11 and IV iron on 8/12.  -GI workup (Colonoscopy and EGD) today (8/14).   # HTN:  Was on lasix, hydralazine and metoprolol. Added amlodipine on 8/13 due to elevated bp. Today bp is 143/84. Will continue the same regimen.  [name], MD PGY2, Internal Medicine Teaching Service Pager: [phone]  Mr. Ashley Royalty is asleep following GI procedure.  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 Jeannetta Nap at [facility]).  The is some focal and segmental glomerulosclerosis as well. Will continue with cytoxan and prednisone fct improving.  Will add PhosLo 1 TID.  On aranesp and Fe.

## 2011-09-10 NOTE — Op Note (Signed)
[facility] [address] [place], [place]  [address]  COLONOSCOPY PROCEDURE REPORT  PATIENT:  Alexander Grant, Alexander Grant  MR#:  [id number] BIRTHDATE:  [birth year], 56 yrs. old  GENDER:  male ENDOSCOPIST:  Carie Caddy. Maleko Greulich, MD REF. BY:  Standley Dakins, M.D. PROCEDURE DATE:  09/10/2011 PROCEDURE:  Incomplete colonoscopy ASA CLASS:  Class III INDICATIONS:  Iron Deficiency Anemia, FOBT positive stool MEDICATIONS:   Fentanyl 100 mcg IV, Versed 8 mg IV combined procedure  DESCRIPTION OF PROCEDURE:   After the risks benefits and alternatives of the procedure were thoroughly explained, informed consent was obtained.  Digital rectal exam was performed and revealed Stool palpable on digital rectal examination.   The a, EC-[id number] ([id number]) and [id number] ([id number]) endoscope was introduced through the anus and advanced to the rectum, limited by poor preparation.    The quality of the prep was poor, using MoviPrep.  The instrument was then slowly withdrawn as the colon was fully examined. <<PROCEDUREIMAGES>>  FINDINGS:  The prep was not adequate to allow appropriate inspection of the mucosa. Solid stool was found in the rectum, and therefore the procedure was aborted. Retroflexion was not done. The scope was then withdrawn from the rectum and the procedure completed.  COMPLICATIONS:  None  ENDOSCOPIC IMPRESSION: 1) Poor prep with solid stool in the rectum.  RECOMMENDATIONS: 1) Reprep with repeat colonoscopy tomorrow.  Carie Caddy. Ivelis Norgard, MD  CC:  The Patient  n. eSIGNED:   Carie Caddy. Beck Cofer at 09/10/2011 12:30 PM  [name], [phone]

## 2011-09-10 NOTE — Progress Notes (Signed)
Utilization review completed.  

## 2011-09-10 NOTE — Op Note (Signed)
[facility] [address] [place], [place]  [address]  ENDOSCOPY PROCEDURE REPORT  PATIENT:  Alexander, Grant  MR#:  [id number] BIRTHDATE:  August 31, 1955, 56 yrs. old  GENDER:  male ENDOSCOPIST:  Carie Caddy. Ayushi Pla, MD Referred by:  Standley Dakins, M.D. PROCEDURE DATE:  09/10/2011 PROCEDURE:  EGD with biopsy for H. pylori 19147, EGD with biopsy, 43239 ASA CLASS:  Class III INDICATIONS:  iron deficiency anemia, hemoccult positive stool MEDICATIONS:   Fentanyl 100 mcg IV, Versed 8 mg IV TOPICAL ANESTHETIC:  Cetacaine Spray  DESCRIPTION OF PROCEDURE:   After the risks benefits and alternatives of the procedure were thoroughly explained, informed consent was obtained.  The Pentax Gastroscope [id number] and EC-[id number] endoscope was introduced through the mouth and advanced to the second portion of the duodenum, without limitations.  The instrument was slowly withdrawn as the mucosa was fully examined. <<PROCEDUREIMAGES>>  LA Grade C esophagitis was found at the gastroesophageal junction. Otherwise normal esophagus.  Moderate erosive gastritis was found antrum with small bits of heme.  Biopsies of the antrum and body of the stomach were obtained and sent to pathology.  The duodenal bulb was normal in appearance, as was the postbulbar duodenum. Random biopsies were obtained and sent to pathology to exclude celiac disease.    Retroflexed views revealed no abnormalities. The scope was then withdrawn from the patient and the procedure completed.  COMPLICATIONS:  None  ENDOSCOPIC IMPRESSION: 1) Esophagitis at the gastroesophageal junction, likely reflux-induced. 2) Otherwise normal esophagus 3) Moderate gastritis in the antrum. Multiple biopsies obtained.  4) Normal duodenum. Multiple biopsies obtained.  RECOMMENDATIONS: 1) Await pathology results 2) avoid NSAIDS 3) follow-up of helicobacter pylori status, treat if indicated 4) BID PPI for now given  gastritis and esophagitis 5) Consider repeat EGD in 12 weeks to document healing of GE junction.  Carie Caddy. Madailein Londo, MD  CC:  The Patient  n. eSIGNED:   Carie Caddy. Loralie Malta at 09/10/2011 12:16 PM  [name], [phone]

## 2011-09-11 ENCOUNTER — Encounter (HOSPITAL_COMMUNITY): Payer: Self-pay | Admitting: Internal Medicine

## 2011-09-11 ENCOUNTER — Encounter (HOSPITAL_COMMUNITY): Payer: Self-pay

## 2011-09-11 ENCOUNTER — Encounter (HOSPITAL_COMMUNITY)
Admission: EM | Disposition: A | Discharge status: Home / Self Care | Payer: Self-pay | Source: Home / Self Care | Attending: Internal Medicine

## 2011-09-11 DIAGNOSIS — K635 Polyp of colon: Secondary | ICD-10-CM | POA: Diagnosis present

## 2011-09-11 DIAGNOSIS — D126 Benign neoplasm of colon, unspecified: Secondary | ICD-10-CM

## 2011-09-11 HISTORY — PX: COLONOSCOPY: SHX5424

## 2011-09-11 LAB — CBC
Hemoglobin: 9.1 g/dL — ABNORMAL LOW (ref 13.0–17.0)
MCH: 28.7 pg (ref 26.0–34.0)
MCHC: 34.3 g/dL (ref 30.0–36.0)
RDW: 14.2 % (ref 11.5–15.5)

## 2011-09-11 LAB — RENAL FUNCTION PANEL
Calcium: 10 mg/dL (ref 8.4–10.5)
Creatinine, Ser: 7.23 mg/dL — ABNORMAL HIGH (ref 0.50–1.35)
GFR calc Af Amer: 9 mL/min — ABNORMAL LOW (ref >90)
GFR calc non Af Amer: 7 mL/min — ABNORMAL LOW (ref >90)
Phosphorus: 8.7 mg/dL — ABNORMAL HIGH (ref 2.3–4.6)
Sodium: 135 mEq/L (ref 135–145)

## 2011-09-11 LAB — PARATHYROID HORMONE, INTACT (NO CA): PTH: 460.8 pg/mL — ABNORMAL HIGH (ref 14.0–72.0)

## 2011-09-11 SURGERY — COLONOSCOPY
Anesthesia: Moderate Sedation

## 2011-09-11 MED ORDER — MIDAZOLAM HCL 5 MG/5ML IJ SOLN
INTRAMUSCULAR | Status: DC | PRN
  Administered 2011-09-11: 2 mg via INTRAVENOUS
  Administered 2011-09-11: 1 mg via INTRAVENOUS
  Administered 2011-09-11: 2 mg via INTRAVENOUS
  Administered 2011-09-11: 1 mg via INTRAVENOUS

## 2011-09-11 MED ORDER — MIDAZOLAM HCL 5 MG/ML IJ SOLN
INTRAMUSCULAR | Status: AC
  Filled 2011-09-11: qty 3

## 2011-09-11 MED ORDER — FENTANYL CITRATE 0.05 MG/ML IJ SOLN
INTRAMUSCULAR | Status: AC
  Filled 2011-09-11: qty 4

## 2011-09-11 MED ORDER — DIPHENHYDRAMINE HCL 50 MG/ML IJ SOLN
INTRAMUSCULAR | Status: AC
  Filled 2011-09-11: qty 1

## 2011-09-11 MED ORDER — FENTANYL CITRATE 0.05 MG/ML IJ SOLN
INTRAMUSCULAR | Status: DC | PRN
  Administered 2011-09-11: 25 ug via INTRAVENOUS
  Administered 2011-09-11: 50 ug via INTRAVENOUS

## 2011-09-11 MED ORDER — DIPHENHYDRAMINE HCL 50 MG/ML IJ SOLN
INTRAMUSCULAR | Status: DC | PRN
  Administered 2011-09-11: 25 mg via INTRAVENOUS

## 2011-09-11 MED ORDER — LISINOPRIL 5 MG PO TABS
5.0000 mg | ORAL_TABLET | Freq: Every day | ORAL | Status: DC
  Administered 2011-09-11 – 2011-09-12 (×2): 5 mg via ORAL
  Filled 2011-09-11 (×2): qty 1

## 2011-09-11 MED ORDER — SODIUM CHLORIDE 0.9 % IV SOLN
INTRAVENOUS | Status: DC
  Administered 2011-09-11: 500 mL via INTRAVENOUS

## 2011-09-11 NOTE — Progress Notes (Signed)
[facility] Progress Note  09/11/2011  Subjective: Pt says he's ready to go home.  He is scheduled for repeat colonoscopy today.    Objective:  Vital signs in last 24 hours: Filed Vitals:   09/10/11 1800 09/10/11 2036 09/11/11 0656 09/11/11 0850  BP: 146/85 148/86 150/96 184/90  Pulse: 60 58 62 64  Temp:  97.5 F (36.4 C) 98 F (36.7 C) 97.1 F (36.2 C)  TempSrc: Oral Oral Oral Oral  Resp: 20 20 18 18   Height:  6\' 2"  (1.88 m)    Weight:  86.728 kg (191 lb 3.2 oz)    SpO2: 99% 98% 100% 100%   Weight change: -1.361 kg (-3 lb)  Intake/Output Summary (Last 24 hours) at 09/11/11 1033 Last data filed at 09/11/11 9604  Gross per 24 hour  Intake    240 ml  Output    550 ml  Net   -310 ml   Lab Results  Component Value Date   HGBA1C 4.7 09/01/2011   Lab Results  Component Value Date   CREATININE 7.23* 09/11/2011    Review of Systems As above, otherwise all reviewed and reported negative  Physical Exam General - awake, no distress, cooperative HEENT - NCAT, MMM Lungs - BBS, CTA CV - normal s1, s2 sounds Abd - soft, nondistended, no masses, nontender Ext - no C/C/E  Lab Results: Results for orders placed during the hospital encounter of 09/01/11 (from the past 24 hour(s))  RENAL FUNCTION PANEL     Status: Abnormal   Collection Time   09/11/11  6:30 AM      Component Value Range   Sodium 135  135 - 145 mEq/L   Potassium 4.2  3.5 - 5.1 mEq/L   Chloride 97  96 - 112 mEq/L   CO2 19  19 - 32 mEq/L   Glucose, Bld 86  70 - 99 mg/dL   BUN 540 (*) 6 - 23 mg/dL   Creatinine, Ser 9.81 (*) 0.50 - 1.35 mg/dL   Calcium 19.1  8.4 - 47.8 mg/dL   Phosphorus 8.7 (*) 2.3 - 4.6 mg/dL   Albumin 3.2 (*) 3.5 - 5.2 g/dL   GFR calc non Af Amer 7 (*) >90 mL/min   GFR calc Af Amer 9 (*) >90 mL/min  CBC     Status: Abnormal   Collection Time   09/11/11  6:30 AM      Component Value Range   WBC 10.0  4.0 - 10.5 K/uL   RBC 3.17 (*) 4.22 - 5.81 MIL/uL   Hemoglobin 9.1 (*) 13.0 -  17.0 g/dL   HCT 29.5 (*) 62.1 - 30.8 %   MCV 83.6  78.0 - 100.0 fL   MCH 28.7  26.0 - 34.0 pg   MCHC 34.3  30.0 - 36.0 g/dL   RDW 65.7  84.6 - 96.2 %   Platelets 383  150 - 400 K/uL    Micro Results: Recent Results (from the past 240 hour(s))  MRSA PCR SCREENING     Status: Normal   Collection Time   09/01/11  5:43 PM      Component Value Range Status Comment   MRSA by PCR NEGATIVE  NEGATIVE Final     Medications:  Scheduled Meds:   . amLODipine  5 mg Oral Daily  . bisacodyl  5 mg Oral BID  . calcium acetate  1,334 mg Oral TID WC  . cyclophosphamide  150 mg Oral QAC breakfast  . darbepoetin (ARANESP) injection -  NON-DIALYSIS  100 mcg Subcutaneous Q Fri-1800  . ferumoxytol  510 mg Intravenous Q3 days  . furosemide  40 mg Oral BID  . hydrALAZINE  25 mg Oral Q8H  . metoprolol tartrate  25 mg Oral BID  . pantoprazole  40 mg Oral BID AC  . polyethylene glycol-electrolytes  2,000 mL Oral Once  . predniSONE  80 mg Oral Q breakfast  . sodium bicarbonate  650 mg Oral BID  . sodium chloride  3 mL Intravenous Q12H  . DISCONTD: pantoprazole  40 mg Oral Q0600   Continuous Infusions:   . sodium chloride    . DISCONTD: sodium chloride 20 mL/hr (09/10/11 1024)   PRN Meds:.albuterol, guaiFENesin-dextromethorphan, LORazepam, ondansetron (ZOFRAN) IV, ondansetron, DISCONTD: butamben-tetracaine-benzocaine, DISCONTD: fentaNYL, DISCONTD: midazolam  Assessment/Plan: Pt is 56 yo male who was admitted 09/01/2011 with generalized weakness and was found to be severely anemic with Hg 4.3 and in acute renal failure with Cr 13, has required 4 units of blood transfusion. 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 Patient was transfer [facility] for renal biopsy and further care. Patient renal function stable, no need for dialysis at this time. Renal biopsy pending. Patient hb has remain stable GI is planing endoscopy, colonoscopy 8-14. Repeat colonoscopy on 8/15 due to poor  prep.    1-Severe Anemia:  Iron deficiency vs Anemia of chronic diseases.  -This could be multifactorial, secondary to renal failure, occult GI bleed. Needs endoscopy / colonoscopy rule out gastropathy or colon cancer at some point. If not clear evidence of anemia is found, he will need at some point hematology evaluation, ? Bone marrow Bx. although no leukopenia or thrombocytopenia.  - no active bleeding noted per pt or while in the hospital  - will continue to follow up on daily CBC.  -B-12 385, Folate 7.7, Iron 23, ferritin 79  -EPO level at 53.  -Aranesp.  -Hb stable.   2- GI bleed occult?  -Guaiac stool positive.  - unclear etiology, GI following, no clear source identified at this point  -Endoscopy - esophagitis / gastritis noted, Repeat Colonoscopy planned for 8-15.   3-Acute renal failure , non oliguric. acute vs subacute vs chronic;  - Anti PR3 + and questionable Wegener's ? Vs granulomatosis with polyangiitis, cocaine (? Levamisole nephropathy)  -Cr trending down.  - nephrology following  - started on Solumedrol (8-8 ) No w on prednisone and Cytoxan (8-9) per nephrology team  -S/P Renal Biopsy 8-9.  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 Shirlean Schlein at [facility]). 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  4-Diffuse bilateral Pulmonary infiltrates of uncertain etiology  - pt maintaining oxygen saturations > 95% on RA  -Patient had renal biopsy to rule out Wegener.  -Cytoxan and High dose steroids started  - Chest x ray 8-12 : Resolution of edema pattern with effusions. Probable chronic  interstitial change remains. Recommend continued follow-up.  Pulmonary Signed off.   5-Bradycardia: improved holder parameter for metoprolol. Metoprolol dose decreased 25 mg bid.   6-Hypertension: suboptimal control.  Hydralazine 25 mg po tid.  Cont to monitor.   Cocaine use  - cessation discussed with  pt  - will continue to address with pt   Consultants:  PCCM : Sign off.  Nephrology  GI  Labs:  ANA 8/6 --> positive  Double stranded anti-DNA AB 8/6 --> <1  antistrepolysin 8/6 --> <25  C3 8/6 -->  105  C4 8/6 --> 17  GBM ab 8/6 --> < 13  HIV 8/6 --> non-reactive  ANCA 8/6 -- 1;6 elevated  Urine drug 8/6 --> cocaine positive  Haptoglobin 8/6 --> 297  Mpo/pr-3 (anca) Myeloperoxidase Abs 68 (H) <20 AU/mL Serine Protease 3 748 (H)   LOS: 10 days   Bay Jarquin 09/11/2011, 10:33 AM  Murlean Iba, MD, CDE, [facility] [place], [place]

## 2011-09-11 NOTE — Progress Notes (Signed)
Call placed to Brett Canales, PA to request clarification on patient AM Medications.  OK to give AM medications.

## 2011-09-11 NOTE — Progress Notes (Signed)
Subjective:   Patient does not have any complaints, No nausea or vomiting, feeling good, no SOB.    UOP: increased 700 ml  Did EGD with H. Pylori biopsy, esophagitis at the gastroesophageal junction.  Due to poor prep, colonoscopy will be repeated today (8/15).  Creatinine trends down to 7.23.  Objective: Vital signs in last 24 hours: Filed Vitals:   09/10/11 1400 09/10/11 1800 09/10/11 2036 09/11/11 0656  BP: 132/84 146/85 148/86 150/96  Pulse: 60 60 58 62  Temp: 97.4 F (36.3 C)  97.5 F (36.4 C) 98 F (36.7 C)  TempSrc: Oral Oral Oral Oral  Resp: 20 20 20 18   Height:   6\' 2"  (1.88 m)   Weight:   191 lb 3.2 oz (86.728 kg)   SpO2: 99% 99% 98% 100%   Weight change: -3 lb (-1.361 kg)  Intake/Output Summary (Last 24 hours) at 09/11/11 0838 Last data filed at 09/10/11 1300  Gross per 24 hour  Intake    480 ml  Output    300 ml  Net    180 ml   UOP: 900 ml.  Vitals: T: 98.4   HR: 62   BP:143/86   RR:20     O2 saturation:100%  General: resting in bed, not in acute distress HEENT: PERRL, EOMI, no scleral icterus Cardiac: S1/S2, RRR, No murmurs, gallops or rubs Pulm: Mild coarseness at base, No rales, wheezing, rhonchi or rubs. Abd: Soft,  nondistended, nontender, no rebound pain, no organomegaly, BS present Ext: No rashes or edema, 2+DP/PT pulse bilaterally Musculoskeletal: No joint deformities, erythema, or stiffness, ROM full and no nontender Skin: no rashes. No skin bruise. Neuro: alert and oriented X3, cranial nerves II-XII grossly intact, muscle strength 5/5 in all extremeties,  sensation to light touch intact.   Lab Results: Basic Metabolic Panel:  Lab 09/11/11 9604 09/10/11 0550  NA 135 138  K 4.2 4.3  CL 97 100  CO2 19 21  GLUCOSE 86 93  BUN PENDING 152*  CREATININE 7.23* 7.72*  CALCIUM 10.0 9.5  MG -- --  PHOS 8.7* 8.6*   Liver Function Tests:  Lab 09/11/11 0630 09/10/11 0550  AST -- --  ALT -- --  ALKPHOS -- --  BILITOT -- --  PROT -- --    ALBUMIN 3.2* 3.0*   CBC:  Lab 09/11/11 0630 09/09/11 0545 09/08/11 0625 09/06/11 0621  WBC 10.0 11.7* -- --  NEUTROABS -- -- 10.3* 11.7*  HGB 9.1* 7.9* -- --  HCT 26.5* 22.6* -- --  MCV 83.6 82.8 -- --  PLT 383 332 -- --   CBG:  Lab 09/04/11 1811  GLUCAP 126*    Drugs of Abuse     Component Value Date/Time   LABOPIA NONE DETECTED 09/02/2011 1555   COCAINSCRNUR POSITIVE* 09/02/2011 1555   LABBENZ NONE DETECTED 09/02/2011 1555   AMPHETMU NONE DETECTED 09/02/2011 1555   THCU NONE DETECTED 09/02/2011 1555   LABBARB NONE DETECTED 09/02/2011 1555    Micro Results: Recent Results (from the past 240 hour(s))  MRSA PCR SCREENING     Status: Normal   Collection Time   09/01/11  5:43 PM      Component Value Range Status Comment   MRSA by PCR NEGATIVE  NEGATIVE Final    Studies/Results: No results found. Medications:  Scheduled Meds:    . amLODipine  5 mg Oral Daily  . bisacodyl  5 mg Oral BID  . bisacodyl  5 mg Oral BID  . calcium  acetate  1,334 mg Oral TID WC  . cyclophosphamide  150 mg Oral QAC breakfast  . darbepoetin (ARANESP) injection - NON-DIALYSIS  100 mcg Subcutaneous Q Fri-1800  . ferumoxytol  510 mg Intravenous Q3 days  . furosemide  40 mg Oral BID  . hydrALAZINE  25 mg Oral Q8H  . metoprolol tartrate  25 mg Oral BID  . pantoprazole  40 mg Oral BID AC  . polyethylene glycol-electrolytes  2,000 mL Oral Once  . predniSONE  80 mg Oral Q breakfast  . sodium bicarbonate  650 mg Oral BID  . sodium chloride  3 mL Intravenous Q12H  . DISCONTD: pantoprazole  40 mg Oral Q0600   Continuous Infusions:    . sodium chloride    . DISCONTD: sodium chloride 20 mL/hr (09/10/11 1024)   PRN Meds:.albuterol, guaiFENesin-dextromethorphan, LORazepam, ondansetron (ZOFRAN) IV, ondansetron, DISCONTD: butamben-tetracaine-benzocaine, DISCONTD: fentaNYL, DISCONTD: midazolam Assessment/Plan:  # Renal failure - Acute vs subacute vs chronic; negative [place]; UA showed granular casts, microscopic  hematuria, and mild proteinuria;+ PR3 along with hematuria and lung findings suggestive of acute GN (GPA) although drug screen + for cocaine (? Levamisole nephropathy); began empiric treatment with Solumedrol and Cytoxan. Chest x ray on 8/12 showed resolution of edema pattern with effusions and probable chronic interstitial change remains. 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 Jeannetta Nap at [facility]).  The is some focal and segmental glomerulosclerosis as well. PTH pending  Ceatinine trends down from 13.32 on admission to 7.23. UOP 700 ml. No uremic signs or symptoms.    - Will continue oral prednisone 80 mg daily and Cytoxan 150 mg daily.   # Anemia - s/p 4 units on 8/5 for Hgb 4.3, Iron 23, Ferritin 79,  Fe sat 8%; heme + stools, LDH and haptoglobin high, plts normal; Hgb is 7.9 today (8/13). EGD with H. Pylori biopsy on 8/14 showed esophagitis at the gastroesophageal junction.On aranesp 100/wk.  He got 2 doses of Feraheme.  -Began Aranesp 100 mcg on 09/05/11 and IV iron on 8/12.  - will get colonoscopy today.   # HTN:  Was on lasix, hydralazine and metoprolol. Added amlodipine on 8/13 due to elevated bp. Today bp is 150/96. Per primary team   [name], MD PGY2, Internal Medicine Teaching Service Pager: [phone]  Mr. Ashley Royalty is anxious to go home.  Cr continues to fall.  Colonoscopy to be attempted again today.  PPI started for esophagitis seen on EGD. Will begin lisinopril for better BP control (we can check K and Cr easily in hospital).

## 2011-09-11 NOTE — Progress Notes (Signed)
Pt says he had brown stool, eventually turned clear as Prep # 2 progressed. He assures me that he is well prepped.  About 1 inch of prep remains at bottom of Jug.  Hgb is 9.1  For colonoscopy today.   Jennye Moccasin

## 2011-09-12 ENCOUNTER — Encounter: Payer: Self-pay | Admitting: Internal Medicine

## 2011-09-12 ENCOUNTER — Encounter (HOSPITAL_COMMUNITY): Payer: Self-pay | Admitting: Internal Medicine

## 2011-09-12 DIAGNOSIS — K5730 Diverticulosis of large intestine without perforation or abscess without bleeding: Secondary | ICD-10-CM

## 2011-09-12 DIAGNOSIS — K5790 Diverticulosis of intestine, part unspecified, without perforation or abscess without bleeding: Secondary | ICD-10-CM | POA: Diagnosis present

## 2011-09-12 LAB — CBC
HCT: 25 % — ABNORMAL LOW (ref 39.0–52.0)
MCHC: 35.2 g/dL (ref 30.0–36.0)
RBC: 2.99 MIL/uL — ABNORMAL LOW (ref 4.22–5.81)
WBC: 13.2 10*3/uL — ABNORMAL HIGH (ref 4.0–10.5)

## 2011-09-12 LAB — RENAL FUNCTION PANEL
BUN: 154 mg/dL — ABNORMAL HIGH (ref 6–23)
Chloride: 97 mEq/L (ref 96–112)
Glucose, Bld: 107 mg/dL — ABNORMAL HIGH (ref 70–99)
Phosphorus: 8 mg/dL — ABNORMAL HIGH (ref 2.3–4.6)
Potassium: 4.1 mEq/L (ref 3.5–5.1)

## 2011-09-12 MED ORDER — LISINOPRIL 10 MG PO TABS
10.0000 mg | ORAL_TABLET | Freq: Every day | ORAL | Status: DC
  Administered 2011-09-13: 10 mg via ORAL
  Filled 2011-09-12 (×2): qty 1

## 2011-09-12 MED ORDER — LISINOPRIL 5 MG PO TABS
5.0000 mg | ORAL_TABLET | Freq: Once | ORAL | Status: AC
  Administered 2011-09-12: 5 mg via ORAL
  Filled 2011-09-12 (×2): qty 1

## 2011-09-12 MED ORDER — CALCIUM ACETATE 667 MG PO CAPS
2001.0000 mg | ORAL_CAPSULE | Freq: Three times a day (TID) | ORAL | Status: DC
  Administered 2011-09-12 – 2011-09-13 (×2): 2001 mg via ORAL
  Filled 2011-09-12 (×5): qty 3

## 2011-09-12 NOTE — Op Note (Signed)
[facility] [address] [place], [place]  [address]  COLONOSCOPY PROCEDURE REPORT  PATIENT:  Alexander Grant, Alexander Grant  MR#:  [id number] BIRTHDATE:  [birth year], 56 yrs. old  GENDER:  male ENDOSCOPIST:  Carie Caddy. Haydin Dunn, MD REF. BY:  [facility], PROCEDURE DATE:  09/11/2011 PROCEDURE:  Colonoscopy with biopsy and snare polypectomy ASA CLASS:  Class III INDICATIONS:  heme positive stool, Anemia MEDICATIONS:   Benadryl 25 mg IV, Fentanyl 75 mcg IV, Versed 6 mg IV  DESCRIPTION OF PROCEDURE:   After the risks benefits and alternatives of the procedure were thoroughly explained, informed consent was obtained.  DRE revealed polyp-like lesion distal rectum. The a and EC-[id number] ([id number]) endoscope was introduced through the anus and advanced to the cecum, which was identified by both the appendix and ileocecal valve, limited by poor preparation.    The quality of the prep was poor, using Colyte. The instrument was then slowly withdrawn as the colon was fully examined. <<PROCEDUREIMAGES>> FINDINGS:  Poor preparation despite 2 doses of GoLYTELY.  Five sessile polyps measuring 3-6 mm were found in the ascending colon and transverse colon. Polyps were snared without cautery. Retrieval was successful.   A sessile polyp-like lesion was found in the distal rectum near the dentate line. With standard forceps, biopsy was obtained and sent to pathology.  Mild diverticulosis was found in the sigmoid colon.  Retroflexed views in the rectum revealed no other findings other than those already describ ed.  The scope was then withdrawn from the cecum and the procedure completed.  COMPLICATIONS:  None  ENDOSCOPIC IMPRESSION: 1) Five polyps in the ascending colon and transverse colon. Removed and sent to pathology 2) Sessile polyp-like lesion in the distal rectum near the dentate line. Biopsy obtained and sent to pathology. 3) Mild diverticulosis in the sigmoid colon 4) Poor prep  which limits sensitivity of colonoscopy in the detection of colon polyps  RECOMMENDATIONS: 1) Hold aspirin, aspirin products, and anti-inflammatory medication for 2 weeks. 2) Await pathology results 3) High fiber diet. 4) Colonoscopy needs to be repeated as an outpatient given polyps found today along with poor preparation. This should be performed within 6 months.  Carie Caddy. Haruye Lainez, MD  CC:  The Patient  n. eSIGNED:   Carie Caddy. Lunetta Marina at 09/11/2011 05:40 PM  [name], [phone]

## 2011-09-12 NOTE — Progress Notes (Signed)
[facility]  Subjective:   [name] says he's leaving tomorrow regardless of what I say.  I've arranged for him to get lab on Monday (renal pro and CBC) and to see Dr. Kathrene Grant 3:15 on Tuesday 20 Aug.     Objective: Vital signs in last 24 hours: Blood pressure 145/80, pulse 64, temperature 97.9 Grant (36.6 C), temperature source Oral, resp. rate 18, height 6\' 2"  (1.88 m), weight 85.821 kg (189 lb 3.2 oz), SpO2 98.00%.    PHYSICAL EXAM General--as above Chest--clear Heart--no rub Abd--nontender Extr--no edema  Lab Results:   Lab 09/12/11 0700 09/11/11 0630 09/10/11 0550  NA 137 135 138  K 4.1 4.2 4.3  CL 97 97 100  CO2 20 19 21   BUN 154* 152* 152*  CREATININE 6.95* 7.23* 7.72*  ALB -- -- --  GLUCOSE 107* -- --  CALCIUM 9.6 10.0 9.5  PHOS 8.0* 8.7* 8.6*     Basename 09/12/11 0700 09/11/11 0630  WBC 13.2* 10.0  HGB 8.8* 9.1*  HCT 25.0* 26.5*  PLT 364 383    Assessment/Plan: 1.  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 Jeannetta Grant at [facility]). The is some focal and segmental glomerulosclerosis as well. Creatinine trends down from 13.32 on admission to 6.95 today. UOP 1100 ml.yesterday.   No uremic signs or symptoms.  - Will continue oral prednisone 80 mg daily and Cytoxan 150 mg daily.  2.   Anemia - s/p 4 units on 8/5 for Hgb 4.3, Iron 23, Ferritin 79, Fe sat 8%; heme + stools, LDH and haptoglobin high, plts normal; Hgb is 8.8 today. EGD with H. Pylori biopsy on 8/14 showed esophagitis at the gastroesophageal junction.  Colonoscopy today showed several polyps (Grant/u Dr. Rhea Grant). On aranesp 100/wk. He got 2 doses of Feraheme. Will need to have aranesp as outpatient -Began Aranesp 100 mcg on 09/05/11 and IV iron on 8/12.   3.   HTN: d/c hydralazine.  Amlodipine 5/d.  Lisinopril started yesterday with good BP response and Cr didn't increase.  Continue meds 4.  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  PhosLo increased to 3 TID  5.  Immunosuppression--hgb 8.8, WBC 13,200, plts 364K  I told him he should stay so we can make sure cytoxan is not affecting marrow, so we can make sure lisinopril is not affecting Cr and K, that he doesn't become uremic (BUN >150 on prednisone).  He says he will leave tomorrow regardless.  I've arranged for Grant/u in office--I hope he will show up and I hope he doesn't use cocaine again   LOS: 11 days   Alexander Grant 09/12/2011,12:44 PM   .labalb

## 2011-09-12 NOTE — Progress Notes (Signed)
Alexander Grant Progress Note  09/12/2011   Subjective: Pt without complaints today, tolerating diet well.   Objective:  Vital signs in last 24 hours: Filed Vitals:   09/11/11 1829 09/11/11 2207 09/12/11 0452 09/12/11 1055  BP: 144/94 157/68 173/84 145/80  Pulse:  63 63 64  Temp: 97.9 F (36.6 C) 97.5 F (36.4 C) 97.9 F (36.6 C) 97.9 F (36.6 C)  TempSrc: Oral Oral Oral Oral  Resp: 20 18 17 18   Height:      Weight:  85.821 kg (189 lb 3.2 oz)    SpO2: 97% 100% 98% 98%   Weight change: -0.907 kg (-2 lb)  Intake/Output Summary (Last 24 hours) at 09/12/11 1233 Last data filed at 09/12/11 2353  Gross per 24 hour  Intake    240 ml  Output    851 ml  Net   -611 ml   Lab Results  Component Value Date   HGBA1C 4.7 09/01/2011   Lab Results  Component Value Date   CREATININE 6.95* 09/12/2011    Review of Systems As above, otherwise all reviewed and reported negative  Physical Exam General - awake, no distress, cooperative HEENT - NCAT, MMM Lungs - BBS, CTA CV - normal s1, s2 sounds Abd - soft, nondistended, no masses, nontender Ext - no cyanosis  Lab Results: Results for orders placed during the hospital encounter of 09/01/11 (from the past 24 hour(s))  CBC     Status: Abnormal   Collection Time   09/12/11  7:00 AM      Component Value Range   WBC 13.2 (*) 4.0 - 10.5 K/uL   RBC 2.99 (*) 4.22 - 5.81 MIL/uL   Hemoglobin 8.8 (*) 13.0 - 17.0 g/dL   HCT 25.0 (*) 39.0 - 52.0 %   MCV 83.6  78.0 - 100.0 fL   MCH 29.4  26.0 - 34.0 pg   MCHC 35.2  30.0 - 36.0 g/dL   RDW 14.2  11.5 - 15.5 %   Platelets 364  150 - 400 K/uL  RENAL FUNCTION PANEL     Status: Abnormal   Collection Time   09/12/11  7:00 AM      Component Value Range   Sodium 137  135 - 145 mEq/L   Potassium 4.1  3.5 - 5.1 mEq/L   Chloride 97  96 - 112 mEq/L   CO2 20  19 - 32 mEq/L   Glucose, Bld 107 (*) 70 - 99 mg/dL   BUN 154 (*) 6 - 23 mg/dL   Creatinine, Ser 6.95 (*) 0.50 - 1.35 mg/dL   Calcium  9.6  8.4 - 10.5 mg/dL   Phosphorus 8.0 (*) 2.3 - 4.6 mg/dL   Albumin 3.0 (*) 3.5 - 5.2 g/dL   GFR calc non Af Amer 8 (*) >90 mL/min   GFR calc Af Amer 9 (*) >90 mL/min    Micro Results: No results found for this or any previous visit (from the past 240 hour(s)).  Medications:  Scheduled Meds:   . amLODipine  5 mg Oral Daily  . calcium acetate  1,334 mg Oral TID WC  . cyclophosphamide  150 mg Oral QAC breakfast  . darbepoetin (ARANESP) injection - NON-DIALYSIS  100 mcg Subcutaneous Q Fri-1800  . ferumoxytol  510 mg Intravenous Q3 days  . furosemide  40 mg Oral BID  . hydrALAZINE  25 mg Oral Q8H  . lisinopril  5 mg Oral Daily  . metoprolol tartrate  25 mg Oral BID  .  pantoprazole  40 mg Oral BID AC  . predniSONE  80 mg Oral Q breakfast  . sodium bicarbonate  650 mg Oral BID  . sodium chloride  3 mL Intravenous Q12H   Continuous Infusions:   . DISCONTD: sodium chloride    . DISCONTD: sodium chloride 500 mL (09/11/11 1529)   PRN Meds:.albuterol, guaiFENesin-dextromethorphan, LORazepam, ondansetron (ZOFRAN) IV, ondansetron, DISCONTD: diphenhydrAMINE, DISCONTD: fentaNYL, DISCONTD: midazolam  Assessment/Plan: Pt is 56 yo male who was admitted 09/01/2011 with generalized weakness and was found to be severely anemic with Hg 4.3 and in acute renal failure with Cr 13, has required 4 units of blood transfusion. 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 Patient was transfer [facility] for renal biopsy and further care. Patient renal function stable, no need for dialysis at this time. Renal biopsy pending. Patient hb has remain stable GI is planing endoscopy, colonoscopy 8-14. Repeat colonoscopy on 8/15 revealed multiple colon polyps.   1-Severe Anemia:  Iron deficiency vs Anemia of chronic diseases.  -This could be multifactorial, secondary to renal failure, occult GI bleed. Needs endoscopy / colonoscopy rule out gastropathy or colon cancer at some point. If  not clear evidence of anemia is found, he will need at some point hematology evaluation, ? Bone marrow Bx. although no leukopenia or thrombocytopenia.  - no active bleeding noted per pt or while in the hospital  - will continue to follow up on daily CBC.  -B-12 385, Folate 7.7, Iron 23, ferritin 79  -EPO level at 53.  -Aranesp.  -Hb stable.   2- GI bleed occult?  -Guaiac stool positive.  - unclear etiology, likely from multiple colon polyps and esophagitis/gastritis seen on GI studies   -Endoscopy - esophagitis / gastritis noted, Colonoscopy 8/15.   3-Acute renal failure , non oliguric. acute vs subacute vs chronic;  - Anti PR3 + and questionable Wegener's ? Vs granulomatosis with polyangiitis, cocaine (? Levamisole nephropathy)  -Cr trending down.  - nephrology following  - started on Solumedrol (8-8 ) No w on prednisone and Cytoxan (8-9) per nephrology team  -S/P Renal Biopsy 8-9. 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 Jeannetta Nap at [facility]). 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   4-Diffuse bilateral Pulmonary infiltrates of uncertain etiology  - pt maintaining oxygen saturations > 95% on RA  -Patient had renal biopsy to rule out Wegener.  -Cytoxan and High dose steroids started  - Chest x ray 8-12 : Resolution of edema pattern with effusions. Probable chronic  interstitial change remains. Recommend continued follow-up.  Pulmonary Signed off.   5-Bradycardia: improved holder parameter for metoprolol. Metoprolol dose decreased 25 mg bid.   6-Hypertension: suboptimal control. Hydralazine 25 mg po tid. Cont to monitor.   Cocaine use  - cessation discussed with pt  - will continue to address with pt   Consultants:  PCCM : Sign off.  Nephrology  GI  Labs:  ANA 8/6 --> positive  Double stranded anti-DNA AB 8/6 --> <1  antistrepolysin 8/6 --> <25  C3 8/6 --> 105  C4 8/6 --> 17  GBM  ab 8/6 --> < 13  HIV 8/6 --> non-reactive  ANCA 8/6 -- 1;6 elevated  Urine drug 8/6 --> cocaine positive  Haptoglobin 8/6 --> 297  Mpo/pr-3 (anca) Myeloperoxidase Abs 68 (H) <20 AU/mL Serine Protease 3 748 (H)   LOS: 11 days   Clanford Johnson 09/12/2011, 12:33 PM  Murlean Iba, MD, CDE, [place], [place]

## 2011-09-13 DIAGNOSIS — N059 Unspecified nephritic syndrome with unspecified morphologic changes: Secondary | ICD-10-CM | POA: Diagnosis present

## 2011-09-13 LAB — CBC
MCH: 29.5 pg (ref 26.0–34.0)
MCV: 84.4 fL (ref 78.0–100.0)
Platelets: 359 10*3/uL (ref 150–400)
RDW: 14.4 % (ref 11.5–15.5)
WBC: 11.7 10*3/uL — ABNORMAL HIGH (ref 4.0–10.5)

## 2011-09-13 LAB — BASIC METABOLIC PANEL
Calcium: 10.2 mg/dL (ref 8.4–10.5)
Creatinine, Ser: 6.66 mg/dL — ABNORMAL HIGH (ref 0.50–1.35)
GFR calc Af Amer: 10 mL/min — ABNORMAL LOW (ref >90)
GFR calc non Af Amer: 8 mL/min — ABNORMAL LOW (ref >90)

## 2011-09-13 MED ORDER — OMEPRAZOLE 40 MG PO CPDR: 40.0000 mg | DELAYED_RELEASE_CAPSULE | Freq: Two times a day (BID) | ORAL | Status: DC

## 2011-09-13 MED ORDER — METOPROLOL TARTRATE 25 MG PO TABS: 25.0000 mg | ORAL_TABLET | Freq: Two times a day (BID) | ORAL | Status: DC

## 2011-09-13 MED ORDER — AMLODIPINE BESYLATE 5 MG PO TABS: 5.0000 mg | ORAL_TABLET | Freq: Every day | ORAL | Status: DC

## 2011-09-13 MED ORDER — SODIUM BICARBONATE 650 MG PO TABS: 650.0000 mg | ORAL_TABLET | Freq: Two times a day (BID) | ORAL | Status: AC

## 2011-09-13 MED ORDER — LISINOPRIL 10 MG PO TABS: 10.0000 mg | ORAL_TABLET | Freq: Every day | ORAL | Status: DC

## 2011-09-13 MED ORDER — CALCIUM ACETATE 667 MG PO CAPS: 2001.0000 mg | ORAL_CAPSULE | Freq: Three times a day (TID) | ORAL | Status: AC

## 2011-09-13 MED ORDER — CYCLOPHOSPHAMIDE 50 MG PO TABS: 150.0000 mg | ORAL_TABLET | Freq: Every day | ORAL | Status: DC

## 2011-09-13 MED ORDER — FUROSEMIDE 40 MG PO TABS: 40.0000 mg | ORAL_TABLET | Freq: Two times a day (BID) | ORAL | Status: DC

## 2011-09-13 MED ORDER — PREDNISONE 20 MG PO TABS: 80.0000 mg | ORAL_TABLET | Freq: Every day | ORAL | Status: AC

## 2011-09-13 NOTE — Discharge Summary (Signed)
Physician Discharge Summary  Patient ID: Alexander Grant MRN: [id number] DOB/AGE: December 31, 1955 56 y.o.  Admit date: 09/01/2011 Discharge date: 09/13/2011  Discharge Diagnoses:     Pauci-immune necrotizing Glomerulonephritis with crescents (high activity/low chronicity according to Dr. Jeannetta Nap at [facility]).  Focal and segmental glomerulosclerosis  *Acute blood loss anemia  GI bleed  Acute renal failure  Exertional shortness of breath  Cocaine use  Pulmonary infiltrate  Hypoxemia  Hematest positive stools  Esophagitis, reflux  Erosive gastritis  Colon polyps  Diverticulosis  Right thyroid nodule  Recommendations for provider on follow up:  Please recheck CBC, BMP, follow up biopsies from GI studies, Please follow up thyroid nodule  Discharged Condition: stable, good  Hospital Course: From H&P:  Alexander Grant is a 56 y.o. male, who is essentially healthy, used cocaine in the past, kidney stones status post stent placement many years ago, had a soft tissue abscess I&D done 3 years ago in this hospital, at that time had mild anemia and a normal creatinine. He denies any other medical problems, does use NSAIDs on an infrequent basis, he today went to work where his colleagues told him that he looked pale, he also complains of some exertional shortness of breath ongoing for the last few weeks, he also complains of some generalized weakness and says he's been getting fatigued easily. Came to the ER for a above dictated symptoms, in the ER patient was found to be Hemoccult positive, creatinine of 13, his chest x-ray was suggestive of mild fluid congestion, however on physical exam he has no rales or JVD, has no peripheral edema. Patient says that he still making good urine, is not nauseated and has good appetite. Please see complete hospital records for full details of this complicated hospitalization.   -Severe Anemia:  Iron deficiency vs Anemia of chronic diseases.  -This could be  multifactorial, secondary to renal failure, occult GI bleed. Needs endoscopy / colonoscopy rule out gastropathy or colon cancer at some point. If not clear evidence of anemia is found, he will need at some point hematology evaluation, ? Bone marrow Bx. although no leukopenia or thrombocytopenia.  - no active bleeding noted per pt or while in the hospital  - will continue to follow up on daily CBC.  -B-12 385, Folate 7.7, Iron 23, ferritin 79  -EPO level at 53.  -Aranesp.  -Hb stable. Recommend rechecking in 2 days with nephrology office as scheduled  2- GI bleed occult  - Guaiac stool positive.  - likely from multiple colon polyps and esophagitis/gastritis seen on GI studies  - pt being discharged on omeprazole 40 mg po bid  - biopsies pending at discharge and pt will need a repeat colonoscopy in 6 months -Endoscopy - esophagitis / gastritis noted, Colonoscopy 8/15.   3-Acute renal failure  -S/P Renal Biopsy 8-9. 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 Jeannetta Nap at [facility]). 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  -Cr trending down, now 6.6 on discharge day  - nephrology following and pt to see Dr. Kathrene Bongo in 3 days in office with labs ordered for 2 days for CBC and BMP - discharged on prednisone 80 mg daily and cytoxan 150 mg daily  4-Diffuse bilateral Pulmonary infiltrates of uncertain etiology  - pt maintaining oxygen saturations > 95% on RA  -Patient had renal biopsy to rule out Wegener.  -Cytoxan and High dose steroids started  - Chest x ray 8-12 : Resolution of  edema pattern with effusions. Probable chronic  interstitial change remains. Recommend continued follow-up.  Pulmonary Signed off.  Recommend pt follow up with pulmonology on discharge  5-Bradycardia: improved holder parameter for metoprolol. Metoprolol dose decreased 25 mg bid.   6-Hypertension: discharged on lisinopril (Cr and  K holding stable), repeat on Monday 8/19 with nephrology  Cocaine use  - cessation discussed with pt  - pt counseled in hospital not to use recreational drugs   Consultants:  PCCM : Sign off.  Nephrology  GI  Labs:  ANA 8/6 --> positive  Double stranded anti-DNA AB 8/6 --> <1  antistrepolysin 8/6 --> <25  C3 8/6 --> 105  C4 8/6 --> 17  GBM ab 8/6 --> < 13  HIV 8/6 --> non-reactive  ANCA 8/6 -- 1;6 elevated  Urine drug 8/6 --> cocaine positive  Haptoglobin 8/6 --> 297  Mpo/pr-3 (anca) Myeloperoxidase Abs 68 (H) <20 AU/mL Serine Protease 3 748 (H)   Consults: nephrology, gastroenterology (Pyrtle)  STUDIES ECHOCARDIOGRAM Study Conclusions - Left ventricle: The cavity size was normal. There was moderate concentric hypertrophy. Systolic function was normal. The estimated ejection fraction was in the range of 60% to 65%. Wall motion was normal; there were no regional wall motion abnormalities. Doppler parameters are consistent with abnormal left ventricular relaxation (grade 1 diastolic dysfunction). The E/e' ratio is >10, suggesting elevated LV filling pressure.  - Aortic valve: Trivial regurgitation. - Mitral valve: Mildly thickened leaflets . - Left atrium: Moderate to severely dilated. - Right ventricle: The cavity size was mildly dilated. - Right atrium: Moderately dilated. - Atrial septum: No defect or patent foramen ovale was identified. - Systemic veins: The IVC was not visualized.  Renal US IMPRESSION:  Normal study.  CT LUNGS IMPRESSION:  Interstitial and ground-glass opacities throughout the lungs with  relative sparing of the bases. This is nonspecific and could  represent pulmonary edema, hemorrhage, infection or ARDS.  Small bilateral pleural effusions.  Coronary artery disease.  Right thyroid nodule. This can be further characterized with  elective thyroid ultrasound.   Discharge Exam: Pt without complaints, says he is going home today. He says he will  follow up and take meds as recommended and get prescriptions filled and will not use any recreational drugs or NSAIDS Blood pressure 134/70, pulse 69, temperature 98.5 F (36.9 C), temperature source Oral, resp. rate 16, height 6\' 2"  (1.88 m), weight 85.911 kg (189 lb 6.4 oz), SpO2 94.00%. General - awake, no distress, cooperative  HEENT - NCAT, MMM Lungs - BBS, CTA  CV - normal s1, s2 sounds  Abd - soft, nondistended, no masses, nontender  Ext - no cyanosis or edema Neuro - nonfocal, A&O x 4  Disposition: 01-Home or Self Care  Discharge Orders    Future Appointments: Provider: Department: Dept Phone: Center:   11/11/2011 1:30 PM Lafayette Dragon, MD [facility] Office [phone] [facility]     Future Orders Please Complete By Expires   Increase activity slowly      Discharge instructions      Comments:   AVOID ALL NSAID MEDICATIONS INCLUDING ASPIRIN, IBUPROFEN, GOODY POWDER, NAPROXEN, ETC PLEASE FOLLOW UP AS RECOMMENDED FOLLOW UP YOUR BIOPSY RESULTS WITH STOMACH SPECIALIST AS RECOMMENDED PLEASE FOLLOW UP WITH KIDNEY SPECIALIST AS RECOMMENDED ON Tuesday 3:15 PM.  GO GET LABS DONE ON Monday CBC AND BMP AT THE OFFICE PLEASE SEE YOUR PRIMARY CARE PHYSICIAN IN 1 WEEK   GO TO ER OR SEEK MEDICAL CARE IF SYMPTOMS RETURN, DON'T IMPROVE,  WORSEN OR NEW PROBLEMS DEVELOP FOLLOW UP WITH STOMACH DOCTOR TO GET BIOPSY RESULTS AND HAVE REPEAT COLONSCOPY IN 6 MONTHS HAVE THYROID NODULE CHECKED OUT FURTHER WITH YOUR PRIMARY CARE PHYSICIAN   DO NOT USE ANY RECREATIONAL DRUGS     Medication List  As of 09/13/2011  9:49 AM   STOP taking these medications         naproxen sodium 220 MG tablet      NYQUIL 60-7.06-25-998 MG/30ML Liqd         TAKE these medications         amLODipine 5 MG tablet   Commonly known as: NORVASC   Take 1 tablet (5 mg total) by mouth daily.      calcium acetate 667 MG capsule   Commonly known as: PHOSLO   Take 3 capsules (2,001 mg total) by mouth 3 (three) times  daily with meals.      cyclophosphamide 50 MG tablet   Commonly known as: CYTOXAN   Take 3 tablets (150 mg total) by mouth daily before breakfast. Give on an empty stomach 1 hour before or 2 hours after meals.      furosemide 40 MG tablet   Commonly known as: LASIX   Take 1 tablet (40 mg total) by mouth 2 (two) times daily.      lisinopril 10 MG tablet   Commonly known as: PRINIVIL,ZESTRIL   Take 1 tablet (10 mg total) by mouth daily.      metoprolol tartrate 25 MG tablet   Commonly known as: LOPRESSOR   Take 1 tablet (25 mg total) by mouth 2 (two) times daily.      omeprazole 40 MG capsule   Commonly known as: PRILOSEC   Take 1 capsule (40 mg total) by mouth 2 (two) times daily.      predniSONE 20 MG tablet   Commonly known as: DELTASONE   Take 4 tablets (80 mg total) by mouth daily with breakfast.      sodium bicarbonate 650 MG tablet   Take 1 tablet (650 mg total) by mouth 2 (two) times daily.           Follow-up Information    Follow up with follow up with primary care physician. Schedule an appointment as soon as possible for a visit in 1 week. (evaluate thyroid nodule with an ultrasound)       Follow up with Delfin Edis, MD on 11/11/2011. (1:30 PM)    Contact information:   [address] [facility] [place] [phone]       Follow up with Louis Meckel, MD. Schedule an appointment as soon as possible for a visit on 09/16/2011. (3:15 pm Hospital Follow Up as scheduled)    Contact information:   [place] [phone]       Follow up with Jerene Bears, MD. Schedule an appointment as soon as possible for a visit in 2 weeks. (Follow up biopsy results)    Contact information:   [address] [phone]       Follow up with Monday - Labs CBC and BMP at  [facility]. (Get labs drawn CBC and BMP)    Follow up with Dr.Robert Byrum  in 2 weeks to follow up Lungs      I spent 43 mins preparing discharge, reviewing labs, images, notes, reconciling meds, writing prescriptions, dictating summary   Signed:  [place], [place] 09/13/2011, 9:49 AM

## 2011-09-13 NOTE — Discharge Instructions (Signed)
AVOID ALL NSAID MEDICATIONS INCLUDING ASPIRIN, IBUPROFEN, GOODY POWDER, NAPROXEN, ETC PLEASE FOLLOW UP AS RECOMMENDED FOLLOW UP YOUR BIOPSY RESULTS WITH STOMACH SPECIALIST AS RECOMMENDED PLEASE FOLLOW UP WITH KIDNEY SPECIALIST AS RECOMMENDED ON Tuesday 3:15 PM.  GO GET LABS DONE ON Monday CBC AND BMP AT THE OFFICE PLEASE SEE YOUR PRIMARY CARE PHYSICIAN IN 1 WEEK  GO TO ER OR SEEK MEDICAL CARE IF SYMPTOMS RETURN, DON'T IMPROVE, WORSEN OR NEW PROBLEMS DEVELOP FOLLOW UP WITH STOMACH DOCTOR TO GET BIOPSY RESULTS AND HAVE REPEAT COLONSCOPY IN 6 MONTHS HAVE THYROID NODULE CHECKED OUT FURTHER WITH YOUR PRIMARY CARE PHYSICIAN  DO NOT USE ANY RECREATIONAL DRUGS   Colon Polyps A polyp is extra tissue that grows inside your body. Colon polyps grow in the large intestine. The large intestine, also called the colon, is part of your digestive system. It is a long, hollow tube at the end of your digestive tract where your body makes and stores stool. Most polyps are not dangerous. They are benign. This means they are not cancerous. But over time, some types of polyps can turn into cancer. Polyps that are smaller than a pea are usually not harmful. But larger polyps could someday become or may already be cancerous. To be safe, doctors remove all polyps and test them.  WHO GETS POLYPS? Anyone can get polyps, but certain people are more likely than others. You may have a greater chance of getting polyps if:  You are over 50.   You have had polyps before.   Someone in your family has had polyps.   Someone in your family has had cancer of the large intestine.   Find out if someone in your family has had polyps. You may also be more likely to get polyps if you:   Eat a lot of fatty foods.   Smoke.   Drink alcohol.   Do not exercise.   Eat too much.  SYMPTOMS  Most small polyps do not cause symptoms. People often do not know they have one until their caregiver finds it during a regular checkup or  while testing them for something else. Some people do have symptoms like these:  Bleeding from the anus. You might notice blood on your underwear or on toilet paper after you have had a bowel movement.   Constipation or diarrhea that lasts more than a week.   Blood in the stool. Blood can make stool look black or it can show up as red streaks in the stool.  If you have any of these symptoms, see your caregiver. HOW DOES THE DOCTOR TEST FOR POLYPS? The doctor can use four tests to check for polyps:  Digital rectal exam. The caregiver wears gloves and checks your rectum (the last part of the large intestine) to see if it feels normal. This test would find polyps only in the rectum. Your caregiver may need to do one of the other tests listed below to find polyps higher up in the intestine.   Barium enema. The caregiver puts a liquid called barium into your rectum before taking x-rays of your large intestine. Barium makes your intestine look white in the pictures. Polyps are dark, so they are easy to see.   Sigmoidoscopy. With this test, the caregiver can see inside your large intestine. A thin flexible tube is placed into your rectum. The device is called a sigmoidoscope, which has a light and a tiny video camera in it. The caregiver uses the sigmoidoscope to look at  the last third of your large intestine.   Colonoscopy. This test is like sigmoidoscopy, but the caregiver looks at all of the large intestine. It usually requires sedation. This is the most common method for finding and removing polyps.  TREATMENT   The caregiver will remove the polyp during sigmoidoscopy or colonoscopy. The polyp is then tested for cancer.   If you have had polyps, your caregiver may want you to get tested regularly in the future.  PREVENTION  There is not one sure way to prevent polyps. You might be able to lower your risk of getting them if you:  Eat more fruits and vegetables and less fatty food.   Do not  smoke.   Avoid alcohol.   Exercise every day.   Lose weight if you are overweight.   Eating more calcium and folate can also lower your risk of getting polyps. Some foods that are rich in calcium are milk, cheese, and broccoli. Some foods that are rich in folate are chickpeas, kidney beans, and spinach.   Aspirin might help prevent polyps. Studies are under way.  Document Released: 10/10/2003 Document Revised: 01/02/2011 Document Reviewed: 03/17/2007 [facility] Patient Information 2012 [place], [place].  Esophagitis Esophagitis is inflammation of the esophagus. It can involve swelling, soreness, and pain in the esophagus. This condition can make it difficult and painful to swallow. CAUSES  Most causes of esophagitis are not serious. Many different factors can cause esophagitis, including:  Gastroesophageal reflux disease (GERD). This is when acid from your stomach flows up into the esophagus.   Recurrent vomiting.   An allergic-type reaction.   Certain medicines, especially those that come in large pills.   Ingestion of harmful chemicals, such as household cleaning products.   Heavy alcohol use.   An infection of the esophagus.   Radiation treatment for cancer.   Certain diseases such as sarcoidosis, Crohn's disease, and scleroderma. These diseases may cause recurrent esophagitis.  SYMPTOMS   Trouble swallowing.   Painful swallowing.   Chest pain.   Difficulty breathing.   Nausea.   Vomiting.   Abdominal pain.  DIAGNOSIS  Your caregiver will take your history and do a physical exam. Depending upon what your caregiver finds, certain tests may also be done, including:  Barium X-ray. You will drink a solution that coats the esophagus, and X-rays will be taken.   Endoscopy. A lighted tube is put down the esophagus so your caregiver can examine the area.   Allergy tests. These can sometimes be arranged through follow-up visits.  TREATMENT  Treatment will depend on  the cause of your esophagitis. In some cases, steroids or other medicines may be given to help relieve your symptoms or to treat the underlying cause of your condition. Medicines that may be recommended include:  Viscous lidocaine, to soothe the esophagus.   Antacids.   Acid reducers.   Proton pump inhibitors.   Antiviral medicines for certain viral infections of the esophagus.   Antifungal medicines for certain fungal infections of the esophagus.   Antibiotic medicines, depending on the cause of the esophagitis.  HOME CARE INSTRUCTIONS   Avoid foods and drinks that seem to make your symptoms worse.   Eat small, frequent meals instead of large meals.   Avoid eating for the 3 hours prior to your bedtime.   If you have trouble taking pills, use a pill splitter to decrease the size and likelihood of the pill getting stuck or injuring the esophagus on the way down. Drinking  water after taking a pill also helps.   Stop smoking if you smoke.   Maintain a healthy weight.   Wear loose-fitting clothing. Do not wear anything tight around your waist that causes pressure on your stomach.   Raise the head of your bed 6 to 8 inches with wood blocks to help you sleep. Extra pillows will not help.   Only take over-the-counter or prescription medicines as directed by your caregiver.  SEEK IMMEDIATE MEDICAL CARE IF:  You have severe chest pain that radiates into your arm, neck, or jaw.   You feel sweaty, dizzy, or lightheaded.   You have shortness of breath.   You vomit blood.   You have difficulty or pain with swallowing.   You have bloody or black, tarry stools.   You have a fever.   You have a burning sensation in the chest more than 3 times a week for more than 2 weeks.   You cannot swallow, drink, or eat.   You drool because you cannot swallow your saliva.  MAKE SURE YOU:  Understand these instructions.   Will watch your condition.   Will get help right away if you are  not doing well or get worse.  Document Released: 02/21/2004 Document Revised: 01/02/2011 Document Reviewed: 09/13/2010 [facility] Patient Information 2012 [place], [place].  Gastritis Gastritis is an irritation of the stomach. It can be caused by anything that bothers the stomach. Some irritants include:  Alcohol.   Caffeine.   Nicotine.   Spicy, acidic, greasy, and fried foods.   Medicines for pain and arthritis.   Emotional distress.  HOME CARE   Only take medicine as told by your doctor.   Take small sips of clear liquids often. Do not drink large amounts of liquids at one time.   If you have watery poop (diarrhea), avoid milk, fruit, tobacco, alcohol, really hot or cold liquids, or too much of anything at one time.   Rest.   Wash your hands often.   Once you can keep clear liquids down, start soups, juices, apple sauce, crackers, and sherbet. Slowly add plain, not spicy, foods to your diet.  GET HELP RIGHT AWAY IF:   You cannot keep fluids down.   You cannot stop throwing up (vomiting) or you throw up blood.   You have more stomach or chest pain.   You have a temperature by mouth above 102 F (38.9 C), not controlled by medicine.   You pass out (faint), feel lightheaded, or are more thirsty than normal.   You have bloody or black poop (stools).   Your watery poop will not go away.   You are not improving or are getting worse.  MAKE SURE YOU:   Understand these instructions.   Will watch your condition.   Will get help right away if you are not doing well or get worse.  Document Released: 07/02/2007 Document Revised: 01/02/2011 Document Reviewed: 12/01/2008 [facility] Patient Information 2012 [place], [place].   Glomerulonephritis Your kidneys are 2 organs located at the small of your back, just below your rib cage. Each is about the size of your fist. Glomerulonephritis is a form of kidney disease that can rapidly or slowly damage the small working units  (glomeruli) inside the kidneys. This can lead to a dangerous buildup of waste products in your bloodstream. CAUSES  The cause may be unknown. If known the cause may be:  A strep infection. Strep infections should be treated rapidly.   Germ infections (viral  and bacterial).   Immune problems.   Inflammation of the blood vessels from other causes.  Glomerulonephritis can be a disease by itself. This is called primary glomerulonephritis. It can also be part of another disease, such as lupus. You can have a different form of glomerular disease from other causes such as diabetes or high blood pressure.  SYMPTOMS  Early in this disease, red blood cells and protein show up in your urine. There may also be anemia. Later, other problems may show up. Signs and symptoms may include:  Brownish colored urine.   Foam in the toilet water when you are urinating.   High blood pressure.   Fluid retention. This shows up as swelling in your face, hands, feet, and ankles.   Feeling tired or fatigued.   Less frequent urination than usual.  DIAGNOSIS  Your caregiver may give you blood and urine tests, specialized X-rays, and sometimes a biopsy. TREATMENT  Treatment and outcome depends on the type, cause, and stage of the disease.   Medications may be used to treat high blood pressure and increase urine production when it has been diminished.   Other medications may be used to treat the cause once known.   If kidney failure is present, dialysis may be used temporarily. Dialysis is the artificial kidney used to filter and clean the blood. If the kidneys fail completely and do not return to normal, a kidney transplant may be necessary.  HOME CARE INSTRUCTIONS   Your caregiver may recommend changes in your diet. This may include reducing salt intake. This will cut down on swelling and high blood pressure.   Reducing protein and potassium in your diet may slow the buildup of wastes in your blood.    Diabetics should maintain a healthy weight, control blood sugar levels, and blood pressure. This may help slow kidney damage.  RELATED COMPLICATIONS   Sudden or long term kidney failure.   High blood pressure.   Nephrotic syndrome which is a condition of loss of protein in your urine. This causes:   Low protein levels in the blood.   High cholesterol.   Swelling of the eyelids, feet, and abdomen.   Heart failure.   Fluid in your lungs.   Swelling of your face, ankles, and belly.  SEEK MEDICAL CARE IF:   You have a new onset of blood in your urine, or the amount of blood in your urine increases.   You develop shortness of breath or chest pain.   You cough up any frothy or bloody sputum.  MAKE SURE YOU:   Understand these instructions.   Will watch your condition.   Will get help right away if you are not doing well or get worse.  Document Released: 11/24/2005 Document Revised: 01/02/2011 Document Reviewed: 01/08/2010 [facility] Patient Information 2012 [place], [place].  Kidney Failure Kidney failure happens when the kidneys cannot remove waste and excess fluid that naturally builds up in your blood after your body breaks down food. This leads to a dangerous buildup of waste products and fluid in the blood. HOME CARE  Follow your diet as told by your doctor.   Take all medicines as told by your doctor.   Keep all of your dialysis appointments. Call if you are unable to keep an appointment.  GET HELP RIGHT AWAY IF:   You make a lot more or very little pee (urine).   Your face or ankles puff up (swell).   You develop shortness of breath.  You develop weakness, feel tired, or you do not feel hungry (appetite loss).   You feel poorly for no known reason.  MAKE SURE YOU:   Understand these instructions.   Will watch your condition.   Will get help right away if you are not doing well or get worse.  Document Released: 04/09/2009 Document Revised: 01/02/2011  Document Reviewed: 05/16/2009 [facility] Patient Information 2012 [place], [place].  Cocaine Abuse and Chemical Dependency WHEN IS DRUG USE A PROBLEM? Anytime drug use is interfering with normal living activities it has become abuse. This includes problems with family and friends. Psychological dependence has developed when your mind tells you that the drug is needed. This is usually followed by physical dependence which has developed when continuing increases of drug are required to get the same feeling or "high". This is known as addiction or chemical dependency. A person's risk is much higher if there is a history of chemical dependency in the family. SIGNS OF CHEMICAL DEPENDENCY:  Been told by friends or family that drugs have become a problem.   Fighting when using drugs.   Having blackouts (not remembering what you do while using).   Feel sick from using drugs but continue using.   Lie about use or amounts of drugs (chemicals) used.   Need chemicals to get you going.   Suffer in work International aid/development worker or school because of drug use.   Get sick from use of drugs but continue to use anyway.   Need drugs to relate to people or feel comfortable in social situations.   Use drugs to forget problems.  Yes answered to any of the above signs of chemical dependency indicates there are problems. The longer the use of drugs continues, the greater the problems will become. If there is a family history of drug or alcohol use it is best not to experiment with these drugs. Experimentation leads to tolerance and needing to use more of the drug to get the same feeling. This is followed by addiction where drugs become the most important part of life. It becomes more important to take drugs than participate in the other usual activities of life including relating to friends and family. Addiction is followed by dependency where drugs are now needed not just to get high but to feel normal. Addiction cannot be  cured but it can be stopped. This often requires outside help and the care of professionals. Treatment centers are listed in the yellow pages under: Cocaine, Narcotics, and Alcoholics Anonymous. Most hospitals and clinics can refer you to a specialized care center. WHAT IS COCAINE? Cocaine is a strong nervous system stimulant which speeds up the body and gives the user the feeling that they have increased energy, loss of appetite and feelings of great pleasure. This "high" which begins within several minutes and lasts for less than an hour is followed by a "crash". The crash and depressed feelings that come with it cause a craving for the drug to regain the high. HOW IS COCANINE USED? Cocaine is snorted, injected, and smoked as free- base or crack. Because smoking the drug produces a greater high it is also associated with a greater low. It is therefore more rapidly addicting. WHAT ARE THE EFFECTS OF COCAINE? It is an anesthetic (pain killer) and a stimulant (it causes a high which gives a false feeling of well being). It increases heart and breathing rates with increases in body temperature and blood pressure. It removes appetite. It causes seizures (convulsions) along with  nausea (feeling sick to your stomach), vomiting and stomach pain. This dangerous combination can lead to death. Trying to keep the high feeling leads to greater and greater drug use and this leads to addiction. Addiction can only be helped by stopping use of all chemicals. This is hard but may save your life. If the addiction is continued, the only possible outcome is loss of self respect and self esteem, violence, death, and eventually prison if the addict is fortunate enough to be caught and able to receive help prior to this last life ending event. OTHER HEALTH RISKS OF COCAINE AND ALL DRUG USE ARE:  The increased possibility of getting AIDS or hepatitis (liver inflammation).   Having a baby born which is addicted to cocaine and  must go through painful withdrawal including shaking, jerking, and crying in pain. Many of the babies die. Other babies go through life with lifelong disabilities and learning problems.  HOW TO STAY DRUG FREE ONCE YOU HAVE QUIT USING:  Develop healthy activities and form friends who do not use drugs.   Stay away from the drug scene.   Tell the pusher or former friend you have other better things to do.   Have ready excuses available about why you cannot use.  For more help or information contact your local physician, clinic, hospital or dial 1-800-cocaine (709)732-8330). Document Released: 01/11/2000 Document Revised: 01/02/2011 Document Reviewed: 09/01/2007 [facility] Patient Information 2012 [place], [place].  Renal Diet Chronic kidney disease (CKD) is when the kidneys are not working the way they should. There are 5 stages of kidney disease. Your meal plan will depend on the stage you are diagnosed with. Food can make you healthier and keep your kidneys working well. Use this sheet to help you learn how to eat right to feel right.  A Registered Dietitian can help you with this meal plan. Write down your dietician's name and phone number. HOW DOES FOOD AFFECT MY KIDNEYS? Food gives you energy and helps your body repair itself. Food is broken down in your stomach and intestines. Your blood picks up nutrients from the digested food and carries them to all your body cells. These cells take nutrients from your blood and put waste products back into the bloodstream. When your kidneys were healthy, they constantly removed wastes from your blood. The wastes left your body when you urinated or when you had bowel movements. Now that your kidneys cannot remove wastes from food, you will need to eat fewer foods that cause waste buildup in the body.  FLUIDS Most people with CKD do not need to restrict fluid. However, if you are retaining fluid in your legs, your caregiver may recommend restricting  fluids. Ask and record the amount of fluid you can have each day if instructed by your caregiver. POTASSIUM  Potassium is a mineral found in many foods, especially milk, fruits, and vegetables. It affects how steadily your heart beats. Healthy kidneys keep the right amount of potassium in the blood to keep the heart beating at a steady pace. You may need to restrict potassium. Talk to your caregiver or dietitian to learn more about your potassium needs. If you do need to reduce the potassium in your diet, start by noting the high-potassium foods (below) that you now eat. A dietitian can help you add other foods to the list and tell you how much of the foods below to eat.  High-Potassium Foods  Apricots.   Brussels sprouts.   Dates.   Lima beans.  Oranges.   Prune juice.   Spinach.   Avocados.   Milk.   Figs.   Melons.   Peanuts.   Prunes.   Tomatoes.   Bananas.   Cantaloupe.   Kiwi fruit.   Nectarines.   Asparagus spears.   Raisins.   Winter squash.   Beets.   Clams.   Fruit.   Orange juice.   Potatoes.   Sardines.   Yogurt.  PHOSPHORUS  Phosphorus is a mineral found in many foods. If you have too much phosphorus in your blood, it pulls calcium from your bones. Losing calcium will make your bones weak and likely to break. Also, too much phosphorus may make your skin itch. Avoid foods like milk and cheese, dried beans, peas, colas, nuts, and peanut butter, which are high in phosphorus.   Your needs will depend on your kidney's ability to use phosphorous. Your dietitian can tell you how much of these foods to eat.  PROTEIN  It is important to follow a low-protein diet. A lower protein diet will slow the rate of your kidney failure.   Protein helps you keep muscle and repair tissue. In your body, the protein you eat breaks down into a waste product called urea. If urea builds up in your blood, you can become very sick.  Ask and record how many  servings of meat, fish, chicken, or eggs you may eat per day. Your dietitian may give you a specific number of grams of protein to eat daily.One ounce of meat, fish, chicken, or 1 egg is equal to 7 g. A regular serving size is about the size of the palm of your hand or a deck of cards and is 3 oz in weight and 21 g of protein.  SODIUM  Sodium is found in salt and other foods. Most canned foods and frozen dinners contain large amounts of sodium.   Try to eat fresh foods that are naturally low in sodium. Look for products labeled "low sodium."   Do not use salt substitutes because they contain potassium. Talk to a dietitian about spices you can use to flavor your food. The dietitian can help you find spice blends without sodium or potassium.  VITAMINS AND MINERALS   Take only the vitamins that your caregiver prescribes.   Vitamins and minerals may be missing from your diet because you need to avoid so many foods. Your caregiver may prescribe a vitamin and mineral supplement to meet your needs.  Document Released: 04/05/2002 Document Revised: 01/02/2011 Document Reviewed: 04/12/2010 [facility] Patient Information 2012 [place], [place].

## 2011-09-13 NOTE — Progress Notes (Signed)
AVS reviewed with pt, including educational materials at end of AVS packet. Teach back method used. Pt able to verbalize understanding of AVS and questions were answered. RX's given. Pt remains stable. IV and tele removed. Ambulatory pt escorted to exit of facility. [place], Rosanna Randy

## 2011-09-18 ENCOUNTER — Encounter: Payer: Self-pay | Admitting: Internal Medicine

## 2011-09-22 ENCOUNTER — Telehealth: Payer: Self-pay | Admitting: Internal Medicine

## 2011-09-22 NOTE — Telephone Encounter (Signed)
Received 7 pages from [employer]. Sent to Dr. Rhea Belton. 09/22/11/SD

## 2011-09-25 ENCOUNTER — Telehealth: Payer: Self-pay | Admitting: *Deleted

## 2011-09-25 NOTE — Telephone Encounter (Signed)
Spoke with pt to inform him to disregard the path letter Dr Rhea Belton sent. Per his COLON repot on 09/11/11, he is to repeat the procedure in 6 months d/t a poor prep and numerous polyps. He is also Dr Regino Schultze pt and will f/u with her in October, 2013 and she will handle the recall.

## 2011-10-01 ENCOUNTER — Inpatient Hospital Stay (HOSPITAL_COMMUNITY)
Admission: EM | Admit: 2011-10-01 | Discharge: 2011-10-04 | DRG: 392 | Disposition: A | Discharge status: Home / Self Care | Payer: MEDICAID | Attending: Internal Medicine | Admitting: Internal Medicine

## 2011-10-01 ENCOUNTER — Encounter (HOSPITAL_COMMUNITY): Payer: Self-pay | Admitting: Emergency Medicine

## 2011-10-01 DIAGNOSIS — K922 Gastrointestinal hemorrhage, unspecified: Secondary | ICD-10-CM

## 2011-10-01 DIAGNOSIS — Z87442 Personal history of urinary calculi: Secondary | ICD-10-CM

## 2011-10-01 DIAGNOSIS — N493 Fournier gangrene: Secondary | ICD-10-CM

## 2011-10-01 DIAGNOSIS — D62 Acute posthemorrhagic anemia: Secondary | ICD-10-CM

## 2011-10-01 DIAGNOSIS — D7589 Other specified diseases of blood and blood-forming organs: Secondary | ICD-10-CM

## 2011-10-01 DIAGNOSIS — K635 Polyp of colon: Secondary | ICD-10-CM

## 2011-10-01 DIAGNOSIS — D61818 Other pancytopenia: Secondary | ICD-10-CM

## 2011-10-01 DIAGNOSIS — R197 Diarrhea, unspecified: Principal | ICD-10-CM | POA: Diagnosis present

## 2011-10-01 DIAGNOSIS — N179 Acute kidney failure, unspecified: Secondary | ICD-10-CM

## 2011-10-01 DIAGNOSIS — N058 Unspecified nephritic syndrome with other morphologic changes: Secondary | ICD-10-CM | POA: Diagnosis present

## 2011-10-01 DIAGNOSIS — Z79899 Other long term (current) drug therapy: Secondary | ICD-10-CM

## 2011-10-01 DIAGNOSIS — Z860100 Personal history of colon polyps, unspecified: Secondary | ICD-10-CM

## 2011-10-01 DIAGNOSIS — L98499 Non-pressure chronic ulcer of skin of other sites with unspecified severity: Secondary | ICD-10-CM | POA: Diagnosis present

## 2011-10-01 DIAGNOSIS — E860 Dehydration: Secondary | ICD-10-CM

## 2011-10-01 DIAGNOSIS — M3131 Wegener's granulomatosis with renal involvement: Secondary | ICD-10-CM | POA: Diagnosis present

## 2011-10-01 DIAGNOSIS — M3130 Wegener's granulomatosis without renal involvement: Secondary | ICD-10-CM | POA: Diagnosis present

## 2011-10-01 DIAGNOSIS — E871 Hypo-osmolality and hyponatremia: Secondary | ICD-10-CM | POA: Diagnosis present

## 2011-10-01 DIAGNOSIS — R21 Rash and other nonspecific skin eruption: Secondary | ICD-10-CM | POA: Diagnosis present

## 2011-10-01 DIAGNOSIS — D649 Anemia, unspecified: Secondary | ICD-10-CM | POA: Diagnosis present

## 2011-10-01 DIAGNOSIS — T451X5A Adverse effect of antineoplastic and immunosuppressive drugs, initial encounter: Secondary | ICD-10-CM | POA: Diagnosis present

## 2011-10-01 DIAGNOSIS — Y92009 Unspecified place in unspecified non-institutional (private) residence as the place of occurrence of the external cause: Secondary | ICD-10-CM

## 2011-10-01 DIAGNOSIS — K5730 Diverticulosis of large intestine without perforation or abscess without bleeding: Secondary | ICD-10-CM

## 2011-10-01 DIAGNOSIS — K5790 Diverticulosis of intestine, part unspecified, without perforation or abscess without bleeding: Secondary | ICD-10-CM

## 2011-10-01 DIAGNOSIS — D696 Thrombocytopenia, unspecified: Secondary | ICD-10-CM | POA: Diagnosis not present

## 2011-10-01 DIAGNOSIS — F1410 Cocaine abuse, uncomplicated: Secondary | ICD-10-CM | POA: Diagnosis present

## 2011-10-01 DIAGNOSIS — Z885 Allergy status to narcotic agent status: Secondary | ICD-10-CM

## 2011-10-01 DIAGNOSIS — D126 Benign neoplasm of colon, unspecified: Secondary | ICD-10-CM

## 2011-10-01 DIAGNOSIS — F1490 Cocaine use, unspecified, uncomplicated: Secondary | ICD-10-CM | POA: Diagnosis present

## 2011-10-01 HISTORY — DX: Gastrointestinal hemorrhage, unspecified: K92.2

## 2011-10-01 HISTORY — DX: Anemia, unspecified: D64.9

## 2011-10-01 HISTORY — DX: Wegener's granulomatosis with renal involvement: M31.31

## 2011-10-01 HISTORY — DX: Personal history of other medical treatment: Z92.89

## 2011-10-01 LAB — CBC WITH DIFFERENTIAL/PLATELET
Eosinophils Absolute: 0 10*3/uL (ref 0.0–0.7)
Lymphocytes Relative: 15 % (ref 12–46)
Lymphs Abs: 0.4 10*3/uL — ABNORMAL LOW (ref 0.7–4.0)
Monocytes Relative: 10 % (ref 3–12)
Neutro Abs: 1.8 10*3/uL (ref 1.7–7.7)
Neutrophils Relative %: 74 % (ref 43–77)

## 2011-10-01 LAB — URINALYSIS, ROUTINE W REFLEX MICROSCOPIC
Glucose, UA: NEGATIVE mg/dL
Ketones, ur: NEGATIVE mg/dL
Protein, ur: 30 mg/dL — AB
pH: 5 (ref 5.0–8.0)

## 2011-10-01 LAB — RAPID URINE DRUG SCREEN, HOSP PERFORMED
Amphetamines: NOT DETECTED
Barbiturates: NOT DETECTED
Benzodiazepines: NOT DETECTED
Cocaine: NOT DETECTED
Opiates: NOT DETECTED
Tetrahydrocannabinol: NOT DETECTED

## 2011-10-01 LAB — URINE MICROSCOPIC-ADD ON

## 2011-10-01 LAB — COMPREHENSIVE METABOLIC PANEL
AST: 11 U/L (ref 0–37)
Albumin: 2.8 g/dL — ABNORMAL LOW (ref 3.5–5.2)
Alkaline Phosphatase: 56 U/L (ref 39–117)
Chloride: 94 mEq/L — ABNORMAL LOW (ref 96–112)
Potassium: 4.7 mEq/L (ref 3.5–5.1)
Total Bilirubin: 0.6 mg/dL (ref 0.3–1.2)

## 2011-10-01 LAB — CBC
Platelets: 110 10*3/uL — ABNORMAL LOW (ref 150–400)
RDW: 19.2 % — ABNORMAL HIGH (ref 11.5–15.5)
WBC: 2.4 10*3/uL — ABNORMAL LOW (ref 4.0–10.5)

## 2011-10-01 LAB — CLOSTRIDIUM DIFFICILE BY PCR: Toxigenic C. Difficile by PCR: NEGATIVE

## 2011-10-01 MED ORDER — METOPROLOL TARTRATE 25 MG PO TABS
25.0000 mg | ORAL_TABLET | Freq: Two times a day (BID) | ORAL | Status: DC
  Administered 2011-10-02 – 2011-10-04 (×5): 25 mg via ORAL
  Filled 2011-10-01 (×8): qty 1

## 2011-10-01 MED ORDER — SODIUM CHLORIDE 0.9 % IV BOLUS (SEPSIS): 1000.0000 mL | Freq: Once | INTRAVENOUS | Status: DC

## 2011-10-01 MED ORDER — ONDANSETRON HCL 4 MG/2ML IJ SOLN: 4.0000 mg | Freq: Four times a day (QID) | INTRAMUSCULAR | Status: DC | PRN

## 2011-10-01 MED ORDER — SODIUM CHLORIDE 0.9 % IJ SOLN: 3.0000 mL | Freq: Two times a day (BID) | INTRAMUSCULAR | Status: DC

## 2011-10-01 MED ORDER — ONDANSETRON HCL 4 MG PO TABS: 4.0000 mg | ORAL_TABLET | Freq: Four times a day (QID) | ORAL | Status: DC | PRN

## 2011-10-01 MED ORDER — CYCLOPHOSPHAMIDE 50 MG PO TABS
150.0000 mg | ORAL_TABLET | Freq: Every day | ORAL | Status: DC
  Filled 2011-10-01 (×3): qty 3

## 2011-10-01 MED ORDER — SODIUM CHLORIDE 0.9 % IV SOLN
INTRAVENOUS | Status: DC
  Administered 2011-10-02 (×2): via INTRAVENOUS
  Administered 2011-10-02: 1000 mL via INTRAVENOUS
  Administered 2011-10-03: 19:00:00 via INTRAVENOUS
  Administered 2011-10-03: 1000 mL via INTRAVENOUS

## 2011-10-01 MED ORDER — PREDNISONE 20 MG PO TABS
20.0000 mg | ORAL_TABLET | Freq: Every day | ORAL | Status: DC
  Administered 2011-10-02 – 2011-10-04 (×3): 20 mg via ORAL
  Filled 2011-10-01 (×4): qty 1

## 2011-10-01 MED ORDER — SODIUM CHLORIDE 0.9 % IV BOLUS (SEPSIS)
1000.0000 mL | Freq: Once | INTRAVENOUS | Status: AC
  Administered 2011-10-01: 1000 mL via INTRAVENOUS

## 2011-10-01 MED ORDER — SODIUM CHLORIDE 0.9 % IV SOLN
INTRAVENOUS | Status: AC
  Administered 2011-10-01: 19:00:00 via INTRAVENOUS

## 2011-10-01 NOTE — ED Notes (Signed)
Pt c/o recent hospitalization and now having watery diarrhea x 9 days; pt sts some abd cramping and feels dehydrated; pt recently started on new meds while in the hospital
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
## 2011-10-01 NOTE — Progress Notes (Signed)
Notified by 4N telemetry that pt's heart rate sustaining in 160's.  Went to room and found that patient had gotten up to bathroom to have bowel movement. Pt in bathroom currently, heart rate in 140's. Continue to monitor.

## 2011-10-01 NOTE — Progress Notes (Signed)
Disposition Note  Alexander Grant, is a 56 y.o. male,   MRN: [id number]  -  DOB - [birth year]  Outpatient Primary MD for the patient is Standley Dakins, MD   Blood pressure 148/88, pulse 88, temperature 98.5 F (36.9 C), temperature source Oral, resp. rate 20, SpO2 99.00%.  Active Problems:  Wegener's granulomatosis with renal involvement  Diarrhea  Hyponatremia  Cocaine use   56 yo male discharged 09/13/11 with a new diagnosis of Wegener's presents to the ED with 9 days of watery diarrhea (9-10) stools per day with streaks of blood.  Further he has a new rash on his extremities and palms.  The EDP reports to me that he has started a new medication, Cytoxan for the Wegener's.  In the ED he is found to be hyponatremic at 128.  Hgb stable (9.0), creatinine stable (5.35). His nephrologist is Dr. Marjory Sneddon.  I have requested a telemetry bed.  Stool has been sent for c-diff PCR.   Algis Downs, PA-C [facility] Pager: [phone]

## 2011-10-01 NOTE — Progress Notes (Signed)
Patient admitted to [facility] room 26 from the E.D. DX: diarrhea. Cdiff PCR negative. Reported watery stool with blood. Instructed to notify RN of any additional stool. IVF infusing by gravity on admission. Denies any pain at present. Oriented to room. Call bell in reach
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
## 2011-10-02 ENCOUNTER — Inpatient Hospital Stay (HOSPITAL_COMMUNITY): Payer: MEDICAID

## 2011-10-02 DIAGNOSIS — E871 Hypo-osmolality and hyponatremia: Secondary | ICD-10-CM

## 2011-10-02 DIAGNOSIS — R21 Rash and other nonspecific skin eruption: Secondary | ICD-10-CM | POA: Diagnosis present

## 2011-10-02 LAB — PROTIME-INR: INR: 1.15 (ref 0.00–1.49)

## 2011-10-02 LAB — COMPREHENSIVE METABOLIC PANEL
Albumin: 2.2 g/dL — ABNORMAL LOW (ref 3.5–5.2)
Alkaline Phosphatase: 99 U/L (ref 39–117)
BUN: 120 mg/dL — ABNORMAL HIGH (ref 6–23)
Creatinine, Ser: 4.95 mg/dL — ABNORMAL HIGH (ref 0.50–1.35)
GFR calc Af Amer: 14 mL/min — ABNORMAL LOW (ref >90)
Glucose, Bld: 83 mg/dL (ref 70–99)
Total Bilirubin: 0.7 mg/dL (ref 0.3–1.2)
Total Protein: 4.9 g/dL — ABNORMAL LOW (ref 6.0–8.3)

## 2011-10-02 LAB — CBC
HCT: 23.3 % — ABNORMAL LOW (ref 39.0–52.0)
MCV: 88.3 fL (ref 78.0–100.0)
Platelets: 105 10*3/uL — ABNORMAL LOW (ref 150–400)
RBC: 2.64 MIL/uL — ABNORMAL LOW (ref 4.22–5.81)
RDW: 19.7 % — ABNORMAL HIGH (ref 11.5–15.5)
WBC: 4.2 10*3/uL (ref 4.0–10.5)

## 2011-10-02 LAB — OVA AND PARASITE EXAMINATION

## 2011-10-02 MED ORDER — LOPERAMIDE HCL 2 MG PO CAPS
2.0000 mg | ORAL_CAPSULE | ORAL | Status: DC | PRN
  Administered 2011-10-02 – 2011-10-03 (×4): 2 mg via ORAL
  Filled 2011-10-02 (×3): qty 1

## 2011-10-02 NOTE — Progress Notes (Signed)
[facility] PROGRESS NOTE  WELCOME [name]:[id number] DOB: [birth year] DOA: 10/01/2011 PCP: Alexander Peer., MD  Assessment/Plan: Active Problems:  Cocaine use  Wegener's granulomatosis with renal involvement  Diarrhea  Hyponatremia  1. Diarrhea: c diff pcr negative, no ova or parasites. Stool cultures are pending. Cytoxan on hold to see if diarrhea improves. CT abdomen and pelvis ordered to evaluate for abdominal pain. Continue with IV fluids and anti emetics as needed.  2.  Wegeners Granulomatosis with renal involvement: cytoxan on hold to see if diarrhea improves. Currently on prednisone. Creatinine improving. Spoke to Dr Alexander Grant about holding cytoxan for a few days, she agreed and we wll continue to monitor his renal function.  3. Hyponatremia: improving with IV fluids.  4. Pink erythematous rash: possibly from thrombocytopenia. Will continue to monitor platelet count.  5. Anemia: baseline hgb is around 9, his hemoglobin is 8.1 today, will transfuse if hgb drops to less then 7.  6. DVT prophylaxis  Code Status: full code Family Communication: none at bedside Disposition Plan: pening, possibly home when medically stable   Brief narrative: Alexander Grant is a 56 y.o. male with h/o recently diagnosed Wegeners granulomatosis, discharged from the hospital on 8/17 with cytoxan, came in complaining of persistent diarrhea since 9 days , watery associated with crampy abdominal pain. He denies fever, but reports chills. No h/o vomiting, or nausea. Patient denies chest pain or sob. His stool was sent for Cd iff PCR , came back negative. He is admitted to hospitalist service for evaluation of diarrhea    Consultants:  renal  Procedures:  CT abd and pelvis  Antibiotics:  none  HPI/Subjective: Persistent diarrhea.   Objective: Filed Vitals:   10/02/11 0234 10/02/11 0617 10/02/11 1005 10/02/11 1200  BP: 100/60 111/54 113/61 112/70  Pulse: 70 82 80 88  Temp: 97.9 F  (36.6 C) 98.5 F (36.9 C) 98.3 F (36.8 C) 98.2 F (36.8 C)  TempSrc:   Oral Oral  Resp: 18 18 18 18   SpO2: 93% 95% 100% 100%    Intake/Output Summary (Last 24 hours) at 10/02/11 1253 Last data filed at 10/02/11 0841  Gross per 24 hour  Intake    992 ml  Output    956 ml  Net     36 ml   There were no vitals filed for this visit.  Exam:   General:  Alert afebrile comfortable  Cardiovascular: s1s2   Respiratory: CTAB  Abdomen: soft NT ND BS+  Extremities: no pedal edem cyanosis or clubbing.   Data Reviewed: Basic Metabolic Panel:  Lab 10/02/11 8119 10/01/11 0803  NA 132* 128*  K 4.9 4.7  CL 102 94*  CO2 17* 20  GLUCOSE 83 96  BUN 120* 137*  CREATININE 4.95* 5.35*  CALCIUM 8.4 9.6  MG -- --  PHOS -- --   Liver Function Tests:  Lab 10/02/11 0726 10/01/11 0803  AST 10 11  ALT 12 13  ALKPHOS 99 56  BILITOT 0.7 0.6  PROT 4.9* 5.8*  ALBUMIN 2.2* 2.8*   No results found for this basename: LIPASE:5,AMYLASE:5 in the last 168 hours No results found for this basename: AMMONIA:5 in the last 168 hours CBC:  Lab 10/02/11 0726 10/01/11 0803  WBC 4.2 2.4*THIS TEST WAS ORDERED IN ERROR AND HAS BEEN CREDITED. JUST WANT DIFF PER KERRI,ED [id number]  NEUTROABS -- 1.8  HGB 8.1* 9.0*THIS TEST WAS ORDERED IN ERROR AND HAS BEEN CREDITED. JUST WANT DIFF PER KERRI,ED [id number] [phone]  HCT 23.3* 25.6*THIS TEST WAS ORDERED IN ERROR AND HAS BEEN CREDITED. JUST WANT DIFF PER KERRI,ED [phone]  MCV 88.3 86.5THIS TEST WAS ORDERED IN ERROR AND HAS BEEN CREDITED. JUST WANT DIFF PER KERRI,ED [phone]  PLT 105* 110*THIS TEST WAS ORDERED IN ERROR AND HAS BEEN CREDITED. JUST WANT DIFF PER KERRI,ED [id number] [phone]   Cardiac Enzymes: No results found for this basename: CKTOTAL:5,CKMB:5,CKMBINDEX:5,TROPONINI:5 in the last 168 hours BNP (last 3 results)  Basename 09/01/11 1422  PROBNP 13030.0*   CBG: No results found for this basename: GLUCAP:5 in the last 168 hours  Recent  Results (from the past 240 hour(s))  CLOSTRIDIUM DIFFICILE BY PCR     Status: Normal   Collection Time   10/01/11 10:33 AM      Component Value Range Status Comment   C difficile by pcr NEGATIVE  NEGATIVE Final      Studies: Dg Chest 2 View  09/08/2011  *RADIOLOGY REPORT*  Clinical Data: Follow up of multi focal airspace opacities  CHEST - 2 VIEW  Comparison: Chest x-ray of 09/03/2011  Findings: The probable edema pattern has largely cleared, and effusions have resolved.  There are still somewhat prominent interstitial markings present particularly in the upper lobes. Mediastinal contours are stable.  The heart is enlarged and stable. No bony abnormality is seen.  IMPRESSION: Resolution of edema pattern with effusions.  Probable chronic interstitial change remains.  Recommend continued follow-up.  Original Report Authenticated By: Alexander Grant, M.D.   Dg Chest 2 View  09/03/2011  *RADIOLOGY REPORT*  Clinical Data: Cough, congestion  CHEST - 2 VIEW  Comparison: CT chest dated 09/02/2011  Findings: Multifocal airspace opacities bilaterally, with relative sparing in the bases, grossly unchanged. Small bilateral pleural effusions are better visualized on prior CT.  Mild cardiomegaly.  The appearance is most suggestive of moderate interstitial edema, less likely multifocal pneumonia.  Mild degenerative changes of the visualized thoracolumbar spine.  IMPRESSION: Stable multifocal airspace opacities bilaterally, most suggestive of moderate interstitial edema, less likely multifocal pneumonia.  Original Report Authenticated By: Alexander Grant, M.D.   US Biopsy  09/05/2011  *RADIOLOGY REPORT*  Indication: Acute renal insufficiency of undetermined etiology.  ULTRASOUND GUIDED RENAL BIOPSY  Comparison: Renal ultrasound - 09/01/2011  Medications: Fentanyl 100 mcg IV; Versed 2 mg IV  Total Moderate Sedation time: 15 minutes  Complications: None immediate  Procedure:  Informed written consent was obtained from the  patient after a discussion of the risks, benefits and alternatives to treatment. The patient understands and consents to the procedure.  A timeout was performed prior to the initiation of the procedure.  Ultrasound scanning was performed of the bilateral flanks.  The inferior pole of the right kidney was selected for biopsy due to location and sonographic window.  The procedure was planned.  The operative site was prepped and draped in the usual sterile fashion. The overlying soft tissues were anesthetized with 1% lidocaine with epinephrine.  A 17 gauge core needle biopsy device was advanced into the inferior cortex of the right kidney and 2 core biopsies were obtained under direct ultrasound guidance.  Real time pathologic review confirmed adequate tissue acquisition.  Images were saved for documentation purposes.  The biopsy device was removed and hemostasis was obtained with manual compression.  Post procedural scanning was negative for significant post procedural hemorrhage or additional complication.  A dressing was placed.  The patient tolerated the procedure well without immediate post procedural complication.  Impression:  Technically successful ultrasound  guided right renal biopsy.  Original Report Authenticated By: Waynard Reeds, M.D.    Scheduled Meds:   . sodium chloride   Intravenous STAT  . metoprolol tartrate  25 mg Oral BID  . predniSONE  20 mg Oral Daily  . sodium chloride  1,000 mL Intravenous Once  . sodium chloride  3 mL Intravenous Q12H  . DISCONTD: cyclophosphamide  150 mg Oral QAC breakfast   Continuous Infusions:   . sodium chloride 1,000 mL (10/02/11 0827)    Active Problems:  Cocaine use  Wegener's granulomatosis with renal involvement  Diarrhea  Hyponatremia        Barry Culverhouse  [facility] Pager [phone]. If 8PM-8AM, please contact night-coverage at [email or web address], password [facility] 10/02/2011, 12:53 PM  LOS: 1 day

## 2011-10-02 NOTE — Progress Notes (Signed)
Nutrition Brief Note  Patient identified on the Malnutrition Screening Tool (MST) report for wt loss, generating a score of 4.   Current diet order is clear liquids, patient is consuming approximately 100% of meals at this time. Labs and medications reviewed.   Pt reports 10 lbs of wt loss r/t fluid loss and dehydration from severe diarrhea.  Pt re-weighed via bedscale at 200.8 lbs which is his usual wt.  Pt states that he has been following a renal diet for approximately one month.  He reports keeping close record of intake and spending a great deal of time in the grocery store selecting foods appropriate for his diet therapy.  Pt denies concerns related to renal diet at this time, no questions at this time.  Pt reports he strictly follows recommendations for renal diet and ensures nutrition goals are met.  No nutrition interventions warranted at this time. If nutrition issues arise, please consult RD. Will monitor for diet advancement.  Loyce Dys, MS RD LDN Clinical Inpatient Dietitian Pager: [phone] Weekend/After hours pager: [phone]

## 2011-10-03 MED ORDER — BIOTENE DRY MOUTH MT LIQD
15.0000 mL | Freq: Two times a day (BID) | OROMUCOSAL | Status: DC
  Administered 2011-10-03 – 2011-10-04 (×2): 15 mL via OROMUCOSAL

## 2011-10-03 MED ORDER — CHLORHEXIDINE GLUCONATE 0.12 % MT SOLN
15.0000 mL | Freq: Two times a day (BID) | OROMUCOSAL | Status: DC
  Administered 2011-10-03 – 2011-10-04 (×3): 15 mL via OROMUCOSAL
  Filled 2011-10-03 (×3): qty 15

## 2011-10-03 NOTE — Progress Notes (Signed)
Pt complaining of burning in rectal,perianal area when he wiped after a bowel movement.  Stated that he felt like he had a rash in that area.  On examination, noted several small dark round lesions in gluteal fold and perianal area.  Told pt that I would leave a note in the chart for the MD.

## 2011-10-03 NOTE — Progress Notes (Signed)
[facility] PROGRESS NOTE  [name] RUE:[id number] DOB: November 03, 1955 DOA: 10/01/2011 PCP: Leslye Peer., MD  Assessment/Plan: Active Problems:  Cocaine use  Wegener's granulomatosis with renal involvement  Diarrhea  Hyponatremia  Rash of back  1. Diarrhea: improving. c diff pcr negative, no ova or parasites. Stool cultures are pending. Cytoxan on hold to see if diarrhea improves. CT abdomen and pelvis ordered to evaluate for abdominal pain negative for acute pathology. Continue with IV fluids and anti emetics as needed.  2.  Wegeners Granulomatosis with renal involvement: cytoxan on hold to see if diarrhea improves. Currently on prednisone. Creatinine improving. Spoke to Dr Kathrene Bongo about holding cytoxan for a few days, she agreed and we wll continue to monitor his renal function.  3. Hyponatremia: improving with IV fluids.  4. Pink erythematous rash: possibly from thrombocytopenia.  Improving. Will continue to monitor platelet count.  5. Anemia: baseline hgb is around 9, his hemoglobin is 8.1 today, will transfuse if hgb drops to less then 7.  6. Confluent ulcers near the buttocks; will call ID for further recommendations.  7. DVT prophylaxis  Code Status: full code Family Communication: none at bedside Disposition Plan: pening, possibly home when medically stable   Brief narrative: Alexander Grant is a 56 y.o. male with h/o recently diagnosed Wegeners granulomatosis, discharged from the hospital on 8/17 with cytoxan, came in complaining of persistent diarrhea since 9 days , watery associated with crampy abdominal pain. He denies fever, but reports chills. No h/o vomiting, or nausea. Patient denies chest pain or sob. His stool was sent for Cd iff PCR , came back negative. He is admitted to hospitalist service for evaluation of diarrhea    Consultants:  renal  Procedures:  CT abd and pelvis  Antibiotics:  none  HPI/Subjective: Persistent diarrhea.    Objective: Filed Vitals:   10/02/11 1005 10/02/11 1200 10/02/11 2315 10/03/11 0531  BP: 113/61 112/70 110/66 131/80  Pulse: 80 88 82 78  Temp: 98.3 F (36.8 C) 98.2 F (36.8 C) 98.2 F (36.8 C) 98.5 F (36.9 C)  TempSrc: Oral Oral Oral Oral  Resp: 18 18 18 20   SpO2: 100% 100% 99% 99%    Intake/Output Summary (Last 24 hours) at 10/03/11 1134 Last data filed at 10/03/11 0900  Gross per 24 hour  Intake 4748.67 ml  Output   2601 ml  Net 2147.67 ml   There were no vitals filed for this visit.  Exam:   General:  Alert afebrile comfortable  Cardiovascular: s1s2   Respiratory: CTAB  Abdomen: soft NT ND BS+  Extremities: no pedal edem cyanosis or clubbing.   Data Reviewed: Basic Metabolic Panel:  Lab 10/02/11 1478 10/01/11 0803  NA 132* 128*  K 4.9 4.7  CL 102 94*  CO2 17* 20  GLUCOSE 83 96  BUN 120* 137*  CREATININE 4.95* 5.35*  CALCIUM 8.4 9.6  MG -- --  PHOS -- --   Liver Function Tests:  Lab 10/02/11 0726 10/01/11 0803  AST 10 11  ALT 12 13  ALKPHOS 99 56  BILITOT 0.7 0.6  PROT 4.9* 5.8*  ALBUMIN 2.2* 2.8*   No results found for this basename: LIPASE:5,AMYLASE:5 in the last 168 hours No results found for this basename: AMMONIA:5 in the last 168 hours CBC:  Lab 10/02/11 0726 10/01/11 0803  WBC 4.2 2.4*THIS TEST WAS ORDERED IN ERROR AND HAS BEEN CREDITED. JUST WANT DIFF PER KERRI,ED [id number]  NEUTROABS -- 1.8  HGB 8.1* 9.0*THIS TEST  WAS ORDERED IN ERROR AND HAS BEEN CREDITED. JUST WANT DIFF PER KERRI,ED [id number] [id number]  HCT 23.3* 25.6*THIS TEST WAS ORDERED IN ERROR AND HAS BEEN CREDITED. JUST WANT DIFF PER KERRI,ED [phone]  MCV 88.3 86.5THIS TEST WAS ORDERED IN ERROR AND HAS BEEN CREDITED. JUST WANT DIFF PER KERRI,ED [phone]  PLT 105* 110*THIS TEST WAS ORDERED IN ERROR AND HAS BEEN CREDITED. JUST WANT DIFF PER KERRI,ED [id number] [phone]   Cardiac Enzymes: No results found for this basename: CKTOTAL:5,CKMB:5,CKMBINDEX:5,TROPONINI:5 in the  last 168 hours BNP (last 3 results)  Basename 09/01/11 1422  PROBNP 13030.0*   CBG: No results found for this basename: GLUCAP:5 in the last 168 hours  Recent Results (from the past 240 hour(s))  CLOSTRIDIUM DIFFICILE BY PCR     Status: Normal   Collection Time   10/01/11 10:33 AM      Component Value Range Status Comment   C difficile by pcr NEGATIVE  NEGATIVE Final   STOOL CULTURE     Status: Normal (Preliminary result)   Collection Time   10/01/11 10:33 AM      Component Value Range Status Comment   Specimen Description STOOL   Final    Special Requests NONE   Final    Culture NO SUSPICIOUS COLONIES, CONTINUING TO HOLD   Final    Report Status PENDING   Incomplete   OVA AND PARASITE EXAMINATION     Status: Normal   Collection Time   10/01/11 10:33 AM      Component Value Range Status Comment   Specimen Description STOOL   Final    Special Requests NONE   Final    Ova and parasites NO OVA OR PARASITES SEEN MODERATE YEAST   Final    Report Status 10/02/2011 FINAL   Final      Studies: Dg Chest 2 View  09/08/2011  *RADIOLOGY REPORT*  Clinical Data: Follow up of multi focal airspace opacities  CHEST - 2 VIEW  Comparison: Chest x-ray of 09/03/2011  Findings: The probable edema pattern has largely cleared, and effusions have resolved.  There are still somewhat prominent interstitial markings present particularly in the upper lobes. Mediastinal contours are stable.  The heart is enlarged and stable. No bony abnormality is seen.  IMPRESSION: Resolution of edema pattern with effusions.  Probable chronic interstitial change remains.  Recommend continued follow-up.  Original Report Authenticated By: Juline Patch, M.D.   Dg Chest 2 View  09/03/2011  *RADIOLOGY REPORT*  Clinical Data: Cough, congestion  CHEST - 2 VIEW  Comparison: CT chest dated 09/02/2011  Findings: Multifocal airspace opacities bilaterally, with relative sparing in the bases, grossly unchanged. Small bilateral pleural  effusions are better visualized on prior CT.  Mild cardiomegaly.  The appearance is most suggestive of moderate interstitial edema, less likely multifocal pneumonia.  Mild degenerative changes of the visualized thoracolumbar spine.  IMPRESSION: Stable multifocal airspace opacities bilaterally, most suggestive of moderate interstitial edema, less likely multifocal pneumonia.  Original Report Authenticated By: Charline Bills, M.D.   US Biopsy  09/05/2011  *RADIOLOGY REPORT*  Indication: Acute renal insufficiency of undetermined etiology.  ULTRASOUND GUIDED RENAL BIOPSY  Comparison: Renal ultrasound - 09/01/2011  Medications: Fentanyl 100 mcg IV; Versed 2 mg IV  Total Moderate Sedation time: 15 minutes  Complications: None immediate  Procedure:  Informed written consent was obtained from the patient after a discussion of the risks, benefits and alternatives to treatment. The patient understands and consents to the procedure.  A  timeout was performed prior to the initiation of the procedure.  Ultrasound scanning was performed of the bilateral flanks.  The inferior pole of the right kidney was selected for biopsy due to location and sonographic window.  The procedure was planned.  The operative site was prepped and draped in the usual sterile fashion. The overlying soft tissues were anesthetized with 1% lidocaine with epinephrine.  A 17 gauge core needle biopsy device was advanced into the inferior cortex of the right kidney and 2 core biopsies were obtained under direct ultrasound guidance.  Real time pathologic review confirmed adequate tissue acquisition.  Images were saved for documentation purposes.  The biopsy device was removed and hemostasis was obtained with manual compression.  Post procedural scanning was negative for significant post procedural hemorrhage or additional complication.  A dressing was placed.  The patient tolerated the procedure well without immediate post procedural complication.   Impression:  Technically successful ultrasound guided right renal biopsy.  Original Report Authenticated By: Waynard Reeds, M.D.    Scheduled Meds:    . antiseptic oral rinse  15 mL Mouth Rinse q12n4p  . chlorhexidine  15 mL Mouth Rinse BID  . metoprolol tartrate  25 mg Oral BID  . predniSONE  20 mg Oral Daily  . sodium chloride  1,000 mL Intravenous Once  . sodium chloride  3 mL Intravenous Q12H  . DISCONTD: cyclophosphamide  150 mg Oral QAC breakfast   Continuous Infusions:    . sodium chloride 1,000 mL (10/03/11 0805)    Active Problems:  Cocaine use  Wegener's granulomatosis with renal involvement  Diarrhea  Hyponatremia  Rash of back        [facility]  [facility] Pager [phone]. If 8PM-8AM, please contact night-coverage at [email or web address], password [facility] 10/03/2011, 11:34 AM  LOS: 2 days

## 2011-10-04 DIAGNOSIS — M3130 Wegener's granulomatosis without renal involvement: Secondary | ICD-10-CM

## 2011-10-04 DIAGNOSIS — D62 Acute posthemorrhagic anemia: Secondary | ICD-10-CM

## 2011-10-04 DIAGNOSIS — R21 Rash and other nonspecific skin eruption: Secondary | ICD-10-CM

## 2011-10-04 LAB — CBC
MCV: 87.4 fL (ref 78.0–100.0)
Platelets: 118 10*3/uL — ABNORMAL LOW (ref 150–400)
RBC: 2.39 MIL/uL — ABNORMAL LOW (ref 4.22–5.81)
RDW: 18.9 % — ABNORMAL HIGH (ref 11.5–15.5)
WBC: 4 10*3/uL (ref 4.0–10.5)

## 2011-10-04 LAB — PREPARE RBC (CROSSMATCH)

## 2011-10-04 LAB — ABO/RH: ABO/RH(D): A POS

## 2011-10-04 NOTE — Discharge Summary (Signed)
Physician Discharge Summary  Alexander Grant ZOX:[id number] DOB: [birth year] DOA: 10/01/2011  PCP: Leslye Peer., MD  Admit date: 10/01/2011 Discharge date: 10/04/2011  Recommendations for Outpatient Follow-up:  1. FOLLOW UP WITH PCP in 2 weeks 2. Follow up with GI for evaluation of rash and for possible biopsy on Monday. Spoke to Dr Christella Hartigan about the rash.    Discharge Diagnoses:  Active Problems:  Cocaine use  Wegener's granulomatosis with renal involvement  Diarrhea  Hyponatremia  Rash of back   Discharge Condition:stable  Diet recommendation:  Renal diet  Filed Weights   10/04/11 0610  Weight: 86.183 kg (190 lb)    History of present illness:  Alexander Grant is a 56 y.o. male with recent diagnosis of wegener's granulomatosis with renal involvement in Aug 2013, (pauci immune GN with crescents/FSGS, w/ positive serologies: ANCA+/ANA+/ serine protease +) discharged on cytoxan and pred 80mg , now returns due to on going diarrhea, which has now improved. He reports having a week's worth of sensitivity to his anus, but noticed it quite tender with washing since Wednesday when he noticed ulcers appearing. Occasional blood. No history of herpes. No history of fever blisters. No anal sex. He recently had colonoscopy x 2, where he had 5 polyps removed. Path tubular adenoma.    Hospital Course:  1. Diarrhea: resolved patient had a regular formed bowel movement on discharge. c diff pcr negative, no ova or parasites. Stool cultures have been negative so far.. Cytoxan on hold to see if diarrhea improved. It can be resumed in 2 to 3 days after seeing his renal physician after his appointment on Tuesday. CT abdomen and pelvis ordered to evaluate for abdominal pain negative for acute pathology.  2. Wegeners Granulomatosis with renal involvement: cytoxan on hold to see if diarrhea improves. Currently on prednisone. Creatinine improving. Spoke to Dr Kathrene Bongo about holding cytoxan for a few days,  she agreed and we wll continue to monitor his renal function. 3. Hyponatremia: improving with IV fluids.  Pink erythematous rash: possibly from thrombocytopenia. Improving.   4. Anemia: baseline hgb is around 9, his hemoglobin dropped a little. He got a unit of prbc transfusion and his hemoglobin improved.   5. Confluent ulcers near the buttocks; ID consult called and recommend GI evaluation for possible biopsy. Spoke to Dr Christella Hartigan and recommendation for outpatient biopsy on Monday, as patient wants to go home.    Procedures:  CT ABD AND PELVIS  Consultations: ID Spoke to Dr Christella Hartigan over the phone  Discharge Exam: Filed Vitals:   10/04/11 1427 10/04/11 1527 10/04/11 1627 10/04/11 1645  BP: 144/85 158/88 152/85 144/72  Pulse: 75 70 68 74  Temp: 98.4 F (36.9 C) 97.9 F (36.6 C) 98.6 F (37 C) 98.6 F (37 C)  TempSrc: Oral Oral Oral Oral  Resp: 16 16 16 16   Height:      Weight:      SpO2:       General: Alert afebrile comfortable  Cardiovascular: s1s2  Respiratory: CTAB  Abdomen: soft NT ND BS+  Extremities: no pedal edem cyanosis or clubbing.    Discharge Instructions  Discharge Orders    Future Appointments: Provider: Department: Dept Phone: Center:   11/11/2011 1:30 PM Hart Carwin, MD [facility] [phone] [facility]     Future Orders Please Complete By Expires   Diet - low sodium heart healthy      Discharge instructions      Comments:   FOLLOW UP WITH GASTROENTEROLOGY DR  JACOBS on Monday. Please follow up with nephrologist on Tuesday and see if you can resume your cyclophosphamide. Please get a repeat CBC on Monday or Tuesday at your renal office appointment.   Activity as tolerated - No restrictions        Medication List  As of 10/04/2011  5:54 PM   STOP taking these medications         cyclophosphamide 50 MG tablet         TAKE these medications         amLODipine 5 MG tablet   Commonly known as: NORVASC   Take 1 tablet (5 mg total) by  mouth daily.      calcium acetate 667 MG capsule   Commonly known as: PHOSLO   Take 3 capsules (2,001 mg total) by mouth 3 (three) times daily with meals.      furosemide 40 MG tablet   Commonly known as: LASIX   Take 1 tablet (40 mg total) by mouth 2 (two) times daily.      LOMOTIL PO   Take 3 tablets by mouth 2 (two) times daily.      metoprolol tartrate 25 MG tablet   Commonly known as: LOPRESSOR   Take 1 tablet (25 mg total) by mouth 2 (two) times daily.      omeprazole 40 MG capsule   Commonly known as: PRILOSEC   Take 1 capsule (40 mg total) by mouth 2 (two) times daily.      predniSONE 20 MG tablet   Commonly known as: DELTASONE   Take 20 mg by mouth daily.      sodium bicarbonate 650 MG tablet   Take 1 tablet (650 mg total) by mouth 2 (two) times daily.              The results of significant diagnostics from this hospitalization (including imaging, microbiology, ancillary and laboratory) are listed below for reference.    Significant Diagnostic Studies: Ct Abdomen Pelvis Wo Contrast  10/02/2011  *RADIOLOGY REPORT*  Clinical Data: Abdominal pain with diarrhea for 11 days.  History of peri renal abscess drain, kidney stones, lower GI bleed and Wegener's granulomatosis.  CT ABDOMEN AND PELVIS WITHOUT CONTRAST  Technique:  Multidetector CT imaging of the abdomen and pelvis was performed following the standard protocol without intravenous contrast.  Comparison: Renal ultrasound 09/01/2011.  Chest CT 09/01/2011.  Findings: The lung bases are clear.  There is no pleural effusion.  There is an oval 1.4 cm low density lesion in the dome of the left hepatic lobe on image number 14, likely an incidental cyst.  The liver otherwise appears unremarkable as imaged in the noncontrast state.  The gallbladder, pancreas, spleen and adrenal glands appear normal.  Both kidneys demonstrate symmetric perinephric soft tissue stranding.  There is no focal renal abnormality or calculus.  There  is no hydronephrosis.  There is no evidence of ureteral or bladder calculus.  There are bilateral pelvic phleboliths and scattered vascular calcifications.  The stomach appears normal.  There is a small duodenal diverticulum.  The terminal ileum demonstrates possible mild circumferential wall thickening.  The cecum and appendix appear normal.  There is no colonic wall thickening or surrounding inflammatory change.  A small amount of free pelvic fluid is noted. The prostate gland and seminal vesicles appear normal.  There are no acute or significant osseous findings.  There is no evidence of sacroiliitis.  IMPRESSION:  1.  No specific explanation for diarrhea identified.  However, mild circumferential wall thickening of the terminal ileum, as can be seen with the inflammatory bowel disease, cannot be excluded.  This could be secondary to incomplete distension. 2.  The colon appears normal.  No evidence of bowel obstruction or abscess. 3.  No suspicious parenchymal organ findings.  Probable small hepatic cyst.   Original Report Authenticated By: Gerrianne Scale, M.D.    Dg Chest 2 View  10/02/2011  *RADIOLOGY REPORT*  Clinical Data: Fever  CHEST - 2 VIEW  Comparison: 09/08/2011  Findings: Cardiomediastinal silhouette is within normal limits. The lungs are free of focal consolidations and pleural effusions. No pulmonary edema. Visualized osseous structures have a normal appearance.  Prior right shoulder surgery.  IMPRESSION: Negative exam.   Original Report Authenticated By: Patterson Hammersmith, M.D.    Dg Chest 2 View  09/08/2011  *RADIOLOGY REPORT*  Clinical Data: Follow up of multi focal airspace opacities  CHEST - 2 VIEW  Comparison: Chest x-ray of 09/03/2011  Findings: The probable edema pattern has largely cleared, and effusions have resolved.  There are still somewhat prominent interstitial markings present particularly in the upper lobes. Mediastinal contours are stable.  The heart is enlarged and stable.  No bony abnormality is seen.  IMPRESSION: Resolution of edema pattern with effusions.  Probable chronic interstitial change remains.  Recommend continued follow-up.  Original Report Authenticated By: Juline Patch, M.D.   US Biopsy  09/05/2011  *RADIOLOGY REPORT*  Indication: Acute renal insufficiency of undetermined etiology.  ULTRASOUND GUIDED RENAL BIOPSY  Comparison: Renal ultrasound - 09/01/2011  Medications: Fentanyl 100 mcg IV; Versed 2 mg IV  Total Moderate Sedation time: 15 minutes  Complications: None immediate  Procedure:  Informed written consent was obtained from the patient after a discussion of the risks, benefits and alternatives to treatment. The patient understands and consents to the procedure.  A timeout was performed prior to the initiation of the procedure.  Ultrasound scanning was performed of the bilateral flanks.  The inferior pole of the right kidney was selected for biopsy due to location and sonographic window.  The procedure was planned.  The operative site was prepped and draped in the usual sterile fashion. The overlying soft tissues were anesthetized with 1% lidocaine with epinephrine.  A 17 gauge core needle biopsy device was advanced into the inferior cortex of the right kidney and 2 core biopsies were obtained under direct ultrasound guidance.  Real time pathologic review confirmed adequate tissue acquisition.  Images were saved for documentation purposes.  The biopsy device was removed and hemostasis was obtained with manual compression.  Post procedural scanning was negative for significant post procedural hemorrhage or additional complication.  A dressing was placed.  The patient tolerated the procedure well without immediate post procedural complication.  Impression:  Technically successful ultrasound guided right renal biopsy.  Original Report Authenticated By: Waynard Reeds, M.D.    Microbiology: Recent Results (from the past 240 hour(s))  CLOSTRIDIUM DIFFICILE BY  PCR     Status: Normal   Collection Time   10/01/11 10:33 AM      Component Value Range Status Comment   C difficile by pcr NEGATIVE  NEGATIVE Final   STOOL CULTURE     Status: Normal (Preliminary result)   Collection Time   10/01/11 10:33 AM      Component Value Range Status Comment   Specimen Description STOOL   Final    Special Requests NONE   Final    Culture NO  SUSPICIOUS COLONIES, CONTINUING TO HOLD   Final    Report Status PENDING   Incomplete   OVA AND PARASITE EXAMINATION     Status: Normal   Collection Time   10/01/11 10:33 AM      Component Value Range Status Comment   Specimen Description STOOL   Final    Special Requests NONE   Final    Ova and parasites NO OVA OR PARASITES SEEN MODERATE YEAST   Final    Report Status 10/02/2011 FINAL   Final      Labs: Basic Metabolic Panel:  Lab 10/02/11 2956 10/01/11 0803  NA 132* 128*  K 4.9 4.7  CL 102 94*  CO2 17* 20  GLUCOSE 83 96  BUN 120* 137*  CREATININE 4.95* 5.35*  CALCIUM 8.4 9.6  MG -- --  PHOS -- --   Liver Function Tests:  Lab 10/02/11 0726 10/01/11 0803  AST 10 11  ALT 12 13  ALKPHOS 99 56  BILITOT 0.7 0.6  PROT 4.9* 5.8*  ALBUMIN 2.2* 2.8*   No results found for this basename: LIPASE:5,AMYLASE:5 in the last 168 hours No results found for this basename: AMMONIA:5 in the last 168 hours CBC:  Lab 10/04/11 0515 10/02/11 0726 10/01/11 0803  WBC 4.0 4.2 2.4*THIS TEST WAS ORDERED IN ERROR AND HAS BEEN CREDITED. JUST WANT DIFF PER KERRI,ED [phone]  NEUTROABS -- -- 1.8  HGB 7.3* 8.1* 9.0*THIS TEST WAS ORDERED IN ERROR AND HAS BEEN CREDITED. JUST WANT DIFF PER KERRI,ED [phone]  HCT 20.9* 23.3* 25.6*THIS TEST WAS ORDERED IN ERROR AND HAS BEEN CREDITED. JUST WANT DIFF PER KERRI,ED [phone]  MCV 87.4 88.3 86.5THIS TEST WAS ORDERED IN ERROR AND HAS BEEN CREDITED. JUST WANT DIFF PER KERRI,ED [phone]  PLT 118* 105* 110*THIS TEST WAS ORDERED IN ERROR AND HAS BEEN CREDITED. JUST WANT DIFF PER  KERRI,ED [phone]   Cardiac Enzymes: No results found for this basename: CKTOTAL:5,CKMB:5,CKMBINDEX:5,TROPONINI:5 in the last 168 hours BNP: BNP (last 3 results)  Basename 09/01/11 1422  PROBNP 13030.0*   CBG: No results found for this basename: GLUCAP:5 in the last 168 hours  Time coordinating discharge: 34 minutes  Signed:  Mattye Verdone  [facility] 10/04/2011, 5:54 PM

## 2011-10-04 NOTE — Consult Note (Signed)
Infectious Diseases Initial Consultation  Reason for Consultation:  Skin lesion   HPI: Alexander Grant is a 56 y.o. male with recent diagnosis of wegener's granulomatosis with renal involvement in Aug 2013, (pauci immune GN with crescents/FSGS, w/ positive serologies: ANCA+/ANA+/ serine protease +) discharged on cytoxan and pred 80mg , now returns due to on going diarrhea, which has now improved. He reports having a week's worth of sensitivity to his anus, but noticed it  quite tender with washing since Wednesday when he noticed ulcers appearing. Occasional blood. No history of herpes. No history of fever blisters. No anal sex. He recently had colonoscopy x 2, where he had 5 polyps removed. Path tubular adenoma.  Past Medical History  Diagnosis Date  . Kidney stone   . Fournier gangrene     with peri-rectal/perineal abscess drainage  . PONV (postoperative nausea and vomiting)   . Wegener's granulomatosis with renal involvement   . Anemia   . History of blood transfusion 08/2011  . Lower GI bleeding 10/01/2011    Allergies:  Allergies  Allergen Reactions  . Bee Pollen Anaphylaxis, Shortness Of Breath and Rash  . Percocet (Oxycodone-Acetaminophen) Hives and Rash    Current antibiotics: none  MEDICATIONS:    . antiseptic oral rinse  15 mL Mouth Rinse q12n4p  . chlorhexidine  15 mL Mouth Rinse BID  . metoprolol tartrate  25 mg Oral BID  . predniSONE  20 mg Oral Daily  . sodium chloride  1,000 mL Intravenous Once  . sodium chloride  3 mL Intravenous Q12H    History  Substance Use Topics  . Smoking status: Never Smoker   . Smokeless tobacco: Never Used  . Alcohol Use: Yes     10/01/2011 "last alcohol ~ 2012"    History reviewed. No pertinent family history.   Review of Systems  Constitutional: Negative for fever, chills, diaphoresis, activity change, appetite change, fatigue and unexpected weight change.  HENT: Negative for congestion, sore throat, rhinorrhea, sneezing,  trouble swallowing and sinus pressure.  Eyes: Negative for photophobia and visual disturbance.  Respiratory: Negative for cough, chest tightness, shortness of breath, wheezing and stridor.  Cardiovascular: Negative for chest pain, palpitations and leg swelling.  Gastrointestinal: Negative for nausea, vomiting, abdominal pain, diarrhea, constipation, blood in stool, abdominal distention and anal bleeding.  Genitourinary: Negative for dysuria, hematuria, flank pain and difficulty urinating.  Musculoskeletal: Negative for myalgias, back pain, joint swelling, arthralgias and gait problem.  Skin: Negative for color change, pallor, rash and wound.  Neurological: Negative for dizziness, tremors, weakness and light-headedness.  Hematological: Negative for adenopathy. Does not bruise/bleed easily.  Psychiatric/Behavioral: Negative for behavioral problems, confusion, sleep disturbance, dysphoric mood, decreased concentration and agitation.    OBJECTIVE: Temp:  [97.5 F (36.4 C)-98.7 F (37.1 C)] 98.7 F (37.1 C) (09/07 0600) Pulse Rate:  [66-81] 66  (09/07 0600) Resp:  [18-20] 19  (09/07 0600) BP: (123-156)/(69-97) 156/97 mmHg (09/07 0600) SpO2:  [98 %-100 %] 98 % (09/07 0600) Weight:  [190 lb (86.183 kg)] 190 lb (86.183 kg) (09/07 0610)   General Appearance:    Alert, cooperative, no distress, appears stated age  Head:    Normocephalic, without obvious abnormality, atraumatic  Eyes:    PERRL, conjunctiva/corneas clear, EOM's intact,     Nose:   Nares normal, septum midline, mucosa normal, no drainage   or sinus tenderness  Throat:   Lips, mucosa, and tongue normal; teeth and gums normal  Neck:   Supple, symmetrical, trachea midline, no adenopathy;  Back:     Symmetric, no curvature, ROM normal, no CVA tenderness  Lungs:     Clear to auscultation bilaterally, respirations unlabored     Heart:    Regular rate and rhythm, S1 and S2 normal, no murmur, rub   or gallop  Abdomen:      Soft, non-tender, bowel sounds active all four quadrants,    no masses, no organomegaly  Genitalia:    Normal male without lesion, buttocks has several ulcerative lesions at gluteal cleft, they are also extending into anal canal, a few appear "kissing" lesion on either side of buttock.erythamatous base,     Extremities:   Extremities normal, atraumatic, no cyanosis or edema  Pulses:   2+ and symmetric all extremities  Skin:   See gu exam  Lymph nodes:   Cervical, supraclavicular, and axillary nodes normal  Neurologic:   CNII-XII intact. Normal strength, sensation and reflexes      throughout   LABS: Results for orders placed during the hospital encounter of 10/01/11 (from the past 48 hour(s))  CBC     Status: Abnormal   Collection Time   10/04/11  5:15 AM      Component Value Range Comment   WBC 4.0  4.0 - 10.5 K/uL    RBC 2.39 (*) 4.22 - 5.81 MIL/uL    Hemoglobin 7.3 (*) 13.0 - 17.0 g/dL    HCT 62.1 (*) 30.8 - 52.0 %    MCV 87.4  78.0 - 100.0 fL    MCH 30.5  26.0 - 34.0 pg    MCHC 34.9  30.0 - 36.0 g/dL    RDW 65.7 (*) 84.6 - 15.5 %    Platelets 118 (*) 150 - 400 K/uL CONSISTENT WITH PREVIOUS RESULT  TYPE AND SCREEN     Status: Normal   Collection Time   10/04/11 11:55 AM      Component Value Range Comment   ABO/RH(D) A POS      Antibody Screen NEG      Sample Expiration 10/07/2011     PREPARE RBC (CROSSMATCH)     Status: Normal   Collection Time   10/04/11 11:55 AM      Component Value Range Comment   Order Confirmation ORDER PROCESSED BY BLOOD BANK       MICRO: 9/4 stool cx NGTD 9/4 cdiff  Negative  IMAGING: Ct Abdomen Pelvis Wo Contrast  10/02/2011  *RADIOLOGY REPORT*  Clinical Data: Abdominal pain with diarrhea for 11 days.  History of peri renal abscess drain, kidney stones, lower GI bleed and Wegener's granulomatosis.  CT ABDOMEN AND PELVIS WITHOUT CONTRAST  Technique:  Multidetector CT imaging of the abdomen and pelvis was performed following the standard protocol  without intravenous contrast.  Comparison: Renal ultrasound 09/01/2011.  Chest CT 09/01/2011.  Findings: The lung bases are clear.  There is no pleural effusion.  There is an oval 1.4 cm low density lesion in the dome of the left hepatic lobe on image number 14, likely an incidental cyst.  The liver otherwise appears unremarkable as imaged in the noncontrast state.  The gallbladder, pancreas, spleen and adrenal glands appear normal.  Both kidneys demonstrate symmetric perinephric soft tissue stranding.  There is no focal renal abnormality or calculus.  There is no hydronephrosis.  There is no evidence of ureteral or bladder calculus.  There are bilateral pelvic phleboliths and scattered vascular calcifications.  The stomach appears normal.  There is a small duodenal diverticulum.  The terminal ileum demonstrates  possible mild circumferential wall thickening.  The cecum and appendix appear normal.  There is no colonic wall thickening or surrounding inflammatory change.  A small amount of free pelvic fluid is noted. The prostate gland and seminal vesicles appear normal.  There are no acute or significant osseous findings.  There is no evidence of sacroiliitis.  IMPRESSION:  1.  No specific explanation for diarrhea identified.  However, mild circumferential wall thickening of the terminal ileum, as can be seen with the inflammatory bowel disease, cannot be excluded.  This could be secondary to incomplete distension. 2.  The colon appears normal.  No evidence of bowel obstruction or abscess. 3.  No suspicious parenchymal organ findings.  Probable small hepatic cyst.   Original Report Authenticated By: Gerrianne Scale, M.D.    Dg Chest 2 View  10/02/2011  *RADIOLOGY REPORT*  Clinical Data: Fever  CHEST - 2 VIEW  Comparison: 09/08/2011  Findings: Cardiomediastinal silhouette is within normal limits. The lungs are free of focal consolidations and pleural effusions. No pulmonary edema. Visualized osseous structures have  a normal appearance.  Prior right shoulder surgery.  IMPRESSION: Negative exam.   Original Report Authenticated By: Patterson Hammersmith, M.D.    Assessment/Plan:  56yo Male with wegener's granulomatous disease presented with leukopenia, and diarrhea. Cyclophosphamide held with some mild improvement in leukopenia and diarrhea. Diarrhea work up negative thus far. Has ongoing anal ulcerative lesions  - buttock/anal ulcerative lesion = ddx could include herpes, cmv, aphthous like ulcer. Recommend that he gets biopsy to look at histology, to see if consistent with CMV or HSV vs. Other process. Consider getting GI to do anascopy to see how far lesions track up anal canal.  - will check cmv vl, herpes serology. Will send swab for viral culture.  - once we have tissue for culture and path, can consider doing a trial of antivirals, valtrex. But would wait to get more lab results back.  Thank you for interesting consultation.  Duke Salvia Drue Second MD MPH [facility] [phone]

## 2011-10-04 NOTE — Progress Notes (Signed)
Home care instructions gone over with patient. Verbalized appointments. Home medications gone over. Verbalized having labs drawn at renal appointment. Will inquire about starting back on medication at renal appointment. Discussed low sodium diet , signs and symptoms of low hemoglobin. Patient verbalized understanding of instructions. Telemetry was dc'd and patient discharged.

## 2011-10-04 NOTE — Progress Notes (Signed)
Pt IV d/c'd due to occlusion, pt preferred not to have new IV started. Notified NP on call. Per NP IV needed to be re-started due to pt code status and IV fluids. Pt refused IV re-start. NP notified via text page.

## 2011-10-05 LAB — TYPE AND SCREEN: Antibody Screen: NEGATIVE

## 2011-10-05 LAB — STOOL CULTURE

## 2011-10-08 LAB — VIRAL CULTURE VIRC

## 2011-10-23 ENCOUNTER — Encounter: Payer: Self-pay | Admitting: *Deleted

## 2011-11-11 ENCOUNTER — Ambulatory Visit: Payer: Self-pay | Admitting: Internal Medicine

## 2011-11-24 ENCOUNTER — Encounter (HOSPITAL_COMMUNITY)
Admission: RE | Admit: 2011-11-24 | Discharge: 2011-11-24 | Disposition: A | Discharge status: Home / Self Care | Payer: Self-pay | Source: Ambulatory Visit | Attending: Nephrology | Admitting: Nephrology

## 2011-11-24 DIAGNOSIS — D638 Anemia in other chronic diseases classified elsewhere: Secondary | ICD-10-CM | POA: Insufficient documentation

## 2011-11-24 DIAGNOSIS — N184 Chronic kidney disease, stage 4 (severe): Secondary | ICD-10-CM | POA: Insufficient documentation

## 2011-11-24 LAB — POCT HEMOGLOBIN-HEMACUE: Hemoglobin: 9 g/dL — ABNORMAL LOW (ref 13.0–17.0)

## 2011-11-24 MED ORDER — EPOETIN ALFA 20000 UNIT/ML IJ SOLN
INTRAMUSCULAR | Status: AC
  Filled 2011-11-24: qty 1

## 2011-11-24 MED ORDER — EPOETIN ALFA 20000 UNIT/ML IJ SOLN
20000.0000 [IU] | INTRAMUSCULAR | Status: DC
  Administered 2011-11-24: 20000 [IU] via SUBCUTANEOUS

## 2011-12-04 ENCOUNTER — Encounter: Payer: Self-pay | Admitting: Internal Medicine

## 2011-12-08 ENCOUNTER — Encounter (HOSPITAL_COMMUNITY)
Admission: RE | Admit: 2011-12-08 | Discharge: 2011-12-08 | Disposition: A | Discharge status: Home / Self Care | Payer: Self-pay | Source: Ambulatory Visit | Attending: Nephrology | Admitting: Nephrology

## 2011-12-08 DIAGNOSIS — D638 Anemia in other chronic diseases classified elsewhere: Secondary | ICD-10-CM | POA: Insufficient documentation

## 2011-12-08 DIAGNOSIS — N184 Chronic kidney disease, stage 4 (severe): Secondary | ICD-10-CM | POA: Insufficient documentation

## 2011-12-08 LAB — POCT HEMOGLOBIN-HEMACUE: Hemoglobin: 8.8 g/dL — ABNORMAL LOW (ref 13.0–17.0)

## 2011-12-08 MED ORDER — EPOETIN ALFA 20000 UNIT/ML IJ SOLN
INTRAMUSCULAR | Status: AC
  Filled 2011-12-08: qty 1

## 2011-12-08 MED ORDER — EPOETIN ALFA 20000 UNIT/ML IJ SOLN
20000.0000 [IU] | INTRAMUSCULAR | Status: DC
  Administered 2011-12-08: 20000 [IU] via SUBCUTANEOUS

## 2011-12-22 ENCOUNTER — Other Ambulatory Visit (HOSPITAL_COMMUNITY): Payer: Self-pay | Admitting: *Deleted

## 2011-12-22 ENCOUNTER — Encounter (HOSPITAL_COMMUNITY): Payer: Self-pay

## 2011-12-23 ENCOUNTER — Encounter (HOSPITAL_COMMUNITY)
Admission: RE | Admit: 2011-12-23 | Discharge: 2011-12-23 | Disposition: A | Discharge status: Home / Self Care | Payer: Self-pay | Source: Ambulatory Visit | Attending: Nephrology | Admitting: Nephrology

## 2011-12-23 LAB — RENAL FUNCTION PANEL
Albumin: 3.7 g/dL (ref 3.5–5.2)
CO2: 21 mEq/L (ref 19–32)
Chloride: 103 mEq/L (ref 96–112)
GFR calc Af Amer: 11 mL/min — ABNORMAL LOW (ref >90)
GFR calc non Af Amer: 10 mL/min — ABNORMAL LOW (ref >90)
Sodium: 139 mEq/L (ref 135–145)

## 2011-12-23 LAB — IRON AND TIBC
Saturation Ratios: 35 % (ref 20–55)
TIBC: 336 ug/dL (ref 215–435)

## 2011-12-23 MED ORDER — EPOETIN ALFA 20000 UNIT/ML IJ SOLN: 20000.0000 [IU] | INTRAMUSCULAR | Status: DC

## 2011-12-23 MED ORDER — EPOETIN ALFA 20000 UNIT/ML IJ SOLN
INTRAMUSCULAR | Status: AC
  Administered 2011-12-23: 20000 [IU] via SUBCUTANEOUS
  Filled 2011-12-23: qty 1

## 2011-12-24 LAB — PTH, INTACT AND CALCIUM
Calcium, Total (PTH): 9.1 mg/dL (ref 8.4–10.5)
PTH: 517.3 pg/mL — ABNORMAL HIGH (ref 14.0–72.0)

## 2012-01-06 ENCOUNTER — Encounter (HOSPITAL_COMMUNITY)
Admission: RE | Admit: 2012-01-06 | Discharge: 2012-01-06 | Disposition: A | Discharge status: Home / Self Care | Payer: Self-pay | Source: Ambulatory Visit | Attending: Nephrology | Admitting: Nephrology

## 2012-01-06 DIAGNOSIS — D638 Anemia in other chronic diseases classified elsewhere: Secondary | ICD-10-CM | POA: Insufficient documentation

## 2012-01-06 DIAGNOSIS — N184 Chronic kidney disease, stage 4 (severe): Secondary | ICD-10-CM | POA: Insufficient documentation

## 2012-01-06 LAB — POCT HEMOGLOBIN-HEMACUE: Hemoglobin: 11.2 g/dL — ABNORMAL LOW (ref 13.0–17.0)

## 2012-01-06 MED ORDER — EPOETIN ALFA 20000 UNIT/ML IJ SOLN
20000.0000 [IU] | INTRAMUSCULAR | Status: DC
  Administered 2012-01-06: 20000 [IU] via SUBCUTANEOUS

## 2012-01-06 MED ORDER — EPOETIN ALFA 20000 UNIT/ML IJ SOLN
INTRAMUSCULAR | Status: AC
  Filled 2012-01-06: qty 1

## 2012-01-22 ENCOUNTER — Encounter (HOSPITAL_COMMUNITY)
Admission: RE | Admit: 2012-01-22 | Discharge: 2012-01-22 | Disposition: A | Discharge status: Home / Self Care | Payer: Self-pay | Source: Ambulatory Visit | Attending: Nephrology | Admitting: Nephrology

## 2012-01-22 LAB — RENAL FUNCTION PANEL
Albumin: 3.8 g/dL (ref 3.5–5.2)
BUN: 82 mg/dL — ABNORMAL HIGH (ref 6–23)
Chloride: 101 mEq/L (ref 96–112)
Creatinine, Ser: 9.53 mg/dL — ABNORMAL HIGH (ref 0.50–1.35)

## 2012-01-22 LAB — IRON AND TIBC: TIBC: 327 ug/dL (ref 215–435)

## 2012-01-22 LAB — FERRITIN: Ferritin: 186 ng/mL (ref 22–322)

## 2012-01-22 MED ORDER — EPOETIN ALFA 20000 UNIT/ML IJ SOLN
20000.0000 [IU] | INTRAMUSCULAR | Status: DC
  Administered 2012-01-22: 20000 [IU] via SUBCUTANEOUS

## 2012-01-22 MED ORDER — EPOETIN ALFA 20000 UNIT/ML IJ SOLN
INTRAMUSCULAR | Status: AC
  Administered 2012-01-22: 20000 [IU] via SUBCUTANEOUS
  Filled 2012-01-22: qty 1

## 2012-01-23 LAB — PTH, INTACT AND CALCIUM: PTH: 541.1 pg/mL — ABNORMAL HIGH (ref 14.0–72.0)

## 2012-02-02 ENCOUNTER — Other Ambulatory Visit: Payer: Self-pay

## 2012-02-02 DIAGNOSIS — N184 Chronic kidney disease, stage 4 (severe): Secondary | ICD-10-CM

## 2012-02-02 DIAGNOSIS — N186 End stage renal disease: Secondary | ICD-10-CM

## 2012-02-02 DIAGNOSIS — Z01810 Encounter for preprocedural cardiovascular examination: Secondary | ICD-10-CM

## 2012-02-05 ENCOUNTER — Encounter (HOSPITAL_COMMUNITY)
Admission: RE | Admit: 2012-02-05 | Discharge: 2012-02-05 | Disposition: A | Discharge status: Home / Self Care | Payer: Self-pay | Source: Ambulatory Visit | Attending: Nephrology | Admitting: Nephrology

## 2012-02-05 DIAGNOSIS — D638 Anemia in other chronic diseases classified elsewhere: Secondary | ICD-10-CM | POA: Insufficient documentation

## 2012-02-05 DIAGNOSIS — N184 Chronic kidney disease, stage 4 (severe): Secondary | ICD-10-CM | POA: Insufficient documentation

## 2012-02-05 LAB — POCT HEMOGLOBIN-HEMACUE: Hemoglobin: 11.1 g/dL — ABNORMAL LOW (ref 13.0–17.0)

## 2012-02-05 MED ORDER — EPOETIN ALFA 20000 UNIT/ML IJ SOLN
INTRAMUSCULAR | Status: AC
  Administered 2012-02-05: 20000 [IU] via SUBCUTANEOUS
  Filled 2012-02-05: qty 1

## 2012-02-05 MED ORDER — EPOETIN ALFA 20000 UNIT/ML IJ SOLN
20000.0000 [IU] | INTRAMUSCULAR | Status: DC
  Administered 2012-02-05: 20000 [IU] via SUBCUTANEOUS

## 2012-02-09 ENCOUNTER — Other Ambulatory Visit: Payer: Self-pay | Admitting: *Deleted

## 2012-02-13 ENCOUNTER — Encounter: Payer: Self-pay | Admitting: Surgery

## 2012-02-16 ENCOUNTER — Ambulatory Visit: Payer: Self-pay | Admitting: Surgery

## 2012-02-18 ENCOUNTER — Other Ambulatory Visit (HOSPITAL_COMMUNITY): Payer: Self-pay | Admitting: *Deleted

## 2012-02-19 ENCOUNTER — Encounter (HOSPITAL_COMMUNITY)
Admission: RE | Admit: 2012-02-19 | Discharge: 2012-02-19 | Disposition: A | Discharge status: Home / Self Care | Payer: Self-pay | Source: Ambulatory Visit | Attending: Nephrology | Admitting: Nephrology

## 2012-02-19 LAB — RENAL FUNCTION PANEL
BUN: 72 mg/dL — ABNORMAL HIGH (ref 6–23)
CO2: 23 mEq/L (ref 19–32)
Calcium: 9.8 mg/dL (ref 8.4–10.5)
Creatinine, Ser: 8.5 mg/dL — ABNORMAL HIGH (ref 0.50–1.35)
GFR calc non Af Amer: 6 mL/min — ABNORMAL LOW (ref >90)
Glucose, Bld: 91 mg/dL (ref 70–99)

## 2012-02-19 LAB — FERRITIN: Ferritin: 144 ng/mL (ref 22–322)

## 2012-02-19 LAB — IRON AND TIBC
Saturation Ratios: 26 % (ref 20–55)
UIBC: 212 ug/dL (ref 125–400)

## 2012-02-19 LAB — POCT HEMOGLOBIN-HEMACUE: Hemoglobin: 10.9 g/dL — ABNORMAL LOW (ref 13.0–17.0)

## 2012-02-19 MED ORDER — EPOETIN ALFA 20000 UNIT/ML IJ SOLN: 20000.0000 [IU] | INTRAMUSCULAR | Status: DC

## 2012-02-19 MED ORDER — EPOETIN ALFA 20000 UNIT/ML IJ SOLN
INTRAMUSCULAR | Status: AC
  Administered 2012-02-19: 20000 [IU] via SUBCUTANEOUS
  Filled 2012-02-19: qty 1

## 2012-02-20 LAB — PTH, INTACT AND CALCIUM: Calcium, Total (PTH): 9.8 mg/dL (ref 8.4–10.5)

## 2012-02-25 ENCOUNTER — Encounter: Payer: Self-pay | Admitting: Internal Medicine

## 2012-02-25 ENCOUNTER — Telehealth: Payer: Self-pay | Admitting: *Deleted

## 2012-02-25 NOTE — Telephone Encounter (Signed)
Spoke with patient and he wants to call back to schedule his procedure because he has lots of appointments right now. States he will call next week to schedule.

## 2012-02-25 NOTE — Telephone Encounter (Signed)
Dr. Juanda Chance, You saw this patient as a consult in the hospital. Dr. Rhea Belton did a colonoscopy 09/11/11 and requested a recall colonoscopy in Feb.2014 due to poor prep and numerous polyps. The patient has renal problems. Does he need an OV prior to procedure? Please, advise.

## 2012-02-25 NOTE — Telephone Encounter (Signed)
He can be a direct colon. No anticoagulants. Was on prednisone. He was not prep for his first colon so he will need a bottle of Mag citrate 2 days before. In the evening.. Make sure he calls [place] if he does not think he is clear.

## 2012-03-05 ENCOUNTER — Telehealth: Payer: Self-pay | Admitting: *Deleted

## 2012-03-05 NOTE — Telephone Encounter (Signed)
Message copied by Daphine Deutscher on Fri Mar 05, 2012  8:42 AM ------      Message from: Daphine Deutscher      Created: Wed Feb 25, 2012  2:11 PM       Did patient schedule direct colonoscopy with DB(needs magnesium citrate with prep)

## 2012-03-05 NOTE — Telephone Encounter (Signed)
Patient did not keep his f/u OV in October. Spoke with patient and he states he will call back next week to schedule OV and/or colonoscopy. He states he has a lot going on with renal problems also.

## 2012-03-10 ENCOUNTER — Other Ambulatory Visit (HOSPITAL_COMMUNITY): Payer: Self-pay | Admitting: *Deleted

## 2012-03-11 ENCOUNTER — Encounter (HOSPITAL_COMMUNITY)
Admission: RE | Admit: 2012-03-11 | Discharge: 2012-03-11 | Disposition: A | Discharge status: Home / Self Care | Payer: Self-pay | Source: Ambulatory Visit | Attending: Nephrology | Admitting: Nephrology

## 2012-03-11 DIAGNOSIS — D638 Anemia in other chronic diseases classified elsewhere: Secondary | ICD-10-CM | POA: Insufficient documentation

## 2012-03-11 DIAGNOSIS — N184 Chronic kidney disease, stage 4 (severe): Secondary | ICD-10-CM | POA: Insufficient documentation

## 2012-03-11 LAB — RENAL FUNCTION PANEL
Albumin: 4.2 g/dL (ref 3.5–5.2)
CO2: 20 mEq/L (ref 19–32)
Chloride: 101 mEq/L (ref 96–112)
GFR calc Af Amer: 7 mL/min — ABNORMAL LOW (ref >90)
GFR calc non Af Amer: 6 mL/min — ABNORMAL LOW (ref >90)
Sodium: 141 mEq/L (ref 135–145)

## 2012-03-11 LAB — IRON AND TIBC
Saturation Ratios: 51 % (ref 20–55)
TIBC: 317 ug/dL (ref 215–435)

## 2012-03-11 MED ORDER — FERUMOXYTOL INJECTION 510 MG/17 ML
510.0000 mg | INTRAVENOUS | Status: DC
  Administered 2012-03-11: 510 mg via INTRAVENOUS

## 2012-03-11 MED ORDER — EPOETIN ALFA 20000 UNIT/ML IJ SOLN
20000.0000 [IU] | INTRAMUSCULAR | Status: DC
  Administered 2012-03-11: 20000 [IU] via SUBCUTANEOUS

## 2012-03-11 MED ORDER — FERUMOXYTOL INJECTION 510 MG/17 ML
INTRAVENOUS | Status: AC
  Filled 2012-03-11: qty 17

## 2012-03-11 MED ORDER — SODIUM CHLORIDE 0.9 % IV SOLN
INTRAVENOUS | Status: DC
  Administered 2012-03-11: 09:00:00 via INTRAVENOUS

## 2012-03-11 MED ORDER — EPOETIN ALFA 20000 UNIT/ML IJ SOLN
INTRAMUSCULAR | Status: AC
  Filled 2012-03-11: qty 1

## 2012-03-12 LAB — PTH, INTACT AND CALCIUM
Calcium, Total (PTH): 9.7 mg/dL (ref 8.4–10.5)
PTH: 559.4 pg/mL — ABNORMAL HIGH (ref 14.0–72.0)

## 2012-03-18 ENCOUNTER — Encounter (HOSPITAL_COMMUNITY)
Admission: RE | Admit: 2012-03-18 | Discharge: 2012-03-18 | Disposition: A | Discharge status: Home / Self Care | Payer: Self-pay | Source: Ambulatory Visit | Attending: Nephrology | Admitting: Nephrology

## 2012-03-18 MED ORDER — FERUMOXYTOL INJECTION 510 MG/17 ML
510.0000 mg | INTRAVENOUS | Status: AC
  Administered 2012-03-18: 510 mg via INTRAVENOUS

## 2012-03-18 MED ORDER — FERUMOXYTOL INJECTION 510 MG/17 ML
INTRAVENOUS | Status: AC
  Filled 2012-03-18: qty 17

## 2012-03-18 MED ORDER — SODIUM CHLORIDE 0.9 % IV SOLN
INTRAVENOUS | Status: AC
  Administered 2012-03-18: 10:00:00 via INTRAVENOUS

## 2012-03-29 ENCOUNTER — Telehealth: Payer: Self-pay | Admitting: *Deleted

## 2012-03-29 NOTE — Telephone Encounter (Signed)
Message copied by Daphine Deutscher on Mon Mar 29, 2012  8:26 AM ------      Message from: Daphine Deutscher      Created: Fri Mar 05, 2012  8:48 AM       Did patient schedule OV/colonoscopy with DB for repeat colon(needs mag citrate also) ------

## 2012-03-29 NOTE — Telephone Encounter (Signed)
Would not  Expect him to keep his appointment based on my acquaintance  With him in the hospital.

## 2012-03-29 NOTE — Telephone Encounter (Signed)
Patient has not set up his return OV or recall colonoscopy even after I called him and reminded him. He has also received a recall letter from [place].Just wanted you to be aware.

## 2012-04-01 ENCOUNTER — Encounter (HOSPITAL_COMMUNITY)
Admission: RE | Admit: 2012-04-01 | Discharge: 2012-04-01 | Disposition: A | Discharge status: Home / Self Care | Payer: Self-pay | Source: Ambulatory Visit | Attending: Nephrology | Admitting: Nephrology

## 2012-04-01 DIAGNOSIS — D638 Anemia in other chronic diseases classified elsewhere: Secondary | ICD-10-CM | POA: Insufficient documentation

## 2012-04-01 DIAGNOSIS — N184 Chronic kidney disease, stage 4 (severe): Secondary | ICD-10-CM | POA: Insufficient documentation

## 2012-04-01 LAB — POCT HEMOGLOBIN-HEMACUE: Hemoglobin: 10.9 g/dL — ABNORMAL LOW (ref 13.0–17.0)

## 2012-04-01 MED ORDER — EPOETIN ALFA 20000 UNIT/ML IJ SOLN
20000.0000 [IU] | INTRAMUSCULAR | Status: DC
  Administered 2012-04-01: 20000 [IU] via SUBCUTANEOUS

## 2012-04-01 MED ORDER — EPOETIN ALFA 20000 UNIT/ML IJ SOLN
INTRAMUSCULAR | Status: AC
  Administered 2012-04-01: 20000 [IU] via SUBCUTANEOUS
  Filled 2012-04-01: qty 1

## 2012-04-22 ENCOUNTER — Encounter (HOSPITAL_COMMUNITY)
Admission: RE | Admit: 2012-04-22 | Discharge: 2012-04-22 | Disposition: A | Discharge status: Home / Self Care | Payer: Self-pay | Source: Ambulatory Visit | Attending: Nephrology | Admitting: Nephrology

## 2012-04-22 LAB — RENAL FUNCTION PANEL
BUN: 94 mg/dL — ABNORMAL HIGH (ref 6–23)
CO2: 24 mEq/L (ref 19–32)
Chloride: 98 mEq/L (ref 96–112)
Creatinine, Ser: 10 mg/dL — ABNORMAL HIGH (ref 0.50–1.35)
Glucose, Bld: 101 mg/dL — ABNORMAL HIGH (ref 70–99)

## 2012-04-22 LAB — IRON AND TIBC
Saturation Ratios: 47 % (ref 20–55)
TIBC: 297 ug/dL (ref 215–435)
UIBC: 156 ug/dL (ref 125–400)

## 2012-04-22 LAB — FERRITIN: Ferritin: 706 ng/mL — ABNORMAL HIGH (ref 22–322)

## 2012-04-22 LAB — POCT HEMOGLOBIN-HEMACUE: Hemoglobin: 10.6 g/dL — ABNORMAL LOW (ref 13.0–17.0)

## 2012-04-22 MED ORDER — EPOETIN ALFA 20000 UNIT/ML IJ SOLN
INTRAMUSCULAR | Status: AC
  Filled 2012-04-22: qty 1

## 2012-04-22 MED ORDER — EPOETIN ALFA 20000 UNIT/ML IJ SOLN
20000.0000 [IU] | INTRAMUSCULAR | Status: DC
  Administered 2012-04-22: 20000 [IU] via SUBCUTANEOUS

## 2012-05-12 ENCOUNTER — Other Ambulatory Visit (HOSPITAL_COMMUNITY): Payer: Self-pay | Admitting: *Deleted

## 2012-05-13 ENCOUNTER — Encounter (HOSPITAL_COMMUNITY)
Admission: RE | Admit: 2012-05-13 | Discharge: 2012-05-13 | Disposition: A | Discharge status: Home / Self Care | Payer: Self-pay | Source: Ambulatory Visit | Attending: Nephrology | Admitting: Nephrology

## 2012-05-13 DIAGNOSIS — D638 Anemia in other chronic diseases classified elsewhere: Secondary | ICD-10-CM | POA: Insufficient documentation

## 2012-05-13 DIAGNOSIS — N184 Chronic kidney disease, stage 4 (severe): Secondary | ICD-10-CM | POA: Insufficient documentation

## 2012-05-13 MED ORDER — EPOETIN ALFA 20000 UNIT/ML IJ SOLN: 20000.0000 [IU] | INTRAMUSCULAR | Status: DC

## 2012-05-13 MED ORDER — EPOETIN ALFA 20000 UNIT/ML IJ SOLN
INTRAMUSCULAR | Status: AC
  Administered 2012-05-13: 20000 [IU] via SUBCUTANEOUS
  Filled 2012-05-13: qty 1

## 2012-06-03 ENCOUNTER — Encounter (HOSPITAL_COMMUNITY)
Admission: RE | Admit: 2012-06-03 | Discharge: 2012-06-03 | Disposition: A | Discharge status: Home / Self Care | Payer: Self-pay | Source: Ambulatory Visit | Attending: Nephrology | Admitting: Nephrology

## 2012-06-03 DIAGNOSIS — D638 Anemia in other chronic diseases classified elsewhere: Secondary | ICD-10-CM | POA: Insufficient documentation

## 2012-06-03 DIAGNOSIS — N184 Chronic kidney disease, stage 4 (severe): Secondary | ICD-10-CM | POA: Insufficient documentation

## 2012-06-03 LAB — RENAL FUNCTION PANEL
Albumin: 4.2 g/dL (ref 3.5–5.2)
BUN: 100 mg/dL — ABNORMAL HIGH (ref 6–23)
Chloride: 103 mEq/L (ref 96–112)
Creatinine, Ser: 7.03 mg/dL — ABNORMAL HIGH (ref 0.50–1.35)
Phosphorus: 6.4 mg/dL — ABNORMAL HIGH (ref 2.3–4.6)

## 2012-06-03 LAB — FERRITIN: Ferritin: 493 ng/mL — ABNORMAL HIGH (ref 22–322)

## 2012-06-03 LAB — IRON AND TIBC: TIBC: 286 ug/dL (ref 215–435)

## 2012-06-03 LAB — POCT HEMOGLOBIN-HEMACUE: Hemoglobin: 9.7 g/dL — ABNORMAL LOW (ref 13.0–17.0)

## 2012-06-03 MED ORDER — EPOETIN ALFA 20000 UNIT/ML IJ SOLN
20000.0000 [IU] | INTRAMUSCULAR | Status: DC
  Administered 2012-06-03: 20000 [IU] via SUBCUTANEOUS

## 2012-06-03 MED ORDER — EPOETIN ALFA 20000 UNIT/ML IJ SOLN
INTRAMUSCULAR | Status: AC
  Filled 2012-06-03: qty 1

## 2012-06-04 LAB — PTH, INTACT AND CALCIUM: PTH: 1023.8 pg/mL — ABNORMAL HIGH (ref 14.0–72.0)

## 2012-06-15 ENCOUNTER — Ambulatory Visit: Payer: Self-pay | Admitting: Vascular Surgery

## 2012-07-01 ENCOUNTER — Encounter (HOSPITAL_COMMUNITY)
Admission: RE | Admit: 2012-07-01 | Discharge: 2012-07-01 | Disposition: A | Discharge status: Home / Self Care | Payer: Self-pay | Source: Ambulatory Visit | Attending: Nephrology | Admitting: Nephrology

## 2012-07-01 DIAGNOSIS — D638 Anemia in other chronic diseases classified elsewhere: Secondary | ICD-10-CM | POA: Insufficient documentation

## 2012-07-01 DIAGNOSIS — N184 Chronic kidney disease, stage 4 (severe): Secondary | ICD-10-CM | POA: Insufficient documentation

## 2012-07-01 LAB — IRON AND TIBC
Iron: 124 ug/dL (ref 42–135)
UIBC: 151 ug/dL (ref 125–400)

## 2012-07-01 LAB — RENAL FUNCTION PANEL
Albumin: 4 g/dL (ref 3.5–5.2)
Calcium: 9.8 mg/dL (ref 8.4–10.5)
GFR calc Af Amer: 8 mL/min — ABNORMAL LOW (ref >90)
GFR calc non Af Amer: 7 mL/min — ABNORMAL LOW (ref >90)
Glucose, Bld: 98 mg/dL (ref 70–99)
Phosphorus: 7 mg/dL — ABNORMAL HIGH (ref 2.3–4.6)
Potassium: 4.7 mEq/L (ref 3.5–5.1)
Sodium: 139 mEq/L (ref 135–145)

## 2012-07-01 MED ORDER — EPOETIN ALFA 20000 UNIT/ML IJ SOLN
INTRAMUSCULAR | Status: AC
  Filled 2012-07-01: qty 1

## 2012-07-01 MED ORDER — EPOETIN ALFA 20000 UNIT/ML IJ SOLN
20000.0000 [IU] | INTRAMUSCULAR | Status: DC
  Administered 2012-07-01: 20000 [IU] via SUBCUTANEOUS

## 2012-07-22 ENCOUNTER — Encounter (HOSPITAL_COMMUNITY)
Admission: RE | Admit: 2012-07-22 | Discharge: 2012-07-22 | Disposition: A | Discharge status: Home / Self Care | Payer: Self-pay | Source: Ambulatory Visit | Attending: Nephrology | Admitting: Nephrology

## 2012-07-22 LAB — POCT HEMOGLOBIN-HEMACUE: Hemoglobin: 10.1 g/dL — ABNORMAL LOW (ref 13.0–17.0)

## 2012-07-22 MED ORDER — EPOETIN ALFA 20000 UNIT/ML IJ SOLN
INTRAMUSCULAR | Status: AC
  Administered 2012-07-22: 20000 [IU] via SUBCUTANEOUS
  Filled 2012-07-22: qty 1

## 2012-07-22 MED ORDER — EPOETIN ALFA 20000 UNIT/ML IJ SOLN: 20000.0000 [IU] | INTRAMUSCULAR | Status: DC

## 2012-07-23 LAB — PTH, INTACT AND CALCIUM
Calcium, Total (PTH): 9.6 mg/dL (ref 8.4–10.5)
PTH: 1081.6 pg/mL — ABNORMAL HIGH (ref 14.0–72.0)

## 2012-07-29 ENCOUNTER — Emergency Department (HOSPITAL_COMMUNITY)
Admission: EM | Admit: 2012-07-29 | Discharge: 2012-07-30 | Discharge status: Against Medical Advice | Payer: Self-pay | Attending: Emergency Medicine | Admitting: Emergency Medicine

## 2012-07-29 ENCOUNTER — Other Ambulatory Visit: Payer: Self-pay

## 2012-07-29 DIAGNOSIS — Z8719 Personal history of other diseases of the digestive system: Secondary | ICD-10-CM | POA: Insufficient documentation

## 2012-07-29 DIAGNOSIS — Z87442 Personal history of urinary calculi: Secondary | ICD-10-CM | POA: Insufficient documentation

## 2012-07-29 DIAGNOSIS — Z8679 Personal history of other diseases of the circulatory system: Secondary | ICD-10-CM | POA: Insufficient documentation

## 2012-07-29 DIAGNOSIS — F911 Conduct disorder, childhood-onset type: Secondary | ICD-10-CM | POA: Insufficient documentation

## 2012-07-29 DIAGNOSIS — IMO0002 Reserved for concepts with insufficient information to code with codable children: Secondary | ICD-10-CM | POA: Insufficient documentation

## 2012-07-29 DIAGNOSIS — K219 Gastro-esophageal reflux disease without esophagitis: Secondary | ICD-10-CM | POA: Insufficient documentation

## 2012-07-29 DIAGNOSIS — Z8639 Personal history of other endocrine, nutritional and metabolic disease: Secondary | ICD-10-CM | POA: Insufficient documentation

## 2012-07-29 DIAGNOSIS — R451 Restlessness and agitation: Secondary | ICD-10-CM

## 2012-07-29 DIAGNOSIS — Z87448 Personal history of other diseases of urinary system: Secondary | ICD-10-CM | POA: Insufficient documentation

## 2012-07-29 DIAGNOSIS — R443 Hallucinations, unspecified: Secondary | ICD-10-CM | POA: Insufficient documentation

## 2012-07-29 DIAGNOSIS — Z79899 Other long term (current) drug therapy: Secondary | ICD-10-CM | POA: Insufficient documentation

## 2012-07-29 DIAGNOSIS — Z862 Personal history of diseases of the blood and blood-forming organs and certain disorders involving the immune mechanism: Secondary | ICD-10-CM | POA: Insufficient documentation

## 2012-07-29 NOTE — ED Provider Notes (Signed)
History    CSN: [id number] Arrival date & time 07/29/12  8295  First MD Initiated Contact with Patient 07/29/12 1913     No chief complaint on file.  (Consider location/radiation/quality/duration/timing/severity/associated sxs/prior Treatment) HPI Comments: Pt here for agitation. States he was arguing w/ wife. Became upset, had taken valium earlier - for first time, also drank ETOH. Noted brief episode of hallucinations - bugs crawling on him. Sx quickly resolved. Verbal altercation - no physical. Denies SI/HI. Has since calmed down and is denying complaints. Pt is not a heavy drinker.   Patient is a 57 y.o. male presenting with mental health disorder. The history is provided by the patient. No language interpreter was used.  Mental Health Problem Presenting symptoms: aggressive behavior, agitation and hallucinations   Degree of incapacity (severity):  Moderate Onset quality:  Sudden Progression:  Resolved Chronicity:  New Context: alcohol use and medication   Associated symptoms: no abdominal pain, no chest pain and no headaches    Past Medical History  Diagnosis Date  . Kidney stone   . Fournier gangrene     with peri-rectal/perineal abscess drainage  . PONV (postoperative nausea and vomiting)   . Wegener's granulomatosis with renal involvement   . Anemia   . History of blood transfusion 08/2011  . Lower GI bleeding 10/01/2011  . Cocaine abuse 2013  . Tubular adenoma of colon   . Hyponatremia   . Acute renal failure   . Pulmonary infiltrate   . Reflux esophagitis   . Diverticulosis   . Right thyroid nodule   . Cardiomegaly    Past Surgical History  Procedure Laterality Date  . Esophagogastroduodenoscopy  09/10/2011    Procedure: ESOPHAGOGASTRODUODENOSCOPY (EGD);  Surgeon: Beverley Fiedler, MD;  Location: [facility] ENDOSCOPY;  Service: Gastroenterology;  Laterality: N/A;  . Colonoscopy  09/11/2011    Procedure: COLONOSCOPY;  Surgeon: Beverley Fiedler, MD;  Location: [facility] ENDOSCOPY;   Service: Gastroenterology;  Laterality: N/A;  . Knee arthroscopy w/ medial collateral ligament (mcl) repair  ~ 2000    left  . Knee arthroscopy w/ acl reconstruction  2002  . Shoulder open rotator cuff repair   09/1999    right  . Incision and drainage perirectal abscess  05/2008    'for Fournier's gangrene"  . Kidney stone surgery  1990   No family history on file. History  Substance Use Topics  . Smoking status: Never Smoker   . Smokeless tobacco: Never Used  . Alcohol Use: Yes     Comment: 10/01/2011 "last alcohol ~ 2012"    Review of Systems  Constitutional: Negative for fever and chills.  HENT: Negative for congestion and sore throat.   Respiratory: Negative for cough and shortness of breath.   Cardiovascular: Negative for chest pain and leg swelling.  Gastrointestinal: Negative for nausea, vomiting, abdominal pain, diarrhea and constipation.  Genitourinary: Negative for dysuria and frequency.  Skin: Negative for color change and rash.  Neurological: Negative for dizziness and headaches.  Psychiatric/Behavioral: Positive for hallucinations and agitation. Negative for confusion.  All other systems reviewed and are negative.    Allergies  Bee pollen and Percocet  Home Medications   Current Outpatient Rx  Name  Route  Sig  Dispense  Refill  . amLODipine (NORVASC) 5 MG tablet   Oral   Take 1 tablet (5 mg total) by mouth daily.   30 tablet   0   . calcium acetate (PHOSLO) 667 MG capsule   Oral   Take  3 capsules (2,001 mg total) by mouth 3 (three) times daily with meals.   90 capsule   0   . Diphenoxylate-Atropine (LOMOTIL PO)   Oral   Take 3 tablets by mouth 2 (two) times daily.         . furosemide (LASIX) 40 MG tablet   Oral   Take 1 tablet (40 mg total) by mouth 2 (two) times daily.   30 tablet   0   . metoprolol tartrate (LOPRESSOR) 25 MG tablet   Oral   Take 1 tablet (25 mg total) by mouth 2 (two) times daily.   60 tablet   0   . omeprazole  (PRILOSEC) 40 MG capsule   Oral   Take 1 capsule (40 mg total) by mouth 2 (two) times daily.   60 capsule   0   . predniSONE (DELTASONE) 20 MG tablet   Oral   Take 20 mg by mouth daily.          . sodium bicarbonate 650 MG tablet   Oral   Take 1 tablet (650 mg total) by mouth 2 (two) times daily.   30 tablet   0    There were no vitals taken for this visit. Physical Exam  Constitutional: He is oriented to person, place, and time. He appears well-developed and well-nourished. No distress.  HENT:  Head: Normocephalic and atraumatic.  Eyes: EOM are normal. Pupils are equal, round, and reactive to light.  Neck: Normal range of motion. Neck supple.  Cardiovascular: Normal rate and regular rhythm.   Pulmonary/Chest: Effort normal. No respiratory distress.  Abdominal: Soft. He exhibits no distension.  Musculoskeletal: Normal range of motion. He exhibits no edema.  Neurological: He is alert and oriented to person, place, and time.  Skin: Skin is warm and dry.  Psychiatric: He has a normal mood and affect. His speech is normal and behavior is normal. Thought content normal. His mood appears not anxious. His affect is not angry. He is not agitated. Thought content is not paranoid and not delusional. He does not exhibit a depressed mood. He expresses no homicidal and no suicidal ideation. He expresses no suicidal plans and no homicidal plans.    ED Course  Procedures (including critical care time) Labs Reviewed - No data to display No results found. No diagnosis found.  MDM  Dispute w/ wife. Verbal altercation - not physical.  Pt denies SI/HI. Wife feels safe for pt to come home - no threat. No clinically intoxicated or agitated. No longer hallucinating. Recommend refrain from taking valium again. Stable for d/c home. Given return precautions.   1. Agitation    Leslye Peer, MD [address] [place] [address] [phone]  Schedule an appointment as soon as possible  for a visit As needed if symptoms worsen  Case d/w Dr Phil Dopp, MD 07/30/12 [phone]

## 2012-07-29 NOTE — ED Provider Notes (Signed)
57 year old male received a dose of diazepam and and became very agitated and was hallucinating. He is seeing bugs on herself. He has since calmed down and is no longer hallucinating and is back to normal mentation. His wife is a pleasant and concurs that his mental status is back to baseline. He appears to have had a reaction to diazepam is advised not to take it in the future. There is no indication for any additional evaluation.   Date: 07/29/2012  Rate: 122  Rhythm: sinus tachycardia  QRS Axis: left  Intervals: normal  ST/T Wave abnormalities: normal  Conduction Disutrbances:left anterior fascicular block  Narrative Interpretation: Sinus tachycardia, left anterior fascicular block. When compared with ECG of 09/01/2011, no significant changes are seen.  Old EKG Reviewed: unchanged  Images viewed by me.  I saw and evaluated the patient, reviewed the resident's note and I agree with the findings and plan.   Dione Booze, MD 07/29/12 [phone]

## 2012-07-30 NOTE — ED Notes (Signed)
Patient taken out of ED by PTAR.  Patient refused to be seen at this time.

## 2012-08-12 ENCOUNTER — Encounter (HOSPITAL_COMMUNITY): Payer: Self-pay

## 2012-08-18 ENCOUNTER — Other Ambulatory Visit (HOSPITAL_COMMUNITY): Payer: Self-pay | Admitting: *Deleted

## 2012-08-19 ENCOUNTER — Encounter (HOSPITAL_COMMUNITY)
Admission: RE | Admit: 2012-08-19 | Discharge: 2012-08-19 | Disposition: A | Discharge status: Home / Self Care | Payer: Self-pay | Source: Ambulatory Visit | Attending: Nephrology | Admitting: Nephrology

## 2012-08-19 DIAGNOSIS — N184 Chronic kidney disease, stage 4 (severe): Secondary | ICD-10-CM | POA: Insufficient documentation

## 2012-08-19 DIAGNOSIS — D638 Anemia in other chronic diseases classified elsewhere: Secondary | ICD-10-CM | POA: Insufficient documentation

## 2012-08-19 LAB — POCT HEMOGLOBIN-HEMACUE: Hemoglobin: 9.1 g/dL — ABNORMAL LOW (ref 13.0–17.0)

## 2012-08-19 LAB — IRON AND TIBC
Iron: 72 ug/dL (ref 42–135)
TIBC: 238 ug/dL (ref 215–435)

## 2012-08-19 LAB — RENAL FUNCTION PANEL
Albumin: 3.4 g/dL — ABNORMAL LOW (ref 3.5–5.2)
BUN: 62 mg/dL — ABNORMAL HIGH (ref 6–23)
Chloride: 106 mEq/L (ref 96–112)
GFR calc Af Amer: 6 mL/min — ABNORMAL LOW (ref >90)
GFR calc non Af Amer: 5 mL/min — ABNORMAL LOW (ref >90)
Phosphorus: 6.3 mg/dL — ABNORMAL HIGH (ref 2.3–4.6)
Potassium: 4.2 mEq/L (ref 3.5–5.1)
Sodium: 145 mEq/L (ref 135–145)

## 2012-08-19 LAB — FERRITIN: Ferritin: 305 ng/mL (ref 22–322)

## 2012-08-19 MED ORDER — EPOETIN ALFA 20000 UNIT/ML IJ SOLN: 20000.0000 [IU] | INTRAMUSCULAR | Status: DC

## 2012-08-19 MED ORDER — EPOETIN ALFA 20000 UNIT/ML IJ SOLN
INTRAMUSCULAR | Status: AC
  Administered 2012-08-19: 20000 [IU] via SUBCUTANEOUS
  Filled 2012-08-19: qty 1

## 2012-08-23 ENCOUNTER — Encounter: Payer: Self-pay | Admitting: Vascular Surgery

## 2012-08-24 ENCOUNTER — Ambulatory Visit: Payer: Self-pay | Admitting: Vascular Surgery

## 2012-09-09 ENCOUNTER — Encounter (HOSPITAL_COMMUNITY)
Admission: RE | Admit: 2012-09-09 | Discharge: 2012-09-09 | Disposition: A | Discharge status: Home / Self Care | Payer: Self-pay | Source: Ambulatory Visit | Attending: Nephrology | Admitting: Nephrology

## 2012-09-09 DIAGNOSIS — N184 Chronic kidney disease, stage 4 (severe): Secondary | ICD-10-CM | POA: Insufficient documentation

## 2012-09-09 DIAGNOSIS — D638 Anemia in other chronic diseases classified elsewhere: Secondary | ICD-10-CM | POA: Insufficient documentation

## 2012-09-09 LAB — POCT HEMOGLOBIN-HEMACUE: Hemoglobin: 10 g/dL — ABNORMAL LOW (ref 13.0–17.0)

## 2012-09-09 MED ORDER — EPOETIN ALFA 20000 UNIT/ML IJ SOLN
INTRAMUSCULAR | Status: AC
  Administered 2012-09-09: 20000 [IU] via SUBCUTANEOUS
  Filled 2012-09-09: qty 1

## 2012-09-09 MED ORDER — EPOETIN ALFA 20000 UNIT/ML IJ SOLN: 20000.0000 [IU] | INTRAMUSCULAR | Status: DC

## 2012-09-10 LAB — PTH, INTACT AND CALCIUM: PTH: 1071.3 pg/mL — ABNORMAL HIGH (ref 14.0–72.0)

## 2012-09-30 ENCOUNTER — Encounter (HOSPITAL_COMMUNITY)
Admission: RE | Admit: 2012-09-30 | Discharge: 2012-09-30 | Disposition: A | Discharge status: Home / Self Care | Payer: Medicaid Other | Source: Ambulatory Visit | Attending: Nephrology | Admitting: Nephrology

## 2012-09-30 DIAGNOSIS — N184 Chronic kidney disease, stage 4 (severe): Secondary | ICD-10-CM | POA: Insufficient documentation

## 2012-09-30 DIAGNOSIS — D638 Anemia in other chronic diseases classified elsewhere: Secondary | ICD-10-CM | POA: Insufficient documentation

## 2012-09-30 LAB — POCT HEMOGLOBIN-HEMACUE: Hemoglobin: 9.4 g/dL — ABNORMAL LOW (ref 13.0–17.0)

## 2012-09-30 LAB — IRON AND TIBC
Saturation Ratios: 41 % (ref 20–55)
UIBC: 136 ug/dL (ref 125–400)

## 2012-09-30 LAB — RENAL FUNCTION PANEL
CO2: 16 mEq/L — ABNORMAL LOW (ref 19–32)
Calcium: 9.2 mg/dL (ref 8.4–10.5)
Creatinine, Ser: 10.93 mg/dL — ABNORMAL HIGH (ref 0.50–1.35)
GFR calc non Af Amer: 4 mL/min — ABNORMAL LOW (ref >90)

## 2012-09-30 MED ORDER — EPOETIN ALFA 20000 UNIT/ML IJ SOLN
20000.0000 [IU] | INTRAMUSCULAR | Status: DC
  Administered 2012-09-30: 20000 [IU] via SUBCUTANEOUS

## 2012-09-30 MED ORDER — EPOETIN ALFA 20000 UNIT/ML IJ SOLN
INTRAMUSCULAR | Status: AC
  Filled 2012-09-30: qty 1

## 2012-10-13 ENCOUNTER — Encounter: Payer: Self-pay | Admitting: Internal Medicine

## 2012-10-21 ENCOUNTER — Encounter (HOSPITAL_COMMUNITY): Payer: Medicaid Other

## 2012-10-27 ENCOUNTER — Encounter (HOSPITAL_COMMUNITY)
Admission: RE | Admit: 2012-10-27 | Discharge: 2012-10-27 | Disposition: A | Discharge status: Home / Self Care | Payer: Medicaid Other | Source: Ambulatory Visit | Attending: Nephrology | Admitting: Nephrology

## 2012-10-27 DIAGNOSIS — D638 Anemia in other chronic diseases classified elsewhere: Secondary | ICD-10-CM | POA: Insufficient documentation

## 2012-10-27 DIAGNOSIS — N184 Chronic kidney disease, stage 4 (severe): Secondary | ICD-10-CM | POA: Insufficient documentation

## 2012-10-27 LAB — RENAL FUNCTION PANEL
CO2: 22 mEq/L (ref 19–32)
Calcium: 9 mg/dL (ref 8.4–10.5)
GFR calc Af Amer: 3 mL/min — ABNORMAL LOW (ref >90)
GFR calc non Af Amer: 3 mL/min — ABNORMAL LOW (ref >90)
Phosphorus: 9.1 mg/dL — ABNORMAL HIGH (ref 2.3–4.6)
Sodium: 142 mEq/L (ref 135–145)

## 2012-10-27 LAB — FERRITIN: Ferritin: 499 ng/mL — ABNORMAL HIGH (ref 22–322)

## 2012-10-27 LAB — POCT HEMOGLOBIN-HEMACUE: Hemoglobin: 9 g/dL — ABNORMAL LOW (ref 13.0–17.0)

## 2012-10-27 LAB — IRON AND TIBC
Iron: 122 ug/dL (ref 42–135)
TIBC: 266 ug/dL (ref 215–435)

## 2012-10-27 MED ORDER — EPOETIN ALFA 20000 UNIT/ML IJ SOLN
INTRAMUSCULAR | Status: AC
  Administered 2012-10-27: 20000 [IU] via SUBCUTANEOUS
  Filled 2012-10-27: qty 1

## 2012-10-27 MED ORDER — EPOETIN ALFA 20000 UNIT/ML IJ SOLN: 20000.0000 [IU] | INTRAMUSCULAR | Status: DC

## 2012-10-28 LAB — PTH, INTACT AND CALCIUM
Calcium, Total (PTH): 8.8 mg/dL (ref 8.4–10.5)
PTH: 1562.2 pg/mL — ABNORMAL HIGH (ref 14.0–72.0)

## 2012-11-16 ENCOUNTER — Other Ambulatory Visit (HOSPITAL_COMMUNITY): Payer: Self-pay | Admitting: *Deleted

## 2012-11-17 ENCOUNTER — Encounter (HOSPITAL_COMMUNITY)
Admission: RE | Admit: 2012-11-17 | Discharge: 2012-11-17 | Disposition: A | Discharge status: Home / Self Care | Payer: Medicaid Other | Source: Ambulatory Visit | Attending: Nephrology | Admitting: Nephrology

## 2012-11-17 LAB — POCT HEMOGLOBIN-HEMACUE: Hemoglobin: 8 g/dL — ABNORMAL LOW (ref 13.0–17.0)

## 2012-11-17 MED ORDER — EPOETIN ALFA 20000 UNIT/ML IJ SOLN
20000.0000 [IU] | INTRAMUSCULAR | Status: DC
  Administered 2012-11-17: 20000 [IU] via SUBCUTANEOUS

## 2012-11-17 MED ORDER — EPOETIN ALFA 20000 UNIT/ML IJ SOLN
INTRAMUSCULAR | Status: AC
  Administered 2012-11-17: 20000 [IU] via SUBCUTANEOUS
  Filled 2012-11-17: qty 1

## 2012-12-07 ENCOUNTER — Encounter (HOSPITAL_COMMUNITY)
Admission: RE | Admit: 2012-12-07 | Discharge: 2012-12-07 | Disposition: A | Discharge status: Home / Self Care | Payer: Medicaid Other | Source: Ambulatory Visit | Attending: Nephrology | Admitting: Nephrology

## 2012-12-07 DIAGNOSIS — D638 Anemia in other chronic diseases classified elsewhere: Secondary | ICD-10-CM | POA: Insufficient documentation

## 2012-12-07 DIAGNOSIS — N184 Chronic kidney disease, stage 4 (severe): Secondary | ICD-10-CM | POA: Insufficient documentation

## 2012-12-07 LAB — RENAL FUNCTION PANEL
Albumin: 3.9 g/dL (ref 3.5–5.2)
CO2: 17 mEq/L — ABNORMAL LOW (ref 19–32)
Calcium: 9.4 mg/dL (ref 8.4–10.5)
Chloride: 100 mEq/L (ref 96–112)
GFR calc Af Amer: 3 mL/min — ABNORMAL LOW (ref >90)
GFR calc non Af Amer: 3 mL/min — ABNORMAL LOW (ref >90)
Sodium: 140 mEq/L (ref 135–145)

## 2012-12-07 LAB — IRON AND TIBC: Saturation Ratios: 67 % — ABNORMAL HIGH (ref 20–55)

## 2012-12-07 MED ORDER — EPOETIN ALFA 20000 UNIT/ML IJ SOLN
20000.0000 [IU] | INTRAMUSCULAR | Status: DC
  Administered 2012-12-07: 20000 [IU] via SUBCUTANEOUS

## 2012-12-07 MED ORDER — EPOETIN ALFA 20000 UNIT/ML IJ SOLN
INTRAMUSCULAR | Status: AC
  Filled 2012-12-07: qty 1

## 2012-12-08 ENCOUNTER — Encounter (HOSPITAL_COMMUNITY): Payer: Medicaid Other

## 2012-12-08 LAB — PTH, INTACT AND CALCIUM: Calcium, Total (PTH): 9 mg/dL (ref 8.4–10.5)

## 2012-12-21 ENCOUNTER — Encounter (HOSPITAL_COMMUNITY)
Admission: RE | Admit: 2012-12-21 | Discharge: 2012-12-21 | Disposition: A | Discharge status: Home / Self Care | Payer: Medicaid Other | Source: Ambulatory Visit | Attending: Nephrology | Admitting: Nephrology

## 2012-12-21 LAB — POCT HEMOGLOBIN-HEMACUE: Hemoglobin: 8 g/dL — ABNORMAL LOW (ref 13.0–17.0)

## 2012-12-21 MED ORDER — EPOETIN ALFA 20000 UNIT/ML IJ SOLN
INTRAMUSCULAR | Status: AC
  Administered 2012-12-21: 20000 [IU] via SUBCUTANEOUS
  Filled 2012-12-21: qty 1

## 2012-12-21 MED ORDER — EPOETIN ALFA 20000 UNIT/ML IJ SOLN
20000.0000 [IU] | INTRAMUSCULAR | Status: DC
  Administered 2012-12-21: 20000 [IU] via SUBCUTANEOUS

## 2013-01-04 ENCOUNTER — Encounter (HOSPITAL_COMMUNITY)
Admission: RE | Admit: 2013-01-04 | Discharge: 2013-01-04 | Disposition: A | Discharge status: Home / Self Care | Payer: Medicaid Other | Source: Ambulatory Visit | Attending: Nephrology | Admitting: Nephrology

## 2013-01-04 DIAGNOSIS — N184 Chronic kidney disease, stage 4 (severe): Secondary | ICD-10-CM | POA: Insufficient documentation

## 2013-01-04 DIAGNOSIS — D638 Anemia in other chronic diseases classified elsewhere: Secondary | ICD-10-CM | POA: Insufficient documentation

## 2013-01-04 LAB — RENAL FUNCTION PANEL
Albumin: 3.9 g/dL (ref 3.5–5.2)
Calcium: 9.3 mg/dL (ref 8.4–10.5)
Chloride: 98 mEq/L (ref 96–112)
Creatinine, Ser: 14.66 mg/dL — ABNORMAL HIGH (ref 0.50–1.35)
GFR calc non Af Amer: 3 mL/min — ABNORMAL LOW (ref >90)
Sodium: 137 mEq/L (ref 135–145)

## 2013-01-04 LAB — POCT HEMOGLOBIN-HEMACUE: Hemoglobin: 8.9 g/dL — ABNORMAL LOW (ref 13.0–17.0)

## 2013-01-04 LAB — IRON AND TIBC: Iron: 97 ug/dL (ref 42–135)

## 2013-01-04 MED ORDER — EPOETIN ALFA 20000 UNIT/ML IJ SOLN: 20000.0000 [IU] | INTRAMUSCULAR | Status: DC

## 2013-01-04 MED ORDER — EPOETIN ALFA 20000 UNIT/ML IJ SOLN
INTRAMUSCULAR | Status: AC
  Administered 2013-01-04: 20000 [IU]
  Filled 2013-01-04: qty 1

## 2013-01-05 LAB — PTH, INTACT AND CALCIUM
Calcium, Total (PTH): 9.3 mg/dL (ref 8.4–10.5)
PTH: 1847 pg/mL — ABNORMAL HIGH (ref 14.0–72.0)

## 2013-01-18 ENCOUNTER — Inpatient Hospital Stay (HOSPITAL_COMMUNITY): Admission: RE | Admit: 2013-01-18 | Payer: Medicaid Other | Source: Ambulatory Visit

## 2013-02-01 ENCOUNTER — Encounter (HOSPITAL_COMMUNITY)
Admission: RE | Admit: 2013-02-01 | Discharge: 2013-02-01 | Disposition: A | Discharge status: Home / Self Care | Payer: Medicaid Other | Source: Ambulatory Visit | Attending: Nephrology | Admitting: Nephrology

## 2013-02-01 DIAGNOSIS — N184 Chronic kidney disease, stage 4 (severe): Secondary | ICD-10-CM | POA: Insufficient documentation

## 2013-02-01 DIAGNOSIS — D638 Anemia in other chronic diseases classified elsewhere: Secondary | ICD-10-CM | POA: Insufficient documentation

## 2013-02-01 LAB — IRON AND TIBC
Iron: 110 ug/dL (ref 42–135)
SATURATION RATIOS: 42 % (ref 20–55)
TIBC: 263 ug/dL (ref 215–435)
UIBC: 153 ug/dL (ref 125–400)

## 2013-02-01 LAB — RENAL FUNCTION PANEL
Albumin: 4.1 g/dL (ref 3.5–5.2)
BUN: 144 mg/dL — AB (ref 6–23)
CALCIUM: 9.7 mg/dL (ref 8.4–10.5)
CHLORIDE: 90 meq/L — AB (ref 96–112)
CO2: 21 meq/L (ref 19–32)
Creatinine, Ser: 20.64 mg/dL — ABNORMAL HIGH (ref 0.50–1.35)
GFR calc Af Amer: 2 mL/min — ABNORMAL LOW (ref >90)
GFR calc non Af Amer: 2 mL/min — ABNORMAL LOW (ref >90)
GLUCOSE: 193 mg/dL — AB (ref 70–99)
Phosphorus: 12.2 mg/dL — ABNORMAL HIGH (ref 2.3–4.6)
Potassium: 4.4 mEq/L (ref 3.7–5.3)
Sodium: 139 mEq/L (ref 137–147)

## 2013-02-01 LAB — POCT HEMOGLOBIN-HEMACUE: Hemoglobin: 9.1 g/dL — ABNORMAL LOW (ref 13.0–17.0)

## 2013-02-01 LAB — FERRITIN: Ferritin: 436 ng/mL — ABNORMAL HIGH (ref 22–322)

## 2013-02-01 MED ORDER — EPOETIN ALFA 20000 UNIT/ML IJ SOLN
INTRAMUSCULAR | Status: AC
  Administered 2013-02-01: 08:00:00 20000 [IU] via SUBCUTANEOUS
  Filled 2013-02-01: qty 1

## 2013-02-01 MED ORDER — EPOETIN ALFA 20000 UNIT/ML IJ SOLN
20000.0000 [IU] | INTRAMUSCULAR | Status: DC
  Administered 2013-02-01: 20000 [IU] via SUBCUTANEOUS

## 2013-02-02 ENCOUNTER — Other Ambulatory Visit: Payer: Self-pay | Admitting: *Deleted

## 2013-02-02 DIAGNOSIS — N186 End stage renal disease: Secondary | ICD-10-CM

## 2013-02-02 DIAGNOSIS — Z01810 Encounter for preprocedural cardiovascular examination: Secondary | ICD-10-CM

## 2013-02-02 LAB — PTH, INTACT AND CALCIUM
Calcium, Total (PTH): 9.5 mg/dL (ref 8.4–10.5)
PTH: 2311 pg/mL — ABNORMAL HIGH (ref 14.0–72.0)

## 2013-02-03 ENCOUNTER — Encounter: Payer: Self-pay | Admitting: Vascular Surgery

## 2013-02-04 ENCOUNTER — Encounter (HOSPITAL_COMMUNITY): Payer: Self-pay | Admitting: *Deleted

## 2013-02-04 ENCOUNTER — Other Ambulatory Visit (HOSPITAL_COMMUNITY): Payer: Self-pay | Admitting: *Deleted

## 2013-02-04 ENCOUNTER — Encounter: Payer: Self-pay | Admitting: Vascular Surgery

## 2013-02-04 ENCOUNTER — Ambulatory Visit (INDEPENDENT_AMBULATORY_CARE_PROVIDER_SITE_OTHER)
Admission: RE | Admit: 2013-02-04 | Discharge: 2013-02-04 | Disposition: A | Discharge status: Home / Self Care | Payer: Medicaid Other | Source: Ambulatory Visit | Attending: Vascular Surgery | Admitting: Vascular Surgery

## 2013-02-04 ENCOUNTER — Ambulatory Visit (HOSPITAL_COMMUNITY)
Admission: RE | Admit: 2013-02-04 | Discharge: 2013-02-04 | Disposition: A | Discharge status: Home / Self Care | Payer: Medicaid Other | Source: Ambulatory Visit | Attending: Vascular Surgery | Admitting: Vascular Surgery

## 2013-02-04 ENCOUNTER — Ambulatory Visit (INDEPENDENT_AMBULATORY_CARE_PROVIDER_SITE_OTHER): Payer: Self-pay | Admitting: Vascular Surgery

## 2013-02-04 ENCOUNTER — Other Ambulatory Visit: Payer: Self-pay

## 2013-02-04 VITALS — BP 166/120 | HR 88 | Resp 18 | Ht 74.0 in | Wt 185.0 lb

## 2013-02-04 DIAGNOSIS — N186 End stage renal disease: Secondary | ICD-10-CM

## 2013-02-04 DIAGNOSIS — Z01810 Encounter for preprocedural cardiovascular examination: Secondary | ICD-10-CM

## 2013-02-04 NOTE — Progress Notes (Signed)
Subjective:     Patient ID: Alexander Grant, male   DOB: 08/23/1955, 57 y.o.   MRN: [id number]  HPI this 57-year-old male was referred by Dr. Kellie  [place] for vascular access. Patient has glomerulonephritis. He will need hemodialysis soon. He is right-handed.  Past Medical History  Diagnosis Date  . Kidney stone   . Fournier gangrene     with peri-rectal/perineal abscess drainage  . PONV (postoperative nausea and vomiting)   . Wegener's granulomatosis with renal involvement   . Anemia   . History of blood transfusion 08/2011  . Lower GI bleeding 10/01/2011  . Cocaine abuse 2013  . Tubular adenoma of colon   . Hyponatremia   . Acute renal failure   . Pulmonary infiltrate   . Reflux esophagitis   . Diverticulosis   . Right thyroid nodule   . Cardiomegaly     History  Substance Use Topics  . Smoking status: Never Smoker   . Smokeless tobacco: Never Used  . Alcohol Use: Yes     Comment: 10/01/2011 "last alcohol ~ 2012"    Family History  Problem Relation Age of Onset  . Stroke Mother   . Cancer Father     Allergies  Allergen Reactions  . Bee Pollen Anaphylaxis, Shortness Of Breath and Rash  . Percocet [Oxycodone-Acetaminophen] Hives and Rash    Current outpatient prescriptions:Diphenoxylate-Atropine (LOMOTIL PO), Take 3 tablets by mouth 2 (two) times daily., Disp: , Rfl: ;  amLODipine (NORVASC) 5 MG tablet, Take 1 tablet (5 mg total) by mouth daily., Disp: 30 tablet, Rfl: 0;  furosemide (LASIX) 40 MG tablet, Take 1 tablet (40 mg total) by mouth 2 (two) times daily., Disp: 30 tablet, Rfl: 0 metoprolol tartrate (LOPRESSOR) 25 MG tablet, Take 1 tablet (25 mg total) by mouth 2 (two) times daily., Disp: 60 tablet, Rfl: 0;  omeprazole (PRILOSEC) 40 MG capsule, Take 1 capsule (40 mg total) by mouth 2 (two) times daily., Disp: 60 capsule, Rfl: 0;  predniSONE (DELTASONE) 20 MG tablet, Take 20 mg by mouth daily. , Disp: , Rfl:   BP 166/120  Pulse 88  Resp 18  Ht 6' 2" (1.88 m)   Wt 185 lb (83.915 kg)  BMI 23.74 kg/m2  Body mass index is 23.74 kg/(m^2).          Review of Systems denies chest pain, , PND, orthopnea, hemoptysis, claudication.-Other systems negative and a complete review of systems other than dyspnea on exertion and occasional edema    Objective:   Physical Exam BP 166/120  Pulse 88  Resp 18  Ht 6' 2" (1.88 m)  Wt 185 lb (83.915 kg)  BMI 23.74 kg/m2  Gen.-alert and oriented x3 in no apparent distress HEENT normal for age Lungs no rhonchi or wheezing Cardiovascular regular rhythm no murmurs carotid pulses 3+ palpable no bruits audible Abdomen soft nontender no palpable masses Musculoskeletal free of  major deformities Skin clear -no rashes Neurologic normal Lower extremities 3+ femoral and dorsalis pedis pulses palpable bilaterally with no edema Left upper extremity cephalic vein appears adequate on physical exam for AV fistula  Today I ordered bilateral upper extremity vein mapping which I reviewed and interpreted. Also ordered an arterial study. Patient has excellent arterial supply the upper trimmings. Both cephalic veins in the upper arms or marginal and basilic veins appeared to be marginal but adequate left bigger than right      Assessment:     Chronic renal insufficiency-stage IV-needs vascular access and hemodialysis   catheter at-ASAP Appears to have adequate left cephalic vein for fistula creation and upper arm    Plan:     Plan left brachial to cephalic AV fistula and insertion hemodialysis catheter by Dr. Oneida Alar on Monday, January 12 Patient will then notify Dr. Clover Mealy to see when hemodialysis session will be scheduled

## 2013-02-06 MED ORDER — DEXTROSE 5 % IV SOLN
1.5000 g | INTRAVENOUS | Status: AC
  Administered 2013-02-07: 1.5 g via INTRAVENOUS
  Filled 2013-02-06: qty 1.5

## 2013-02-06 MED ORDER — SODIUM CHLORIDE 0.9 % IV SOLN
INTRAVENOUS | Status: DC
  Administered 2013-02-07: 10:00:00 via INTRAVENOUS

## 2013-02-07 ENCOUNTER — Ambulatory Visit (HOSPITAL_COMMUNITY): Payer: Medicaid Other | Admitting: Certified Registered Nurse Anesthetist

## 2013-02-07 ENCOUNTER — Encounter (HOSPITAL_COMMUNITY): Payer: Self-pay | Admitting: *Deleted

## 2013-02-07 ENCOUNTER — Ambulatory Visit (HOSPITAL_COMMUNITY)
Admission: RE | Admit: 2013-02-07 | Discharge: 2013-02-07 | Disposition: A | Discharge status: Home / Self Care | Payer: Medicaid Other | Source: Ambulatory Visit | Attending: Vascular Surgery | Admitting: Vascular Surgery

## 2013-02-07 ENCOUNTER — Encounter (HOSPITAL_COMMUNITY): Payer: Medicaid Other | Admitting: Certified Registered Nurse Anesthetist

## 2013-02-07 ENCOUNTER — Ambulatory Visit (HOSPITAL_COMMUNITY): Payer: Medicaid Other

## 2013-02-07 ENCOUNTER — Encounter (HOSPITAL_COMMUNITY)
Admission: RE | Disposition: A | Discharge status: Home / Self Care | Payer: Self-pay | Source: Ambulatory Visit | Attending: Vascular Surgery

## 2013-02-07 DIAGNOSIS — N184 Chronic kidney disease, stage 4 (severe): Secondary | ICD-10-CM | POA: Insufficient documentation

## 2013-02-07 DIAGNOSIS — N186 End stage renal disease: Secondary | ICD-10-CM

## 2013-02-07 DIAGNOSIS — M3130 Wegener's granulomatosis without renal involvement: Secondary | ICD-10-CM | POA: Insufficient documentation

## 2013-02-07 DIAGNOSIS — I129 Hypertensive chronic kidney disease with stage 1 through stage 4 chronic kidney disease, or unspecified chronic kidney disease: Secondary | ICD-10-CM | POA: Insufficient documentation

## 2013-02-07 DIAGNOSIS — N058 Unspecified nephritic syndrome with other morphologic changes: Secondary | ICD-10-CM | POA: Insufficient documentation

## 2013-02-07 HISTORY — DX: Essential (primary) hypertension: I10

## 2013-02-07 HISTORY — PX: INSERTION OF DIALYSIS CATHETER: SHX1324

## 2013-02-07 HISTORY — PX: AV FISTULA PLACEMENT: SHX1204

## 2013-02-07 LAB — POCT I-STAT 4, (NA,K, GLUC, HGB,HCT)
GLUCOSE: 109 mg/dL — AB (ref 70–99)
HCT: 27 % — ABNORMAL LOW (ref 39.0–52.0)
Hemoglobin: 9.2 g/dL — ABNORMAL LOW (ref 13.0–17.0)
Potassium: 5.6 mEq/L — ABNORMAL HIGH (ref 3.7–5.3)
SODIUM: 136 meq/L — AB (ref 137–147)

## 2013-02-07 SURGERY — ARTERIOVENOUS (AV) FISTULA CREATION
Anesthesia: Monitor Anesthesia Care | Site: Neck

## 2013-02-07 MED ORDER — SODIUM CHLORIDE 0.9 % IR SOLN
Status: DC | PRN
  Administered 2013-02-07: 12:00:00

## 2013-02-07 MED ORDER — LIDOCAINE HCL (CARDIAC) 20 MG/ML IV SOLN
INTRAVENOUS | Status: DC | PRN
  Administered 2013-02-07: 50 mg via INTRAVENOUS

## 2013-02-07 MED ORDER — HEPARIN SODIUM (PORCINE) 1000 UNIT/ML IJ SOLN
INTRAMUSCULAR | Status: DC | PRN
  Administered 2013-02-07: 5 mL

## 2013-02-07 MED ORDER — MIDAZOLAM HCL 5 MG/5ML IJ SOLN
INTRAMUSCULAR | Status: DC | PRN
  Administered 2013-02-07 (×2): 2 mg via INTRAVENOUS

## 2013-02-07 MED ORDER — HEPARIN SODIUM (PORCINE) 1000 UNIT/ML IJ SOLN
INTRAMUSCULAR | Status: AC
  Filled 2013-02-07: qty 1

## 2013-02-07 MED ORDER — LIDOCAINE-EPINEPHRINE 0.5 %-1:200000 IJ SOLN
INTRAMUSCULAR | Status: AC
  Filled 2013-02-07: qty 1

## 2013-02-07 MED ORDER — OXYCODONE-ACETAMINOPHEN 5-325 MG PO TABS: 1.0000 | ORAL_TABLET | Freq: Four times a day (QID) | ORAL | Status: DC | PRN

## 2013-02-07 MED ORDER — 0.9 % SODIUM CHLORIDE (POUR BTL) OPTIME
TOPICAL | Status: DC | PRN
Start: 2013-02-07 — End: 2013-02-07
  Administered 2013-02-07: 1000 mL

## 2013-02-07 MED ORDER — PROPOFOL INFUSION 10 MG/ML OPTIME
INTRAVENOUS | Status: DC | PRN
  Administered 2013-02-07: 50 ug/kg/min via INTRAVENOUS

## 2013-02-07 MED ORDER — LIDOCAINE-EPINEPHRINE (PF) 1 %-1:200000 IJ SOLN: INTRAMUSCULAR | Status: DC | PRN

## 2013-02-07 MED ORDER — HYDROMORPHONE HCL PF 1 MG/ML IJ SOLN: 0.2500 mg | INTRAMUSCULAR | Status: DC | PRN

## 2013-02-07 MED ORDER — LIDOCAINE-EPINEPHRINE 0.5 %-1:200000 IJ SOLN
INTRAMUSCULAR | Status: DC | PRN
  Administered 2013-02-07: 14 mL

## 2013-02-07 MED ORDER — FENTANYL CITRATE 0.05 MG/ML IJ SOLN
INTRAMUSCULAR | Status: DC | PRN
  Administered 2013-02-07: 25 ug via INTRAVENOUS
  Administered 2013-02-07: 50 ug via INTRAVENOUS
  Administered 2013-02-07 (×3): 25 ug via INTRAVENOUS

## 2013-02-07 MED ORDER — HYDROCODONE-ACETAMINOPHEN 5-325 MG PO TABS: 1.0000 | ORAL_TABLET | Freq: Four times a day (QID) | ORAL | Status: DC | PRN

## 2013-02-07 SURGICAL SUPPLY — 77 items
ADH SKN CLS APL DERMABOND .7 (GAUZE/BANDAGES/DRESSINGS) ×2
APL SKNCLS STERI-STRIP NONHPOA (GAUZE/BANDAGES/DRESSINGS) ×2
ARMBAND PINK RESTRICT EXTREMIT (MISCELLANEOUS) ×4 IMPLANT
BAG BANDED W/RUBBER/TAPE 36X54 (MISCELLANEOUS) ×2 IMPLANT
BAG DECANTER FOR FLEXI CONT (MISCELLANEOUS) ×4 IMPLANT
BAG EQP BAND 135X91 W/RBR TAPE (MISCELLANEOUS) ×2
BENZOIN TINCTURE PRP APPL 2/3 (GAUZE/BANDAGES/DRESSINGS) ×2 IMPLANT
CANISTER SUCTION 2500CC (MISCELLANEOUS) ×4 IMPLANT
CATH CANNON HEMO 15F 50CM (CATHETERS) IMPLANT
CATH CANNON HEMO 15FR 19 (HEMODIALYSIS SUPPLIES) IMPLANT
CATH CANNON HEMO 15FR 23CM (HEMODIALYSIS SUPPLIES) IMPLANT
CATH CANNON HEMO 15FR 31CM (HEMODIALYSIS SUPPLIES) IMPLANT
CATH CANNON HEMO 15FR 32 (HEMODIALYSIS SUPPLIES) IMPLANT
CATH CANNON HEMO 15FR 32CM (HEMODIALYSIS SUPPLIES) ×4 IMPLANT
CATH STRAIGHT 5FR 65CM (CATHETERS) IMPLANT
CHLORAPREP W/TINT 26ML (MISCELLANEOUS) ×4 IMPLANT
CLIP TI MEDIUM 6 (CLIP) ×4 IMPLANT
CLIP TI WIDE RED SMALL 6 (CLIP) ×4 IMPLANT
CLOSURE STERI-STRIP 1/2X4 (GAUZE/BANDAGES/DRESSINGS) ×1
CLSR STERI-STRIP ANTIMIC 1/2X4 (GAUZE/BANDAGES/DRESSINGS) ×1 IMPLANT
COVER DOME SNAP 22 D (MISCELLANEOUS) ×2 IMPLANT
COVER PROBE W GEL 5X96 (DRAPES) ×4 IMPLANT
COVER SURGICAL LIGHT HANDLE (MISCELLANEOUS) ×4 IMPLANT
COVER TRANSDUCER ULTRASND GEL (DRAPE) ×2 IMPLANT
DECANTER SPIKE VIAL GLASS SM (MISCELLANEOUS) ×2 IMPLANT
DERMABOND ADVANCED (GAUZE/BANDAGES/DRESSINGS) ×2
DERMABOND ADVANCED .7 DNX12 (GAUZE/BANDAGES/DRESSINGS) ×2 IMPLANT
DRAIN PENROSE 1/4X12 LTX STRL (WOUND CARE) ×2 IMPLANT
DRAPE C-ARM 42X72 X-RAY (DRAPES) ×4 IMPLANT
DRAPE CHEST BREAST 15X10 FENES (DRAPES) ×4 IMPLANT
ELECT REM PT RETURN 9FT ADLT (ELECTROSURGICAL) ×4
ELECTRODE REM PT RTRN 9FT ADLT (ELECTROSURGICAL) ×2 IMPLANT
GAUZE SPONGE 2X2 8PLY STRL LF (GAUZE/BANDAGES/DRESSINGS) ×2 IMPLANT
GAUZE SPONGE 4X4 16PLY XRAY LF (GAUZE/BANDAGES/DRESSINGS) ×4 IMPLANT
GEL ULTRASOUND 20GR AQUASONIC (MISCELLANEOUS) IMPLANT
GLOVE BIO SURGEON STRL SZ7 (GLOVE) ×6 IMPLANT
GLOVE BIO SURGEON STRL SZ7.5 (GLOVE) ×4 IMPLANT
GLOVE BIOGEL PI IND STRL 7.5 (GLOVE) IMPLANT
GLOVE BIOGEL PI INDICATOR 7.5 (GLOVE) ×4
GLOVE ECLIPSE 7.5 STRL STRAW (GLOVE) ×2 IMPLANT
GLOVE SS BIOGEL STRL SZ 7 (GLOVE) IMPLANT
GLOVE SUPERSENSE BIOGEL SZ 7 (GLOVE) ×2
GOWN STRL REUS W/ TWL LRG LVL3 (GOWN DISPOSABLE) ×6 IMPLANT
GOWN STRL REUS W/TWL LRG LVL3 (GOWN DISPOSABLE) ×12
KIT BASIN OR (CUSTOM PROCEDURE TRAY) ×4 IMPLANT
KIT ROOM TURNOVER OR (KITS) ×4 IMPLANT
LOOP VESSEL MINI RED (MISCELLANEOUS) IMPLANT
NDL 18GX1X1/2 (RX/OR ONLY) (NEEDLE) ×2 IMPLANT
NDL HYPO 25GX1X1/2 BEV (NEEDLE) ×2 IMPLANT
NEEDLE 18GX1X1/2 (RX/OR ONLY) (NEEDLE) ×4 IMPLANT
NEEDLE 22X1 1/2 (OR ONLY) (NEEDLE) ×4 IMPLANT
NEEDLE HYPO 25GX1X1/2 BEV (NEEDLE) ×4 IMPLANT
NS IRRIG 1000ML POUR BTL (IV SOLUTION) ×4 IMPLANT
PACK CV ACCESS (CUSTOM PROCEDURE TRAY) ×4 IMPLANT
PACK SURGICAL SETUP 50X90 (CUSTOM PROCEDURE TRAY) ×2 IMPLANT
PAD ARMBOARD 7.5X6 YLW CONV (MISCELLANEOUS) ×8 IMPLANT
SET MICROPUNCTURE 5F STIFF (MISCELLANEOUS) IMPLANT
SPONGE GAUZE 2X2 STER 10/PKG (GAUZE/BANDAGES/DRESSINGS) ×2
SPONGE GAUZE 4X4 12PLY (GAUZE/BANDAGES/DRESSINGS) ×2 IMPLANT
SPONGE SURGIFOAM ABS GEL 100 (HEMOSTASIS) IMPLANT
SUT ETHILON 3 0 PS 1 (SUTURE) ×4 IMPLANT
SUT PROLENE 6 0 CC (SUTURE) ×2 IMPLANT
SUT PROLENE 7 0 BV 1 (SUTURE) ×6 IMPLANT
SUT VIC AB 3-0 SH 27 (SUTURE) ×4
SUT VIC AB 3-0 SH 27X BRD (SUTURE) ×2 IMPLANT
SUT VICRYL 4-0 PS2 18IN ABS (SUTURE) ×4 IMPLANT
SYR 20CC LL (SYRINGE) ×8 IMPLANT
SYR 30ML LL (SYRINGE) IMPLANT
SYR 5ML LL (SYRINGE) ×8 IMPLANT
SYR CONTROL 10ML LL (SYRINGE) ×4 IMPLANT
SYRINGE 10CC LL (SYRINGE) ×4 IMPLANT
TAPE CLOTH SURG 4X10 WHT LF (GAUZE/BANDAGES/DRESSINGS) ×4 IMPLANT
TOWEL OR 17X24 6PK STRL BLUE (TOWEL DISPOSABLE) ×4 IMPLANT
TOWEL OR 17X26 10 PK STRL BLUE (TOWEL DISPOSABLE) ×4 IMPLANT
UNDERPAD 30X30 INCONTINENT (UNDERPADS AND DIAPERS) ×4 IMPLANT
WATER STERILE IRR 1000ML POUR (IV SOLUTION) ×4 IMPLANT
WIRE AMPLATZ SS-J .035X180CM (WIRE) IMPLANT

## 2013-02-07 NOTE — Transfer of Care (Signed)
Immediate Anesthesia Transfer of Care Note  Patient: Alexander Grant  Procedure(s) Performed: Procedure(s): ARTERIOVENOUS (AV) FISTULA CREATION- LEFT BRACHIOCEPHALIC  (Left) INSERTION OF DIALYSIS CATHETER (N/A)  Patient Location: PACU  Anesthesia Type:MAC  Level of Consciousness: awake, alert , oriented and patient cooperative  Airway & Oxygen Therapy: Patient Spontanous Breathing and Patient connected to nasal cannula oxygen  Post-op Assessment: Report given to PACU RN and Post -op Vital signs reviewed and stable  Post vital signs: Reviewed and stable  Complications: No apparent anesthesia complications

## 2013-02-07 NOTE — Anesthesia Preprocedure Evaluation (Signed)
Anesthesia Evaluation  Patient identified by MRN, date of birth, ID band Patient awake    Reviewed: Allergy & Precautions, H&P , NPO status , Patient's Chart, lab work & pertinent test results  History of Anesthesia Complications (+) PONV and history of anesthetic complications  Airway Mallampati: II TM Distance: >3 FB Neck ROM: Full    Dental no notable dental hx. (+) Teeth Intact and Dental Advisory Given   Pulmonary neg pulmonary ROS,  breath sounds clear to auscultation  Pulmonary exam normal       Cardiovascular hypertension, On Medications Rhythm:Regular Rate:Normal     Neuro/Psych negative neurological ROS  negative psych ROS   GI/Hepatic negative GI ROS, Neg liver ROS,   Endo/Other  negative endocrine ROS  Renal/GU ESRFRenal disease  negative genitourinary   Musculoskeletal   Abdominal   Peds  Hematology negative hematology ROS (+)   Anesthesia Other Findings   Reproductive/Obstetrics negative OB ROS                           Anesthesia Physical Anesthesia Plan  ASA: III  Anesthesia Plan: MAC   Post-op Pain Management:    Induction: Intravenous  Airway Management Planned: Simple Face Mask  Additional Equipment:   Intra-op Plan:   Post-operative Plan:   Informed Consent: I have reviewed the patients History and Physical, chart, labs and discussed the procedure including the risks, benefits and alternatives for the proposed anesthesia with the patient or authorized representative who has indicated his/her understanding and acceptance.   Dental advisory given  Plan Discussed with: CRNA  Anesthesia Plan Comments:         Anesthesia Quick Evaluation

## 2013-02-07 NOTE — H&P (View-Only) (Signed)
Subjective:     Patient ID: Alexander Grant, male   DOB: [birth year], 58 y.o.   MRN: [id number]  HPI this 58 year old male was referred by Dr. Judie Grieve for vascular access. Patient has glomerulonephritis. He will need hemodialysis soon. He is right-handed.  Past Medical History  Diagnosis Date  . Kidney stone   . Fournier gangrene     with peri-rectal/perineal abscess drainage  . PONV (postoperative nausea and vomiting)   . Wegener's granulomatosis with renal involvement   . Anemia   . History of blood transfusion 08/2011  . Lower GI bleeding 10/01/2011  . Cocaine abuse 2013  . Tubular adenoma of colon   . Hyponatremia   . Acute renal failure   . Pulmonary infiltrate   . Reflux esophagitis   . Diverticulosis   . Right thyroid nodule   . Cardiomegaly     History  Substance Use Topics  . Smoking status: Never Smoker   . Smokeless tobacco: Never Used  . Alcohol Use: Yes     Comment: 10/01/2011 "last alcohol ~ 2012"    Family History  Problem Relation Age of Onset  . Stroke Mother   . Cancer Father     Allergies  Allergen Reactions  . Bee Pollen Anaphylaxis, Shortness Of Breath and Rash  . Percocet [Oxycodone-Acetaminophen] Hives and Rash    Current outpatient prescriptions:Diphenoxylate-Atropine (LOMOTIL PO), Take 3 tablets by mouth 2 (two) times daily., Disp: , Rfl: ;  amLODipine (NORVASC) 5 MG tablet, Take 1 tablet (5 mg total) by mouth daily., Disp: 30 tablet, Rfl: 0;  furosemide (LASIX) 40 MG tablet, Take 1 tablet (40 mg total) by mouth 2 (two) times daily., Disp: 30 tablet, Rfl: 0 metoprolol tartrate (LOPRESSOR) 25 MG tablet, Take 1 tablet (25 mg total) by mouth 2 (two) times daily., Disp: 60 tablet, Rfl: 0;  omeprazole (PRILOSEC) 40 MG capsule, Take 1 capsule (40 mg total) by mouth 2 (two) times daily., Disp: 60 capsule, Rfl: 0;  predniSONE (DELTASONE) 20 MG tablet, Take 20 mg by mouth daily. , Disp: , Rfl:   BP 166/120  Pulse 88  Resp 18  Ht 6\' 2"  (1.88 m)   Wt 185 lb (83.915 kg)  BMI 23.74 kg/m2  Body mass index is 23.74 kg/(m^2).          Review of Systems denies chest pain, , PND, orthopnea, hemoptysis, claudication.-Other systems negative and a complete review of systems other than dyspnea on exertion and occasional edema    Objective:   Physical Exam BP 166/120  Pulse 88  Resp 18  Ht 6\' 2"  (1.88 m)  Wt 185 lb (83.915 kg)  BMI 23.74 kg/m2  Gen.-alert and oriented x3 in no apparent distress HEENT normal for age Lungs no rhonchi or wheezing Cardiovascular regular rhythm no murmurs carotid pulses 3+ palpable no bruits audible Abdomen soft nontender no palpable masses Musculoskeletal free of  major deformities Skin clear -no rashes Neurologic normal Lower extremities 3+ femoral and dorsalis pedis pulses palpable bilaterally with no edema Left upper extremity cephalic vein appears adequate on physical exam for AV fistula  Today I ordered bilateral upper extremity vein mapping which I reviewed and interpreted. Also ordered an arterial study. Patient has excellent arterial supply the upper trimmings. Both cephalic veins in the upper arms or marginal and basilic veins appeared to be marginal but adequate left bigger than right      Assessment:     Chronic renal insufficiency-stage IV-needs vascular access and hemodialysis  catheter at-ASAP Appears to have adequate left cephalic vein for fistula creation and upper arm    Plan:     Plan left brachial to cephalic AV fistula and insertion hemodialysis catheter by Dr. Oneida Alar on Monday, January 12 Patient will then notify Dr. Clover Mealy to see when hemodialysis session will be scheduled

## 2013-02-07 NOTE — Preoperative (Signed)
Beta Blockers   Reason not to administer Beta Blockers:Not Applicable 

## 2013-02-07 NOTE — Op Note (Signed)
    OPERATIVE REPORT  DATE OF SURGERY: 02/07/2013  PATIENT: Alexander Grant, 58 y.o. male MRN: [id number]  DOB: [birth year]  PRE-OPERATIVE DIAGNOSIS: End-stage renal disease  POST-OPERATIVE DIAGNOSIS:  Same  PROCEDURE: #1 right IJ hemodialysis catheter with ultrasound visualization, #2 left upper arm brachiocephalic fistula  SURGEON:  Curt Jews, M.D.  PHYSICIAN ASSISTANT: Collins  ANESTHESIA:  Local with sedation  EBL: Minimal ml  Total I/O In: 100 [I.V.:100] Out: 40 [Blood:40]  BLOOD ADMINISTERED: None  DRAINS: None none  SPECIMEN: None  COUNTS CORRECT:  YES  PLAN OF CARE: PACU with chest x-ray pending   PATIENT DISPOSITION:  PACU - hemodynamically stable  PROCEDURE DETAILS: The patient was taken to the operating room placed supine position where the area of the left and right neck and chest were prepped and draped in the usual sterile fashion. SonoSite visualization and local anesthesia the right internal ureter vein was accessed with an 18-gauge needle and a guidewire was passed down the level of the right atrium and this was confirmed with fluoroscopy. A dilator and peel-away sheath were passed over the guidewire and the dilator and guidewire removed. A 27 cm hemodialysis catheter was passed through the peel-away sheath and was positioned level of the distal right atrium. The peel-away sheath was removed. The catheter was brought through a subcutaneous tunnel through a separate stab incision and a 2 lm ports were attached. Both lumens flushed and aspirated easily and were locked with 1000 unit per cc heparin. The catheter secured to the skin with a 3-0 nylon stitch and the entry site was closed with a 4-0 subcuticular stitch. Sterile dressing was applied. The catheter was locked with 1000 unit per cc heparin.  Next attention was turned to the left arm which was prepped and draped in usual sterile fashion. SonoSite ultrasound revealed good caliber of the antecubital vein  extending into the cephalic vein. The cephalic vein was small in the forearm. Using local anesthesia, an incision was made over the antecubital space and carried down to isolate the cephalic vein and the brachial artery. The artery was of good size with minimal atherosclerotic change. The vein was mobilized proximally and distally was not ligated distally and divided. Tributary branches were ligated with 30 and 4-0 silk ties and divided. The vein was mobilized the level of the brachial artery. The vein was gently dilated and was found to be of good caliber. The brachial artery was occluded proximally and distally was opened with an 11 blade and extended longitudinally with Potts scissors. A small arteriotomy was created. The vein was brought into approximation with the artery was cut to appropriate length and was spatulated and sewn end-to-side to the artery with a running 6-0 Prolene suture. Clamps removed and good thrill was noted. The wounds were irrigated with saline. Hemostasis obtained electrocautery. The wounds were closed with 3-0 Vicryl in the subcutaneous and subcuticular tissue. Benzoin and Steri-Strips were applied. Sterile dressing was applied and the patient was taken to the recovery room in stable condition   Curt Jews, M.D. 02/07/2013 2:37 PM

## 2013-02-07 NOTE — Interval H&P Note (Signed)
History and Physical Interval Note:  02/07/2013 7:28 AM  Alexander Grant  has presented today for surgery, with the diagnosis of End Stage Renal Disease  The various methods of treatment have been discussed with the patient and family. After consideration of risks, benefits and other options for treatment, the patient has consented to  Procedure(s): ARTERIOVENOUS (AV) FISTULA CREATION- LEFT BRACHIOCEPHALIC  (Left) INSERTION OF DIALYSIS CATHETER (N/A) as a surgical intervention .  The patient's history has been reviewed, patient examined, no change in status, stable for surgery.  I have reviewed the patient's chart and labs.  Questions were answered to the patient's satisfaction.     Katalin Colledge E

## 2013-02-07 NOTE — Anesthesia Postprocedure Evaluation (Signed)
  Anesthesia Post-op Note  Patient: Alexander Grant  Procedure(s) Performed: Procedure(s): ARTERIOVENOUS (AV) FISTULA CREATION- LEFT BRACHIOCEPHALIC  (Left) INSERTION OF DIALYSIS CATHETER (N/A)  Patient Location: PACU  Anesthesia Type: MAC  Level of Consciousness: awake and alert   Airway and Oxygen Therapy: Patient Spontanous Breathing  Post-op Pain: none  Post-op Assessment: Post-op Vital signs reviewed, Patient's Cardiovascular Status Stable and Respiratory Function Stable  Post-op Vital Signs: Reviewed  Filed Vitals:   02/07/13 0923  BP: 197/101  Pulse: 76  Temp: 37.2 C  Resp: 18    Complications: No apparent anesthesia complications

## 2013-02-07 NOTE — Progress Notes (Signed)
02/07/13 0934  OBSTRUCTIVE SLEEP APNEA  Have you ever been diagnosed with sleep apnea through a sleep study? No  Do you snore loudly (loud enough to be heard through closed doors)?  0  Do you often feel tired, fatigued, or sleepy during the daytime? 1  Has anyone observed you stop breathing during your sleep? 0  Do you have, or are you being treated for high blood pressure? 1  BMI more than 35 kg/m2? 0  Age over 59 years old? 1  Neck circumference greater than 40 cm/18 inches? 0  Gender: 1  Obstructive Sleep Apnea Score 4  Score 4 or greater  Results sent to PCP

## 2013-02-07 NOTE — Progress Notes (Signed)
Discharge instructions given to Alexander Grant via telephone.

## 2013-02-08 ENCOUNTER — Telehealth: Payer: Self-pay | Admitting: Vascular Surgery

## 2013-02-08 NOTE — Telephone Encounter (Addendum)
Message copied by Gena Fray on Tue Feb 08, 2013 11:12 AM ------      Message from: Peter Minium K      Created: Mon Feb 07, 2013  6:34 PM      Regarding: SCHEDULE                   ----- Message -----         From: Alfonso Patten, RN         Sent: 02/07/2013   4:29 PM           To: Mena Goes, CMA, #                        ----- Message -----         From: Rosetta Posner, MD         Sent: 02/07/2013   2:41 PM           To: Patrici Ranks, Alfonso Patten, RN            I picked up this case for Dr. Oneida Alar. And IJ hemodialysis catheter with ultrasound visualization and left upper arm AV fistula. Biochemist, clinical. I did see the patient in one month. ------  02/08/13: spoke with patient, dpm

## 2013-02-10 ENCOUNTER — Encounter (HOSPITAL_COMMUNITY): Payer: Self-pay | Admitting: Vascular Surgery

## 2013-02-15 ENCOUNTER — Other Ambulatory Visit: Payer: Self-pay | Admitting: Vascular Surgery

## 2013-02-15 ENCOUNTER — Encounter (HOSPITAL_COMMUNITY): Payer: Medicaid Other

## 2013-02-15 DIAGNOSIS — Z4931 Encounter for adequacy testing for hemodialysis: Secondary | ICD-10-CM

## 2013-02-15 DIAGNOSIS — N186 End stage renal disease: Secondary | ICD-10-CM

## 2013-03-15 ENCOUNTER — Ambulatory Visit (INDEPENDENT_AMBULATORY_CARE_PROVIDER_SITE_OTHER): Payer: Self-pay | Admitting: Vascular Surgery

## 2013-03-15 ENCOUNTER — Ambulatory Visit (HOSPITAL_COMMUNITY)
Admission: RE | Admit: 2013-03-15 | Discharge: 2013-03-15 | Disposition: A | Discharge status: Home / Self Care | Payer: Medicaid Other | Source: Ambulatory Visit | Attending: Vascular Surgery | Admitting: Vascular Surgery

## 2013-03-15 ENCOUNTER — Encounter: Payer: Self-pay | Admitting: Vascular Surgery

## 2013-03-15 VITALS — BP 165/89 | HR 70 | Resp 18 | Ht 74.0 in | Wt 194.0 lb

## 2013-03-15 DIAGNOSIS — N186 End stage renal disease: Secondary | ICD-10-CM

## 2013-03-15 DIAGNOSIS — Z09 Encounter for follow-up examination after completed treatment for conditions other than malignant neoplasm: Secondary | ICD-10-CM | POA: Insufficient documentation

## 2013-03-15 DIAGNOSIS — Z4931 Encounter for adequacy testing for hemodialysis: Secondary | ICD-10-CM

## 2013-03-15 DIAGNOSIS — Z9889 Other specified postprocedural states: Secondary | ICD-10-CM | POA: Insufficient documentation

## 2013-03-15 NOTE — Progress Notes (Signed)
The patient presents today for followup of his left brachiocephalic AV fistula creation by myself on 02/07/2013. He also had a right hemodialysis catheter placed at the same time. He reports aggravation with the catheter most specifically do to difficulty sleeping. He is anxious to have it removed. He has had no catheter infection.  On physical exam he does have well-healed antecubital incision on the left. He has very good Pairlee Sawtell maturation of his upper arm cephalic vein fistula. It is a good caliber including up onto his shoulder. He does have a few scattered tributary branches which did not appear to be impeding flow through the cephalic vein. He does have some tortuosity of the cephalic vein.  Duplex today shows good flow with good size maturation throughout the cephalic vein. I diameter varies from 0.4-1 cm in diameter.  Impression and plan excellent Athan Casalino maturation one month status post AV fistula creation. I explained that he would need a continued maturation and wall thickness. Do to his outstanding Daymion Nazaire result I would recommend attempting accessing this at 8 weeks which would be approximately one month from now. He will continue his dialysis catheter until that time. He will see [place] again on an as-needed basis

## 2013-04-05 DIAGNOSIS — Z0279 Encounter for issue of other medical certificate: Secondary | ICD-10-CM

## 2013-08-01 ENCOUNTER — Emergency Department (HOSPITAL_COMMUNITY): Payer: No Typology Code available for payment source

## 2013-08-01 ENCOUNTER — Encounter (HOSPITAL_COMMUNITY): Payer: Self-pay | Admitting: Emergency Medicine

## 2013-08-01 ENCOUNTER — Emergency Department (HOSPITAL_COMMUNITY)
Admission: EM | Admit: 2013-08-01 | Discharge: 2013-08-01 | Disposition: A | Discharge status: Home / Self Care | Payer: No Typology Code available for payment source | Attending: Emergency Medicine | Admitting: Emergency Medicine

## 2013-08-01 DIAGNOSIS — N186 End stage renal disease: Secondary | ICD-10-CM | POA: Insufficient documentation

## 2013-08-01 DIAGNOSIS — Z9889 Other specified postprocedural states: Secondary | ICD-10-CM | POA: Insufficient documentation

## 2013-08-01 DIAGNOSIS — S4980XA Other specified injuries of shoulder and upper arm, unspecified arm, initial encounter: Secondary | ICD-10-CM | POA: Insufficient documentation

## 2013-08-01 DIAGNOSIS — I120 Hypertensive chronic kidney disease with stage 5 chronic kidney disease or end stage renal disease: Secondary | ICD-10-CM | POA: Insufficient documentation

## 2013-08-01 DIAGNOSIS — M25511 Pain in right shoulder: Secondary | ICD-10-CM

## 2013-08-01 DIAGNOSIS — S0993XA Unspecified injury of face, initial encounter: Secondary | ICD-10-CM | POA: Insufficient documentation

## 2013-08-01 DIAGNOSIS — S199XXA Unspecified injury of neck, initial encounter: Secondary | ICD-10-CM

## 2013-08-01 DIAGNOSIS — S46909A Unspecified injury of unspecified muscle, fascia and tendon at shoulder and upper arm level, unspecified arm, initial encounter: Secondary | ICD-10-CM | POA: Insufficient documentation

## 2013-08-01 DIAGNOSIS — Y9389 Activity, other specified: Secondary | ICD-10-CM | POA: Insufficient documentation

## 2013-08-01 DIAGNOSIS — Z87442 Personal history of urinary calculi: Secondary | ICD-10-CM | POA: Insufficient documentation

## 2013-08-01 DIAGNOSIS — Y92410 Unspecified street and highway as the place of occurrence of the external cause: Secondary | ICD-10-CM | POA: Insufficient documentation

## 2013-08-01 DIAGNOSIS — Z992 Dependence on renal dialysis: Secondary | ICD-10-CM | POA: Insufficient documentation

## 2013-08-01 DIAGNOSIS — Z79899 Other long term (current) drug therapy: Secondary | ICD-10-CM | POA: Insufficient documentation

## 2013-08-01 HISTORY — DX: Disorder of kidney and ureter, unspecified: N28.9

## 2013-08-01 MED ORDER — HYDROCODONE-ACETAMINOPHEN 5-325 MG PO TABS: 1.0000 | ORAL_TABLET | ORAL | Status: DC | PRN

## 2013-08-01 NOTE — Discharge Instructions (Signed)
Wear the sling for comfort. Be sure to take your arm on it a few times a day for mobility. Take Vicodin for severe pain only. No driving or operating heavy machinery while taking vicodin. This medication may cause drowsiness.  Shoulder Pain The shoulder is the joint that connects your arms to your body. The bones that form the shoulder joint include the upper arm bone (humerus), the shoulder blade (scapula), and the collarbone (clavicle). The top of the humerus is shaped like a ball and fits into a rather flat socket on the scapula (glenoid cavity). A combination of muscles and strong, fibrous tissues that connect muscles to bones (tendons) support your shoulder joint and hold the ball in the socket. Small, fluid-filled sacs (bursae) are located in different areas of the joint. They act as cushions between the bones and the overlying soft tissues and help reduce friction between the gliding tendons and the bone as you move your arm. Your shoulder joint allows a wide range of motion in your arm. This range of motion allows you to do things like scratch your back or throw a ball. However, this range of motion also makes your shoulder more prone to pain from overuse and injury. Causes of shoulder pain can originate from both injury and overuse and usually can be grouped in the following four categories:  Redness, swelling, and pain (inflammation) of the tendon (tendinitis) or the bursae (bursitis).  Instability, such as a dislocation of the joint.  Inflammation of the joint (arthritis).  Broken bone (fracture). HOME CARE INSTRUCTIONS   Apply ice to the sore area.  Put ice in a plastic bag.  Place a towel between your skin and the bag.  Leave the ice on for 15-20 minutes, 3-4 times per day for the first 2 days, or as directed by your health care provider.  Stop using cold packs if they do not help with the pain.  If you have a shoulder sling or immobilizer, wear it as long as your caregiver  instructs. Only remove it to shower or bathe. Move your arm as little as possible, but keep your hand moving to prevent swelling.  Squeeze a soft ball or foam pad as much as possible to help prevent swelling.  Only take over-the-counter or prescription medicines for pain, discomfort, or fever as directed by your caregiver. SEEK MEDICAL CARE IF:   Your shoulder pain increases, or new pain develops in your arm, hand, or fingers.  Your hand or fingers become cold and numb.  Your pain is not relieved with medicines. SEEK IMMEDIATE MEDICAL CARE IF:   Your arm, hand, or fingers are numb or tingling.  Your arm, hand, or fingers are significantly swollen or turn white or blue. MAKE SURE YOU:   Understand these instructions.  Will watch your condition.  Will get help right away if you are not doing well or get worse. Document Released: 10/23/2004 Document Revised: 01/18/2013 Document Reviewed: 12/28/2010 [facility] Patient Information 2015 [place], [place]. This information is not intended to replace advice given to you by your health care provider. Make sure you discuss any questions you have with your health care provider.
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
## 2013-08-02 NOTE — ED Provider Notes (Signed)
Medical screening examination/treatment/procedure(s) were performed by non-physician practitioner and as supervising physician I was immediately available for consultation/collaboration.   EKG Interpretation None       Babette Relic, MD 08/02/13 1353

## 2013-09-19 ENCOUNTER — Other Ambulatory Visit (HOSPITAL_COMMUNITY): Payer: Self-pay | Admitting: *Deleted

## 2013-09-20 ENCOUNTER — Encounter (HOSPITAL_COMMUNITY)
Admission: RE | Admit: 2013-09-20 | Discharge: 2013-09-20 | Disposition: A | Discharge status: Home / Self Care | Payer: Medicaid Other | Source: Ambulatory Visit | Attending: Nephrology | Admitting: Nephrology

## 2013-09-20 DIAGNOSIS — D649 Anemia, unspecified: Secondary | ICD-10-CM | POA: Insufficient documentation

## 2013-09-20 LAB — PREPARE RBC (CROSSMATCH)

## 2013-09-21 ENCOUNTER — Encounter (HOSPITAL_COMMUNITY)
Admission: RE | Admit: 2013-09-21 | Discharge: 2013-09-21 | Disposition: A | Discharge status: Home / Self Care | Payer: Medicaid Other | Source: Ambulatory Visit | Attending: Nephrology | Admitting: Nephrology

## 2013-09-21 ENCOUNTER — Encounter (HOSPITAL_COMMUNITY): Payer: Medicaid Other

## 2013-09-21 DIAGNOSIS — D649 Anemia, unspecified: Secondary | ICD-10-CM | POA: Diagnosis not present

## 2013-09-21 MED ORDER — ACETAMINOPHEN 325 MG PO TABS
ORAL_TABLET | ORAL | Status: AC
  Administered 2013-09-21: 650 mg via ORAL
  Filled 2013-09-21: qty 2

## 2013-09-21 MED ORDER — DIPHENHYDRAMINE HCL 25 MG PO CAPS
ORAL_CAPSULE | ORAL | Status: AC
  Administered 2013-09-21: 25 mg via ORAL
  Filled 2013-09-21: qty 1

## 2013-09-21 MED ORDER — DIPHENHYDRAMINE HCL 25 MG PO CAPS
25.0000 mg | ORAL_CAPSULE | Freq: Once | ORAL | Status: AC
  Administered 2013-09-21: 25 mg via ORAL

## 2013-09-21 MED ORDER — ACETAMINOPHEN 325 MG PO TABS
650.0000 mg | ORAL_TABLET | Freq: Once | ORAL | Status: AC
  Administered 2013-09-21: 650 mg via ORAL

## 2013-09-21 MED ORDER — SODIUM CHLORIDE 0.9 % IV SOLN
Freq: Once | INTRAVENOUS | Status: AC
  Administered 2013-09-21: 09:00:00 via INTRAVENOUS

## 2013-09-22 LAB — TYPE AND SCREEN
ABO/RH(D): A POS
Antibody Screen: NEGATIVE
Unit division: 0
Unit division: 0

## 2013-10-27 ENCOUNTER — Encounter: Payer: Self-pay | Admitting: Vascular Surgery

## 2014-03-20 IMAGING — CT CT CHEST W/O CM
2 of 4 series · 15 of 36 positions shown, 18 images · non-contrast
Comparison: Plain films 09/01/2011

CLINICAL DATA: Shortness of breath, cough.  Pulmonary infiltrates.

CT CHEST WITHOUT CONTRAST
TECHNIQUE: Multidetector CT imaging of the chest was performed
following the standard protocol without IV contrast.

[Series 2: chest w/o st · axial · non-contrast · 0.82mm/px · z∈[-232,+34]mm · 12 of 63 slices shown, 15 images]
[im 5/63  mediastinal]
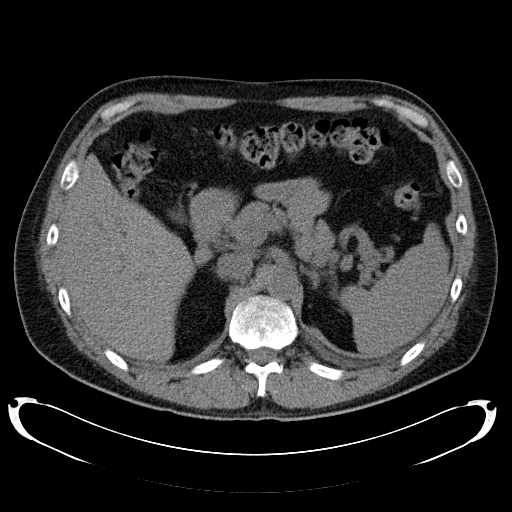
[im 5/63  lung]
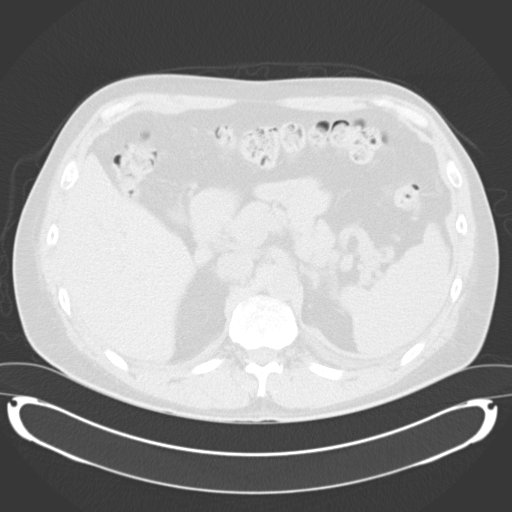
[im 9/63  lung]
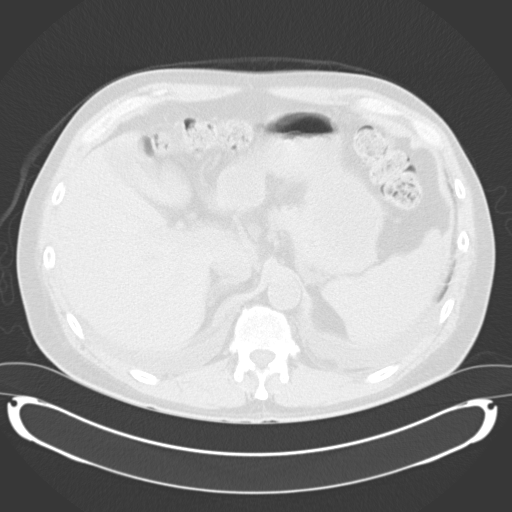
[im 14/63  lung]
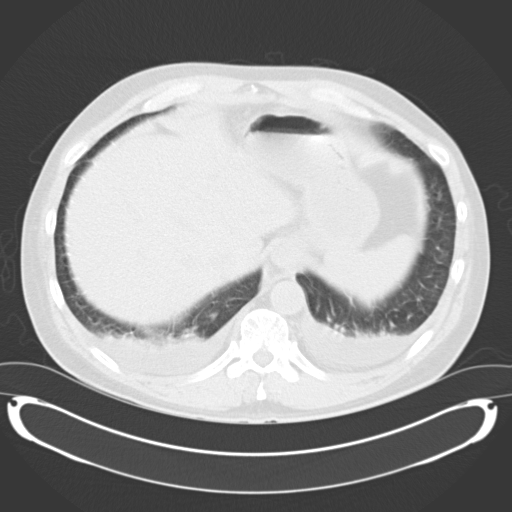
[im 18/63  lung]
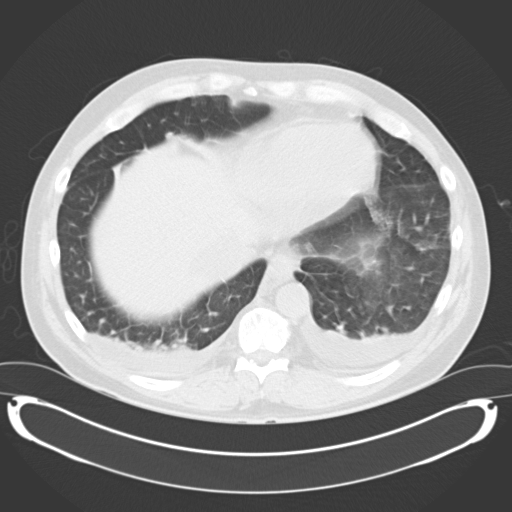
[im 23/63  mediastinal]
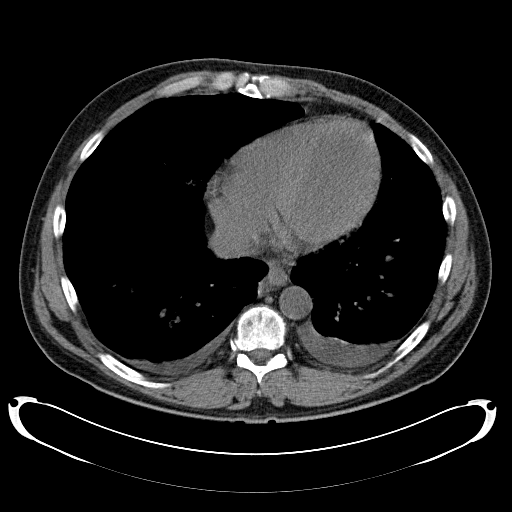
[im 23/63  lung]
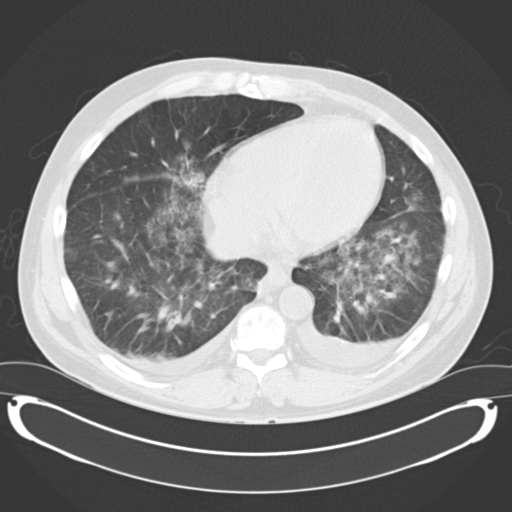
[im 27/63  lung]
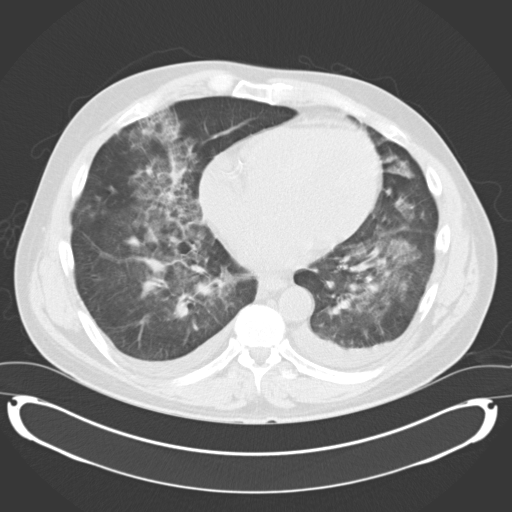
[im 36/63  lung]
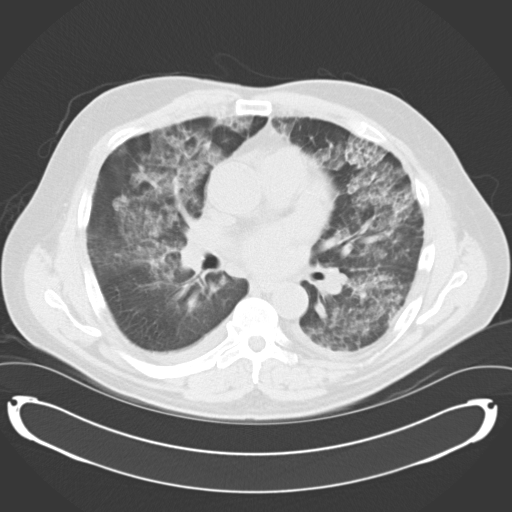
[im 40/63  lung]
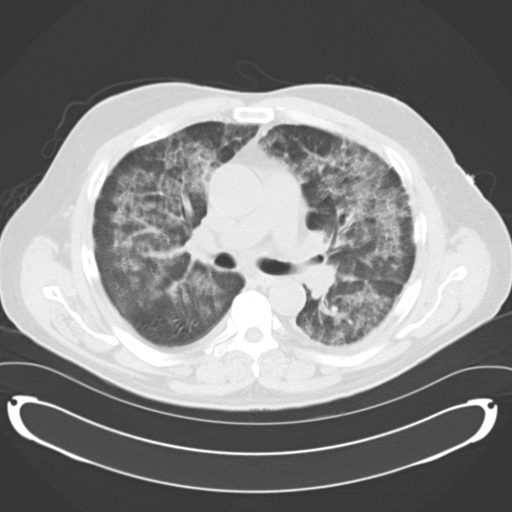
[im 45/63  mediastinal]
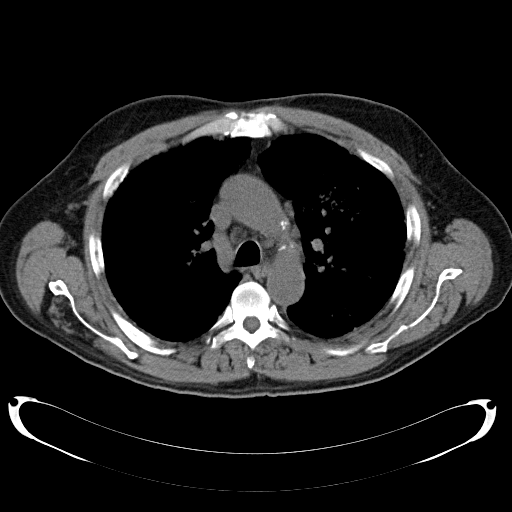
[im 45/63  lung]
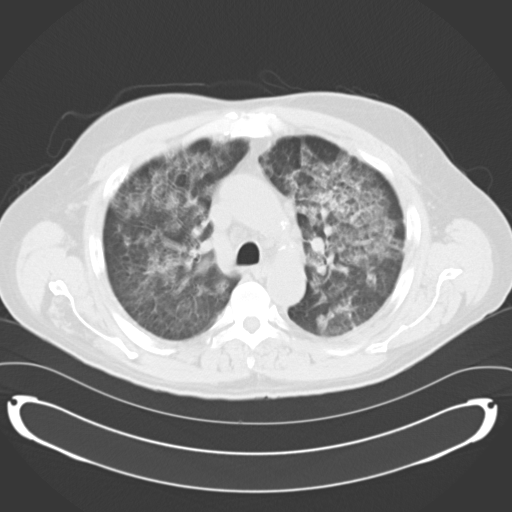
[im 49/63  lung]
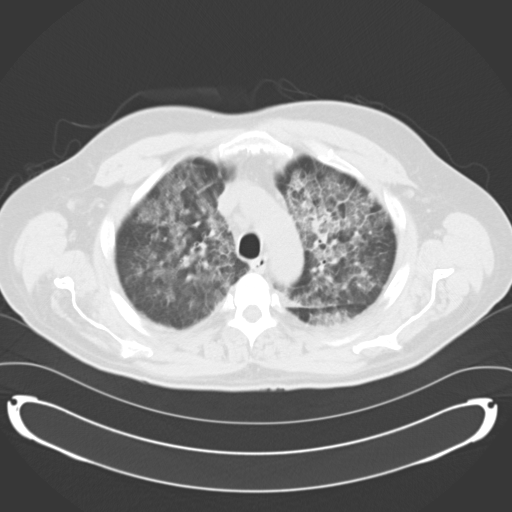
[im 54/63  lung]
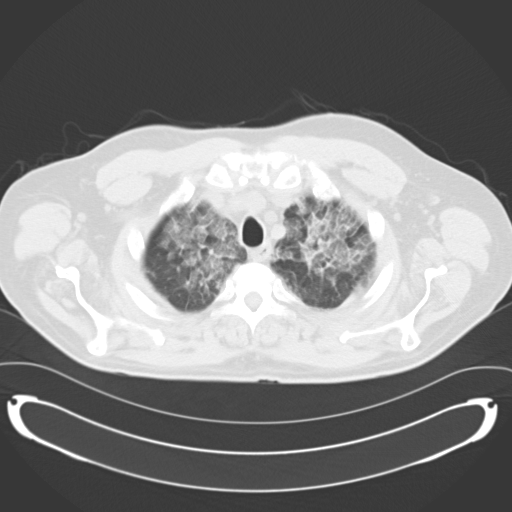
[im 58/63  lung]
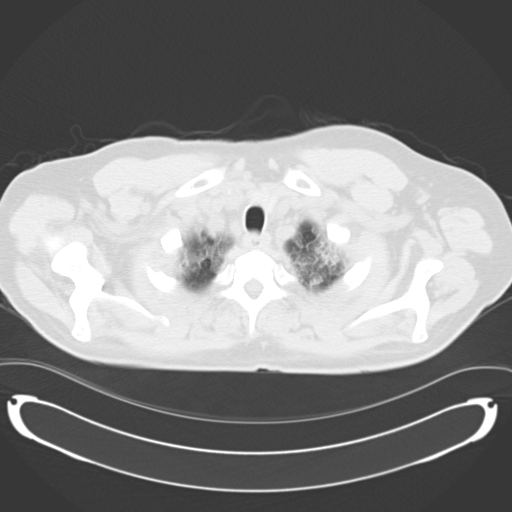

[Series 602: <mpr thick range> · coronal · 0.82mm/px · 3 of 95 slices shown]
[im 19/95  lung]
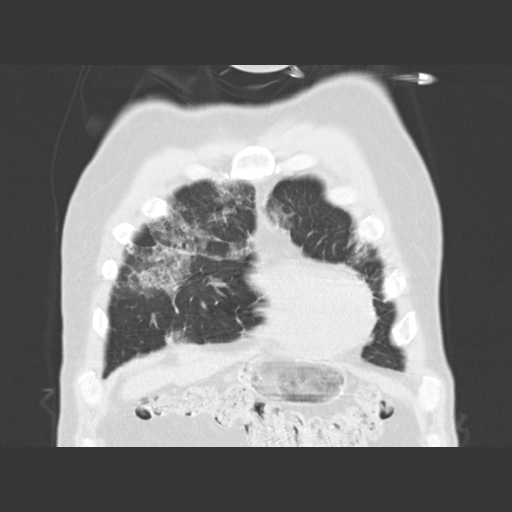
[im 38/95  lung]
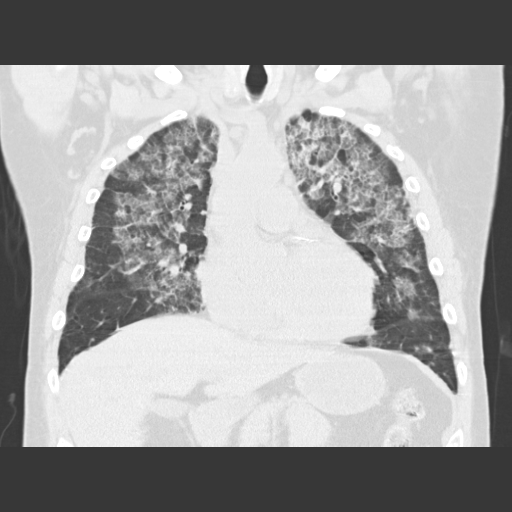
[im 57/95  lung]
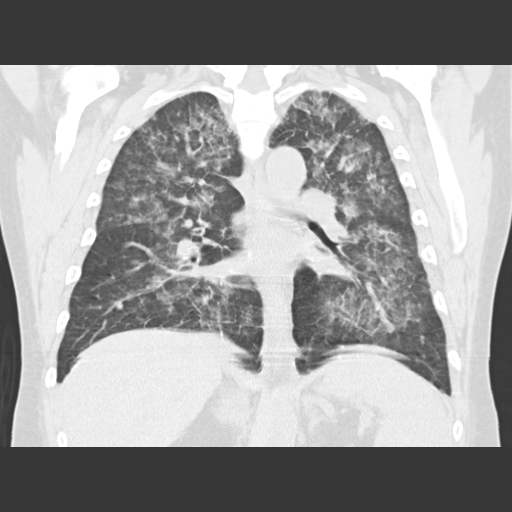

[15 of 36 positions shown; findings below may reference images not displayed]

FINDINGS: Diffuse bilateral interstitial and ground-glass opacities
in the lungs.  This is slightly more pronounced in the mid and
upper lung zones, but involves all lung zones.  Relative sparing in
the bases.  There are small bilateral pleural effusions.  Heart is
normal size.  Borderline sized mediastinal lymph nodes, none
pathologically enlarged.  No definite hilar adenopathy.  No
mediastinal adenopathy.

Coronary artery calcifications are noted, most pronounced in the
left anterior descending artery.

15 mm low density nodule in the right thyroid lobe.  Chest wall
soft tissues are unremarkable. Imaging into the upper abdomen shows
no acute findings..
IMPRESSION: Interstitial and ground-glass opacities throughout the lungs with
relative sparing of the bases.  This is nonspecific and could
represent pulmonary edema, hemorrhage, infection or ARDS.

Small bilateral pleural effusions.

Coronary artery disease.

Right thyroid nodule.  This can be further characterized with
elective thyroid ultrasound.

## 2014-03-26 IMAGING — CR DG CHEST 2V
2 series · 2 of 2 positions shown · non-contrast
Comparison: Chest x-ray of 09/03/2011

CLINICAL DATA: Follow up of multi focal airspace opacities

CHEST - 2 VIEW

[w chest pa]
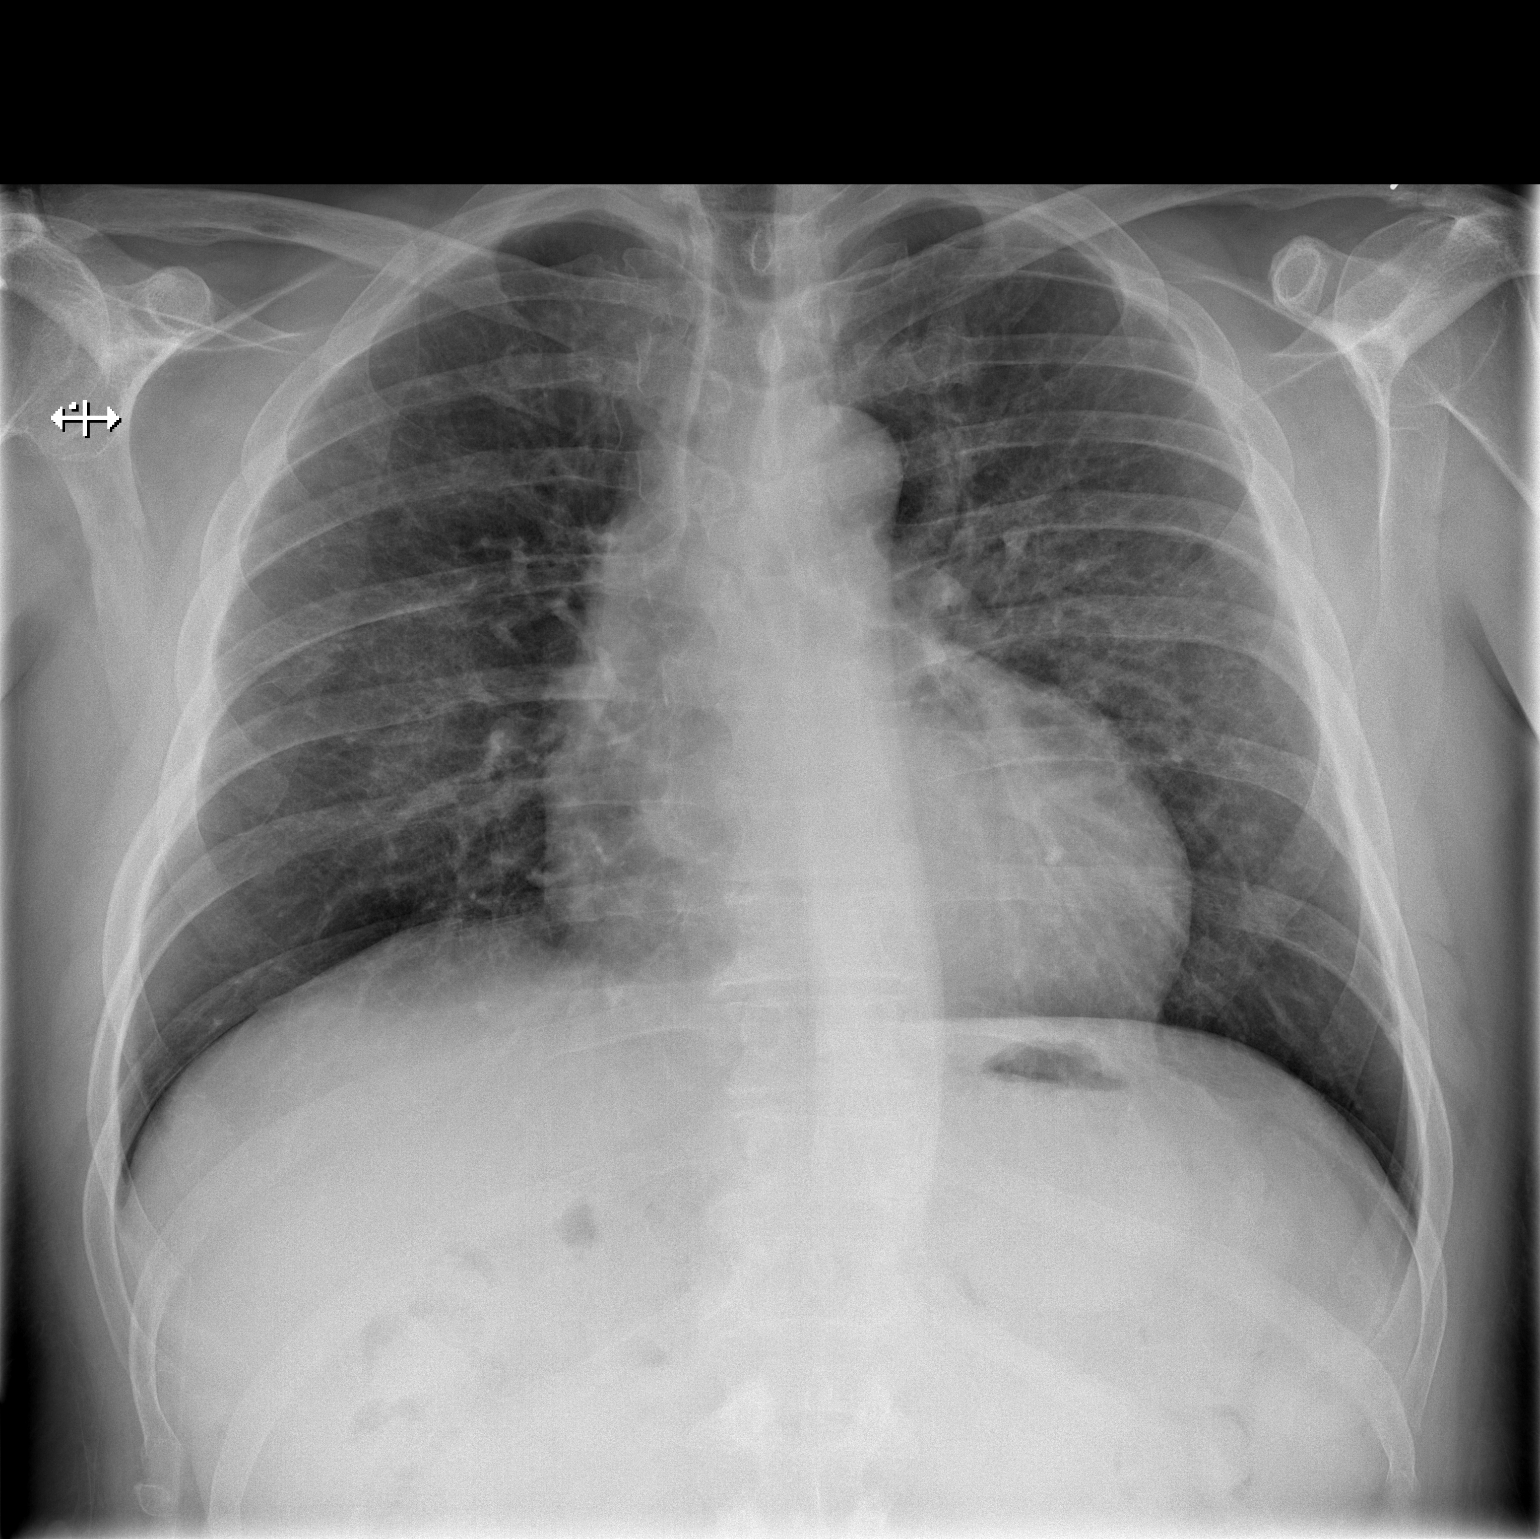

[w chest lat]
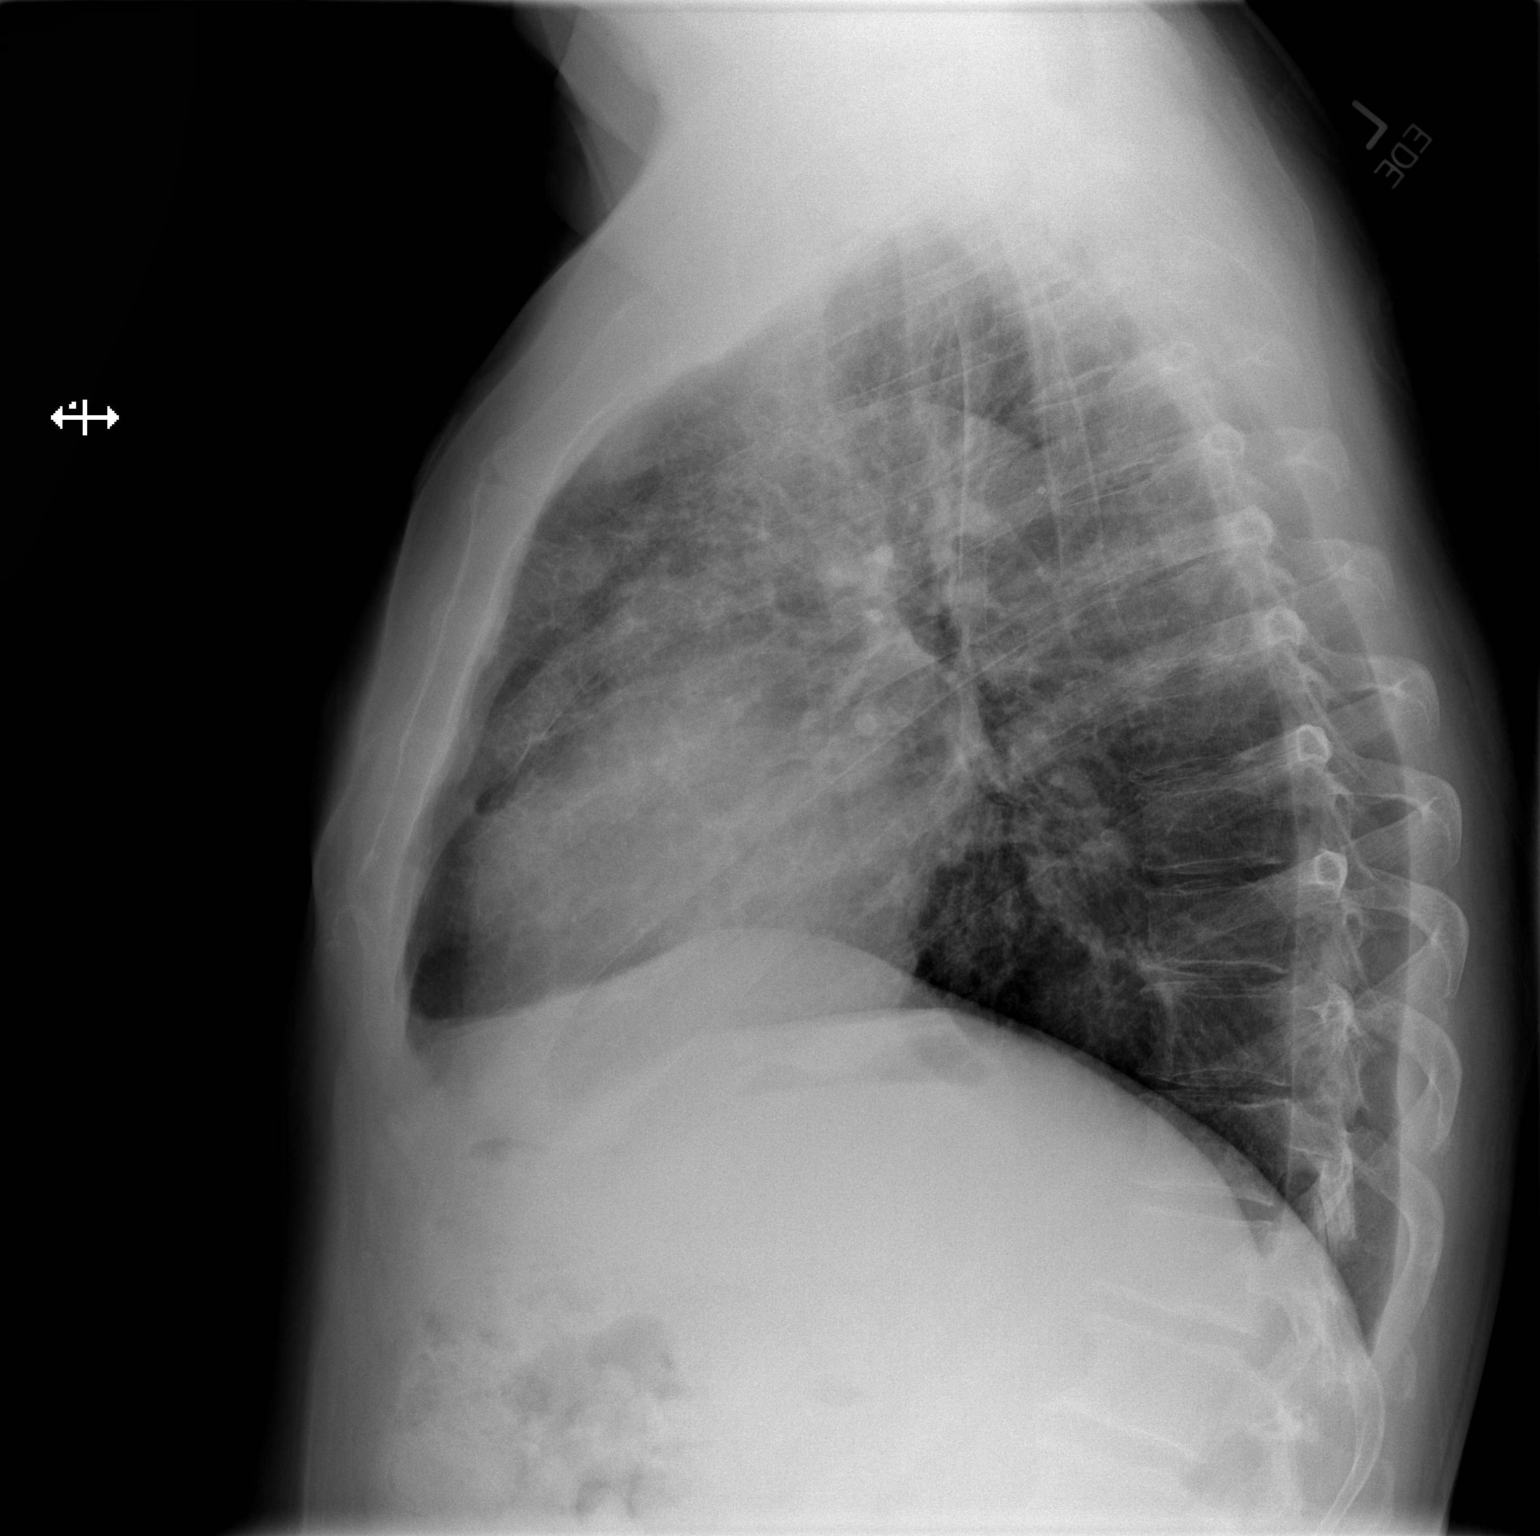

[2 of 2 positions shown; findings below may reference images not displayed]

FINDINGS: The probable edema pattern has largely cleared, and
effusions have resolved.  There are still somewhat prominent
interstitial markings present particularly in the upper lobes.
Mediastinal contours are stable.  The heart is enlarged and stable.
No bony abnormality is seen.
IMPRESSION: Resolution of edema pattern with effusions.  Probable chronic
interstitial change remains.  Recommend continued follow-up.

## 2015-04-25 ENCOUNTER — Other Ambulatory Visit: Payer: Self-pay | Admitting: General Surgery

## 2015-04-25 DIAGNOSIS — K439 Ventral hernia without obstruction or gangrene: Secondary | ICD-10-CM

## 2015-04-27 ENCOUNTER — Ambulatory Visit
Admission: RE | Admit: 2015-04-27 | Discharge: 2015-04-27 | Disposition: A | Discharge status: Home / Self Care | Payer: Medicaid Other | Source: Ambulatory Visit | Attending: General Surgery | Admitting: General Surgery

## 2015-04-27 DIAGNOSIS — K439 Ventral hernia without obstruction or gangrene: Secondary | ICD-10-CM

## 2015-04-28 DIAGNOSIS — K922 Gastrointestinal hemorrhage, unspecified: Secondary | ICD-10-CM

## 2015-04-28 HISTORY — DX: Gastrointestinal hemorrhage, unspecified: K92.2

## 2015-05-08 ENCOUNTER — Observation Stay (HOSPITAL_COMMUNITY): Payer: Medicaid Other

## 2015-05-08 ENCOUNTER — Encounter (HOSPITAL_COMMUNITY): Payer: Self-pay | Admitting: Emergency Medicine

## 2015-05-08 ENCOUNTER — Inpatient Hospital Stay (HOSPITAL_COMMUNITY)
Admission: EM | Admit: 2015-05-08 | Discharge: 2015-05-12 | DRG: 368 | Disposition: A | Discharge status: Home / Self Care | Payer: Medicaid Other | Attending: Internal Medicine | Admitting: Internal Medicine

## 2015-05-08 DIAGNOSIS — R195 Other fecal abnormalities: Secondary | ICD-10-CM

## 2015-05-08 DIAGNOSIS — Q2733 Arteriovenous malformation of digestive system vessel: Secondary | ICD-10-CM

## 2015-05-08 DIAGNOSIS — K2970 Gastritis, unspecified, without bleeding: Secondary | ICD-10-CM | POA: Diagnosis not present

## 2015-05-08 DIAGNOSIS — K439 Ventral hernia without obstruction or gangrene: Secondary | ICD-10-CM | POA: Diagnosis present

## 2015-05-08 DIAGNOSIS — I120 Hypertensive chronic kidney disease with stage 5 chronic kidney disease or end stage renal disease: Secondary | ICD-10-CM

## 2015-05-08 DIAGNOSIS — R0902 Hypoxemia: Secondary | ICD-10-CM

## 2015-05-08 DIAGNOSIS — Z91030 Bee allergy status: Secondary | ICD-10-CM

## 2015-05-08 DIAGNOSIS — N058 Unspecified nephritic syndrome with other morphologic changes: Secondary | ICD-10-CM

## 2015-05-08 DIAGNOSIS — D649 Anemia, unspecified: Secondary | ICD-10-CM

## 2015-05-08 DIAGNOSIS — Z885 Allergy status to narcotic agent status: Secondary | ICD-10-CM

## 2015-05-08 DIAGNOSIS — K209 Esophagitis, unspecified without bleeding: Secondary | ICD-10-CM | POA: Insufficient documentation

## 2015-05-08 DIAGNOSIS — N186 End stage renal disease: Secondary | ICD-10-CM | POA: Diagnosis not present

## 2015-05-08 DIAGNOSIS — D696 Thrombocytopenia, unspecified: Secondary | ICD-10-CM

## 2015-05-08 DIAGNOSIS — K449 Diaphragmatic hernia without obstruction or gangrene: Secondary | ICD-10-CM | POA: Diagnosis present

## 2015-05-08 DIAGNOSIS — R110 Nausea: Secondary | ICD-10-CM

## 2015-05-08 DIAGNOSIS — M3131 Wegener's granulomatosis with renal involvement: Secondary | ICD-10-CM | POA: Diagnosis present

## 2015-05-08 DIAGNOSIS — Z992 Dependence on renal dialysis: Secondary | ICD-10-CM

## 2015-05-08 DIAGNOSIS — R634 Abnormal weight loss: Secondary | ICD-10-CM | POA: Diagnosis present

## 2015-05-08 DIAGNOSIS — K922 Gastrointestinal hemorrhage, unspecified: Secondary | ICD-10-CM | POA: Diagnosis present

## 2015-05-08 DIAGNOSIS — Z87892 Personal history of anaphylaxis: Secondary | ICD-10-CM

## 2015-05-08 DIAGNOSIS — J90 Pleural effusion, not elsewhere classified: Secondary | ICD-10-CM | POA: Diagnosis present

## 2015-05-08 DIAGNOSIS — E8809 Other disorders of plasma-protein metabolism, not elsewhere classified: Secondary | ICD-10-CM

## 2015-05-08 DIAGNOSIS — Z9119 Patient's noncompliance with other medical treatment and regimen: Secondary | ICD-10-CM

## 2015-05-08 DIAGNOSIS — K4090 Unilateral inguinal hernia, without obstruction or gangrene, not specified as recurrent: Secondary | ICD-10-CM | POA: Diagnosis present

## 2015-05-08 DIAGNOSIS — K226 Gastro-esophageal laceration-hemorrhage syndrome: Principal | ICD-10-CM | POA: Diagnosis present

## 2015-05-08 DIAGNOSIS — Z823 Family history of stroke: Secondary | ICD-10-CM

## 2015-05-08 DIAGNOSIS — F1921 Other psychoactive substance dependence, in remission: Secondary | ICD-10-CM

## 2015-05-08 DIAGNOSIS — F1490 Cocaine use, unspecified, uncomplicated: Secondary | ICD-10-CM | POA: Diagnosis present

## 2015-05-08 DIAGNOSIS — R791 Abnormal coagulation profile: Secondary | ICD-10-CM

## 2015-05-08 DIAGNOSIS — N059 Unspecified nephritic syndrome with unspecified morphologic changes: Secondary | ICD-10-CM | POA: Diagnosis present

## 2015-05-08 DIAGNOSIS — K210 Gastro-esophageal reflux disease with esophagitis: Secondary | ICD-10-CM | POA: Diagnosis present

## 2015-05-08 DIAGNOSIS — Z6821 Body mass index (BMI) 21.0-21.9, adult: Secondary | ICD-10-CM

## 2015-05-08 DIAGNOSIS — K920 Hematemesis: Secondary | ICD-10-CM

## 2015-05-08 DIAGNOSIS — R188 Other ascites: Secondary | ICD-10-CM | POA: Diagnosis not present

## 2015-05-08 DIAGNOSIS — K766 Portal hypertension: Secondary | ICD-10-CM | POA: Diagnosis present

## 2015-05-08 DIAGNOSIS — R7989 Other specified abnormal findings of blood chemistry: Secondary | ICD-10-CM

## 2015-05-08 DIAGNOSIS — Z9115 Patient's noncompliance with renal dialysis: Secondary | ICD-10-CM

## 2015-05-08 DIAGNOSIS — K317 Polyp of stomach and duodenum: Secondary | ICD-10-CM | POA: Diagnosis present

## 2015-05-08 DIAGNOSIS — I1311 Hypertensive heart and chronic kidney disease without heart failure, with stage 5 chronic kidney disease, or end stage renal disease: Secondary | ICD-10-CM | POA: Diagnosis present

## 2015-05-08 HISTORY — DX: Gastrointestinal hemorrhage, unspecified: K92.2

## 2015-05-08 LAB — COMPREHENSIVE METABOLIC PANEL
ALT: 9 U/L — ABNORMAL LOW (ref 17–63)
AST: 20 U/L (ref 15–41)
Albumin: 2.6 g/dL — ABNORMAL LOW (ref 3.5–5.0)
Alkaline Phosphatase: 84 U/L (ref 38–126)
Anion gap: 18 — ABNORMAL HIGH (ref 5–15)
BUN: 56 mg/dL — ABNORMAL HIGH (ref 6–20)
CALCIUM: 9.5 mg/dL (ref 8.9–10.3)
CO2: 37 mmol/L — ABNORMAL HIGH (ref 22–32)
Chloride: 84 mmol/L — ABNORMAL LOW (ref 101–111)
Creatinine, Ser: 16.55 mg/dL — ABNORMAL HIGH (ref 0.61–1.24)
GFR calc Af Amer: 3 mL/min — ABNORMAL LOW (ref >60)
GFR calc non Af Amer: 3 mL/min — ABNORMAL LOW (ref >60)
GLUCOSE: 90 mg/dL (ref 65–99)
POTASSIUM: 5 mmol/L (ref 3.5–5.1)
SODIUM: 139 mmol/L (ref 135–145)
TOTAL PROTEIN: 6.7 g/dL (ref 6.5–8.1)
Total Bilirubin: 1.7 mg/dL — ABNORMAL HIGH (ref 0.3–1.2)

## 2015-05-08 LAB — TYPE AND SCREEN
ABO/RH(D): A POS
ANTIBODY SCREEN: NEGATIVE

## 2015-05-08 LAB — CBC
HCT: 31.4 % — ABNORMAL LOW (ref 39.0–52.0)
HEMOGLOBIN: 10.1 g/dL — AB (ref 13.0–17.0)
MCH: 30.6 pg (ref 26.0–34.0)
MCHC: 32.2 g/dL (ref 30.0–36.0)
MCV: 95.2 fL (ref 78.0–100.0)
Platelets: 101 10*3/uL — ABNORMAL LOW (ref 150–400)
RBC: 3.3 MIL/uL — ABNORMAL LOW (ref 4.22–5.81)
RDW: 16.3 % — AB (ref 11.5–15.5)
WBC: 4.7 10*3/uL (ref 4.0–10.5)

## 2015-05-08 LAB — PROTIME-INR
INR: 1.25 (ref 0.00–1.49)
Prothrombin Time: 15.9 seconds — ABNORMAL HIGH (ref 11.6–15.2)

## 2015-05-08 LAB — LIPASE, BLOOD: LIPASE: 34 U/L (ref 11–51)

## 2015-05-08 LAB — POC OCCULT BLOOD, ED: Fecal Occult Bld: POSITIVE — AB

## 2015-05-08 MED ORDER — LIDOCAINE HCL (PF) 1 % IJ SOLN: 5.0000 mL | INTRAMUSCULAR | Status: DC | PRN

## 2015-05-08 MED ORDER — LIDOCAINE-PRILOCAINE 2.5-2.5 % EX CREA: 1.0000 "application " | TOPICAL_CREAM | CUTANEOUS | Status: DC | PRN

## 2015-05-08 MED ORDER — ALTEPLASE 2 MG IJ SOLR
2.0000 mg | Freq: Once | INTRAMUSCULAR | Status: DC | PRN
Start: 2015-05-08 — End: 2015-05-08

## 2015-05-08 MED ORDER — ONDANSETRON HCL 4 MG PO TABS: 4.0000 mg | ORAL_TABLET | Freq: Four times a day (QID) | ORAL | Status: DC | PRN

## 2015-05-08 MED ORDER — HEPARIN SODIUM (PORCINE) 1000 UNIT/ML DIALYSIS: 1000.0000 [IU] | INTRAMUSCULAR | Status: DC | PRN

## 2015-05-08 MED ORDER — DARBEPOETIN ALFA 200 MCG/0.4ML IJ SOSY
200.0000 ug | PREFILLED_SYRINGE | INTRAMUSCULAR | Status: DC
  Administered 2015-05-08: 200 ug via INTRAVENOUS

## 2015-05-08 MED ORDER — PANTOPRAZOLE SODIUM 40 MG IV SOLR: 40.0000 mg | Freq: Two times a day (BID) | INTRAVENOUS | Status: DC

## 2015-05-08 MED ORDER — DARBEPOETIN ALFA 100 MCG/0.5ML IJ SOSY: 100.0000 ug | PREFILLED_SYRINGE | INTRAMUSCULAR | Status: DC

## 2015-05-08 MED ORDER — MORPHINE SULFATE (PF) 2 MG/ML IV SOLN
2.0000 mg | Freq: Once | INTRAVENOUS | Status: AC
  Administered 2015-05-08: 2 mg via INTRAVENOUS
  Filled 2015-05-08: qty 1

## 2015-05-08 MED ORDER — SODIUM CHLORIDE 0.9 % IV SOLN: 100.0000 mL | INTRAVENOUS | Status: DC | PRN

## 2015-05-08 MED ORDER — SODIUM CHLORIDE 0.9 % IV SOLN
80.0000 mg | Freq: Once | INTRAVENOUS | Status: AC
  Administered 2015-05-08: 80 mg via INTRAVENOUS
  Filled 2015-05-08: qty 80

## 2015-05-08 MED ORDER — DARBEPOETIN ALFA 100 MCG/0.5ML IJ SOSY
PREFILLED_SYRINGE | INTRAMUSCULAR | Status: AC
  Filled 2015-05-08: qty 0.5

## 2015-05-08 MED ORDER — SODIUM CHLORIDE 0.9 % IV SOLN
INTRAVENOUS | Status: DC
  Administered 2015-05-08: 1000 mL via INTRAVENOUS
  Administered 2015-05-09: 500 mL via INTRAVENOUS

## 2015-05-08 MED ORDER — ONDANSETRON HCL 4 MG/2ML IJ SOLN
4.0000 mg | Freq: Once | INTRAMUSCULAR | Status: AC
  Administered 2015-05-08: 4 mg via INTRAVENOUS
  Filled 2015-05-08: qty 2

## 2015-05-08 MED ORDER — PANTOPRAZOLE SODIUM 40 MG IV SOLR
40.0000 mg | Freq: Two times a day (BID) | INTRAVENOUS | Status: DC
  Administered 2015-05-08 – 2015-05-11 (×6): 40 mg via INTRAVENOUS
  Filled 2015-05-08 (×6): qty 40

## 2015-05-08 MED ORDER — SODIUM CHLORIDE 0.9 % IV SOLN
125.0000 mg | INTRAVENOUS | Status: DC
  Administered 2015-05-08 – 2015-05-12 (×3): 125 mg via INTRAVENOUS
  Filled 2015-05-08 (×6): qty 10

## 2015-05-08 MED ORDER — DARBEPOETIN ALFA 200 MCG/0.4ML IJ SOSY
PREFILLED_SYRINGE | INTRAMUSCULAR | Status: AC
  Filled 2015-05-08: qty 0.4

## 2015-05-08 MED ORDER — ONDANSETRON HCL 4 MG/2ML IJ SOLN: 4.0000 mg | Freq: Three times a day (TID) | INTRAMUSCULAR | Status: DC | PRN

## 2015-05-08 MED ORDER — PENTAFLUOROPROP-TETRAFLUOROETH EX AERO: 1.0000 "application " | INHALATION_SPRAY | CUTANEOUS | Status: DC | PRN

## 2015-05-08 MED ORDER — ONDANSETRON HCL 4 MG/2ML IJ SOLN
4.0000 mg | Freq: Four times a day (QID) | INTRAMUSCULAR | Status: DC | PRN
  Administered 2015-05-10: 4 mg via INTRAVENOUS

## 2015-05-08 NOTE — Progress Notes (Signed)
Left AV graft de-accessed.  Pressure held 20 minutes until bleeding stopped.  Dressing applied.

## 2015-05-08 NOTE — ED Notes (Signed)
Renal MD at bedside.

## 2015-05-08 NOTE — ED Notes (Signed)
PA at bedside.

## 2015-05-08 NOTE — H&P (Signed)
Date: 05/08/2015               Patient Name:  Alexander Grant. MRN: [id number]  DOB: 1955/08/03 Age / Sex: 60 y.o., male   PCP: Corliss Parish, MD         Medical Service: Internal Medicine Teaching Service         Attending Physician: Dr. Annia Belt, MD    First Contact: Dr. Marijean Bravo Pager: [phone]  Second Contact: Dr. Posey Pronto Pager: [phone]       After Hours (After 5p/  First Contact Pager: [phone]  weekends / holidays): Second Contact Pager: [phone]   Chief Complaint: Vomited blood at HD  History of Present Illness: Alexander Grant. is a 60 y.o. male w/ PMHx of HTN, ESRD on HD (TTS), with h/o renal biopsy significant for pauci-immune GN, and h/o substance abuse, presents to the ED after having witnessed hematemesis at HD today. Patient states he has not been feeling well for the past week or so and has had a very poor appetite and has lost 10 lbs over the past week. He just started vomiting yesterday and says he has vomited about 3-4 times since then, but just noticed blood in his vomit today at HD. He says he drank cranberry juice this AM, however, per chart review and per patient, his vomit was heme positive. He also admits to some recent abdominal discomfort and was actually seen in the ED for this at the end of March and had a CT scan which showed large volume ascites, ventral hernia, right inguinal hernia, right pleural effusion, and gallbladder sludge. He denies any NSAID use and also denies dark stools, or hematochezia. Patient also mentions some recent SOB over the past few days as well and was actually noted to be mildly hypoxic on RA in the ED. Denies sinusitis, congestion, rhinorrhea or epistaxis. Also denies sick contacts.   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 Also during this admission, patient had EGD and colonoscopy which showed esophagitis/gastritis (H. Pylori negative) and tubular adenomas x3, respectively. Colonoscopy was also poor prep and it was recommended that he have a repeat in 6 months, however, this was never done.    Meds: Current Facility-Administered Medications  Medication Dose Route Frequency Provider Last Rate Last Dose  . ondansetron (ZOFRAN) tablet 4 mg  4 mg Oral Q6H PRN Corky Sox, MD       Or  . ondansetron [facility]) injection 4 mg  4 mg Intravenous Q6H PRN Corky Sox, MD       Current Outpatient Prescriptions  Medication Sig Dispense Refill  . acetaminophen (TYLENOL) 500 MG tablet Take 1,000 mg by mouth every 6 (six) hours as needed for mild pain.    . cinacalcet (SENSIPAR) 30 MG tablet Take 60 mg by mouth every other day.     Marland Kitchen RENAGEL 800 MG tablet Take 2,400 mg by mouth 3 (three) times daily.  6    Allergies: Allergies as  of 05/08/2015 - Review Complete 05/08/2015  Allergen Reaction Noted  . Bee pollen Anaphylaxis, Shortness Of Breath, and Rash 10/01/2011  . Bee venom Anaphylaxis 08/01/2013  . Percocet [oxycodone-acetaminophen] Hives and Rash 09/01/2011  . Percocet [oxycodone-acetaminophen] Hives 08/01/2013   Past Medical History  Diagnosis Date  . Fournier gangrene     with peri-rectal/perineal abscess drainage  . PONV (postoperative nausea and vomiting)   . Wegener's granulomatosis with renal involvement ([place])   . Anemia   . History of blood transfusion 08/2011  . Lower GI bleeding 10/01/2011  . Cocaine abuse 2013  . Tubular adenoma of colon   . Hyponatremia   . Acute  renal failure ([place])   . Pulmonary infiltrate   . Reflux esophagitis   . Diverticulosis   . Right thyroid nodule   . Cardiomegaly   . Renal disorder     Dialysis  . Kidney stone   . Hypertension    Past Surgical History  Procedure Laterality Date  . Esophagogastroduodenoscopy  09/10/2011    Procedure: ESOPHAGOGASTRODUODENOSCOPY (EGD);  Surgeon: Jerene Bears, MD;  Location: [place];  Service: Gastroenterology;  Laterality: N/A;  . Colonoscopy  09/11/2011    Procedure: COLONOSCOPY;  Surgeon: Jerene Bears, MD;  Location: [place];  Service: Gastroenterology;  Laterality: N/A;  . Knee arthroscopy w/ medial collateral ligament (mcl) repair  ~ 2000    left  . Knee arthroscopy w/ acl reconstruction  2002  . Shoulder open rotator cuff repair   09/1999    right  . Incision and drainage perirectal abscess  05/2008    'for Fournier's gangrene"  . Kidney stone surgery  1990  . Vasectomy    . Fracture surgery Right     wrist and great toe  . Av fistula placement Left 02/07/2013    Procedure: ARTERIOVENOUS (AV) FISTULA CREATION- LEFT BRACHIOCEPHALIC ;  Surgeon: Rosetta Posner, MD;  Location: [place];  Service: Vascular;  Laterality: Left;  . Insertion of dialysis catheter N/A 02/07/2013    Procedure: INSERTION OF DIALYSIS CATHETER;  Surgeon: Rosetta Posner, MD;  Location: [place];  Service: Vascular;  Laterality: N/A;  . Anterior cruciate ligament repair    . Shoulder surgery Left    Family History  Problem Relation Age of Onset  . Stroke Mother   . Cancer Father    Social History   Social History  . Marital Status: Divorced    Spouse Name: N/A  . Number of Children: N/A  . Years of Education: N/A   Occupational History  . Not on file.   Social History Main Topics  . Smoking status: Never Smoker   . Smokeless tobacco: Not on file  . Alcohol Use: No     Comment: 10/01/2011 "last alcohol ~ 2012"  . Drug Use: No     Comment: as recent as 09/02/11 per toxicology screen at hospital  .  Sexual Activity: Not Currently   Other Topics Concern  . Not on file   Social History Narrative   ** Merged History Encounter **        Review of Systems: General: Positive for decreased appetite and fatigue. Denies fever, chills, diaphoresis.  Respiratory: Positive for SOB/DOE and dry cough. Denies chest tightness, and wheezing.   Cardiovascular: Denies chest pain and palpitations.  Gastrointestinal: Positive for nausea, vomiting, abdominal pain, and hematemesis. Denies diarrhea, constipation, blood in stool and abdominal distention.  Genitourinary: Denies dysuria, urgency, frequency, hematuria, and flank pain. Endocrine: Denies hot  or cold intolerance, polyuria, and polydipsia. Musculoskeletal: Denies myalgias, back pain, joint swelling, arthralgias and gait problem.  Skin: Denies pallor, rash and wounds.  Neurological: Positive for generalized weakness. Denies dizziness, seizures, syncope, lightheadedness, numbness and headaches.  Psychiatric/Behavioral: Denies mood changes, confusion, nervousness, sleep disturbance and agitation.   Physical Exam: Blood pressure 145/84, pulse 70, temperature 98.1 F (36.7 C), temperature source Oral, resp. rate 19, SpO2 98 %.  General: Slightly chronically ill appearing white male, alert, cooperative, NAD. HEENT: PERRL, EOMI. Moist mucus membranes. No significant findings.  Neck: Full range of motion without pain, supple, no lymphadenopathy or carotid bruits. JVD noted.  Lungs: Air entry equal bilaterally, rales heard in the bases, R>L. No wheezes or rhonchi.  Heart: RRR, no gallops, or rubs. 3/6 holosystolic murmur heard loudest over the mitral area. Laterally displaced/palpable apical heart beat.  Abdomen: Soft, very mildly tender diffusely, more in the RUQ, distended, BS +. Fluid wave.  Extremities: No cyanosis, clubbing. Trace pitting edema.  Neurologic: Alert & oriented x3, cranial nerves II-XII intact, strength grossly intact, sensation  intact to light touch   Lab results: Basic Metabolic Panel:  Recent Labs  05/08/15 0035  NA 139  K 5.0  CL 84*  CO2 37*  GLUCOSE 90  BUN 56*  CREATININE 16.55*  CALCIUM 9.5   Liver Function Tests:  Recent Labs  05/08/15 0035  AST 20  ALT 9*  ALKPHOS 84  BILITOT 1.7*  PROT 6.7  ALBUMIN 2.6*   No results for input(s): LIPASE, AMYLASE in the last 72 hours. No results for input(s): AMMONIA in the last 72 hours. CBC:  Recent Labs  05/08/15 0035  WBC 4.7  HGB 10.1*  HCT 31.4*  MCV 95.2  PLT 101*   Urine Drug Screen: Drugs of Abuse     Component Value Date/Time   LABOPIA NONE DETECTED 10/01/2011 0945   COCAINSCRNUR NONE DETECTED 10/01/2011 0945   LABBENZ NONE DETECTED 10/01/2011 0945   AMPHETMU NONE DETECTED 10/01/2011 0945   THCU NONE DETECTED 10/01/2011 0945   LABBARB NONE DETECTED 10/01/2011 0945    Urinalysis: No results for input(s): COLORURINE, LABSPEC, PHURINE, GLUCOSEU, HGBUR, BILIRUBINUR, KETONESUR, PROTEINUR, UROBILINOGEN, NITRITE, LEUKOCYTESUR in the last 72 hours.  Invalid input(s): APPERANCEUR  Imaging results:  X-ray Chest Pa And Lateral  05/08/2015  CLINICAL DATA:  60 year old male with vomiting today during dialysis. Hypoxia and cough. Initial encounter. EXAM: CHEST  2 VIEW COMPARISON:  [facility] CT Abdomen and Pelvis 04/27/2015 Report of chest radiograph 02/07/2013 (no images available). FINDINGS: Low lung volumes with continued small to moderate right pleural effusion. No superimposed pneumothorax, pulmonary edema, or confluent pulmonary opacity. Stable cardiomegaly and mediastinal contours. Visualized tracheal air column is within normal limits. No acute osseous abnormality identified. Calcified aortic atherosclerosis. Gray hazy opacity throughout the abdomen compatible with the large volume ascites seen recently by CT. IMPRESSION: 1. Small to moderate right pleural effusion appears stable since 04/27/2015. 2. No new cardiopulmonary  abnormality. 3. Ascites. Electronically Signed   By: Genevie Ann M.D.   On: 05/08/2015 11:41    Other results: EKG: NSR, left-axis deviation, non-specific IVCD.   Assessment & Plan by Problem:  60 y.o. male w/ PMHx of HTN, ESRD on HD (TTS), with h/o renal biopsy significant for pauci-immune GN, and h/o substance abuse, admitted for hematemesis from HD.   Hematemesis: Says this started this AM at HD. Has vomited 3-4 times in the past 24 hours, in addition to abdominal pain, and decreased appetite (the  latter of which has been present for 1 week). Previous EGD in 2013 showed gastritis and esophagitis (H. Pylori negative). Placed on PPI at that time, but is no longer taking this. Denies recent NSAID use, alcohol or drug use. No hematemesis, melena, or diarrhea. Also denies fever or chills. Vitals stable. Suspect this is recurrent esophagitis/gastritis vs mild Mallory Weiss tear 2/2 recurrent vomiting in the setting of a viral gastroenteritis. Patient also with ascites but recent CT abdomen not suggestive of significant liver pathology, therefore doubt varices. Hb also at baseline, if not slightly above, likely related to mild hemoconcentration.  -Admit to med-surg -GI consulted in ED, appreciate assistance. For EGD in AM -Protonix 40 mg IV bid -Zofran prn for nausea.  -Check UDS -Repeat CBC in AM  SOB: Mild decreased SpO2 in ED, suspect this is related to mild volume overload given intermittent compliance with HD. May also be related to right-sided pleural effusion seen on previous CT abdomen, as well as ascites, may be limiting ability to breathe deeply.  -CXR pending -HD per renal (did not receive full HD today) -Supplemental O2 -May benefit from therapeutic paracentesis in the future  Ascites: Etiology really unclear, although suspect this is primarily related to his poor compliance with HD as well as low albumin. CT scan does not suggest cirrhosis. LFT's only significant for mild total bili  elevation. Possibly related to known h/o vasculitis, although do not know this association. Suspect large volume ascites may be contributing to his SOB as well.  -Consider therapeutic paracentesis if volume not improved with HD -CMP in AM   ESRD on HD (TTS): renal biopsy in 2013 showed pauci-immune necrotizing and focal sclerosing GN with some fibrosing crescent formation. Thought to be related to Wegener's vs Levamisole vasculitis. ANCA positive. Receives HD every Tuesday and Saturday per the patient, does not go on most Thursdays. Says he still makes urine.  -HD per renal  HTN: Not on any BP medications. Managed by HD.  -May need further treatment if unable to be controlled, however, patient has extensive h/o poor compliance per renal notes in the past.  -Continue to monitor  H/o Substance Abuse: Has not used for some time per patient.  -UDS pending  DVT/PE PPx: SCD's  Dispo: Disposition is deferred at this time, awaiting improvement of current medical problems. Anticipated discharge in approximately 1-2 day(s).   The patient does not have a current PCP Corliss Parish, MD) and does need an [facility] hospital follow-up appointment after discharge.  The patient does not have transportation limitations that hinder transportation to clinic appointments.  Signed: Corky Sox, MD 05/08/2015, 10:52 AM

## 2015-05-08 NOTE — Consult Note (Signed)
[facility] Renal Consultation Note    Indication for Consultation:  Management of ESRD/hemodialysis; anemia, hypertension/volume and secondary hyperparathyroidism PCP: NONE  HPI: Alexander Grant. is a 60 y.o. 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  He presented to dialysis this am and about an hour into treatment vomiting bloody emesis; He thought it was the cranberry juice he had this am, but the dialysis RN checked and it was heme +. The patient wanted to drive himself to the ED, but the RN convinced him to come to the ED because his abdomen was quite distended and taut.  Evaluation in the ED showed heme + stool Hgb 10.1 K 5 Cr 16.55 BUN 56, T. Bili 1.6 alb 2.6 Ca 9.5 platelets 101. CXR showed small to mod right pleural effusion and significant ascites. He told me has was to have had a ventral hernia repair but it was postponed due to abdominal distension.  He had an abdominal CT 3/31 with large volume ascites, small midline supraumbilical ventral hernia and right inguinal hernia, GB sludge without inflammation and stable 1.7 cm left hepatic cyst.    He also reports N and V last night and hasn't been able to eat much due to early satiety. He thinks he has lost 10# but his EDW has not been lowered proportionately. He has gone to 2 of the last 6 dialysis treatments and one of those was today. He has not had any more vomiting since arrival.  He denies SOB, CP, constipation, diarrhea.  He makes some urine.  Past Medical History  Diagnosis Date  . Fournier gangrene     with peri-rectal/perineal abscess drainage  . PONV (postoperative nausea and vomiting)   . Wegener's granulomatosis with renal involvement ([place])   . Anemia   . History of blood transfusion 08/2011  . Lower GI  bleeding 10/01/2011  . Cocaine abuse 2013  . Tubular adenoma of colon   . Hyponatremia   . Acute renal failure ([place])   . Pulmonary infiltrate   . Reflux esophagitis   . Diverticulosis   . Right thyroid nodule   . Cardiomegaly   . Renal disorder     Dialysis  . Kidney stone   . Hypertension    Past Surgical History  Procedure Laterality Date  . Esophagogastroduodenoscopy  09/10/2011    Procedure: ESOPHAGOGASTRODUODENOSCOPY (EGD);  Surgeon: Jerene Bears, MD;  Location: [place];  Service: Gastroenterology;  Laterality: N/A;  . Colonoscopy  09/11/2011    Procedure: COLONOSCOPY;  Surgeon: Jerene Bears, MD;  Location: [place];  Service: Gastroenterology;  Laterality: N/A;  . Knee arthroscopy w/ medial collateral ligament (mcl) repair  ~ 2000    left  . Knee arthroscopy w/ acl reconstruction  2002  . Shoulder open rotator cuff repair   09/1999    right  . Incision and drainage perirectal abscess  05/2008    'for Fournier's gangrene"  . Kidney stone surgery  1990  . Vasectomy    . Fracture surgery Right     wrist and great toe  . Av fistula placement Left 02/07/2013    Procedure: ARTERIOVENOUS (AV) FISTULA CREATION- LEFT BRACHIOCEPHALIC ;  Surgeon: Rosetta Posner, MD;  Location: [place];  Service: Vascular;  Laterality: Left;  . Insertion of dialysis catheter N/A 02/07/2013    Procedure: INSERTION OF DIALYSIS CATHETER;  Surgeon: Rosetta Posner, MD;  Location: [place];  Service: Vascular;  Laterality: N/A;  . Anterior cruciate ligament repair    . Shoulder surgery Left    Family History  Problem Relation Age of Onset  . Stroke Mother   . Cancer Father    Social History:  reports that he has never smoked. He does not have any smokeless tobacco history on file. He reports that he does not drink alcohol or use illicit drugs. Allergies  Allergen Reactions  . Bee Pollen Anaphylaxis, Shortness Of Breath and Rash  . Bee Venom Anaphylaxis  . Percocet [Oxycodone-Acetaminophen] Hives and  Rash  . Percocet [Oxycodone-Acetaminophen] Hives   Prior to Admission medications   Medication Sig Start Date End Date Taking? Authorizing Provider  acetaminophen (TYLENOL) 500 MG tablet Take 1,000 mg by mouth every 6 (six) hours as needed for mild pain.   Yes Historical Provider, MD  cinacalcet (SENSIPAR) 30 MG tablet Take 60 mg by mouth every other day.    Yes Historical Provider, MD  RENAGEL 800 MG tablet Take 2,400 mg by mouth 3 (three) times daily. 03/12/15  Yes Historical Provider, MD   Current Facility-Administered Medications  Medication Dose Route Frequency Provider Last Rate Last Dose  . ondansetron (ZOFRAN) tablet 4 mg  4 mg Oral Q6H PRN Corky Sox, MD       Or  . ondansetron [facility]) injection 4 mg  4 mg Intravenous Q6H PRN Corky Sox, MD      . pantoprazole (PROTONIX) injection 40 mg  40 mg Intravenous Q12H Jake Church Masters, [facility]       Labs: Basic Metabolic Panel:  Recent Labs Lab 05/08/15 0035  NA 139  K 5.0  CL 84*  CO2 37*  GLUCOSE 90  BUN 56*  CREATININE 16.55*  CALCIUM 9.5   Liver Function Tests:  Recent Labs Lab 05/08/15 0035  AST 20  ALT 9*  ALKPHOS 84  BILITOT 1.7*  PROT 6.7  ALBUMIN 2.6*   CBC:  Recent Labs Lab 05/08/15 0035  WBC 4.7  HGB 10.1*  HCT 31.4*  MCV 95.2  PLT 101*   Studies/Results: X-ray Chest Pa And Lateral  05/08/2015  CLINICAL DATA:  60 year old male with vomiting today during dialysis. Hypoxia and cough. Initial encounter. EXAM: CHEST  2 VIEW COMPARISON:   Imaging CT Abdomen and Pelvis 04/27/2015 Report of chest radiograph 02/07/2013 (no images available). FINDINGS: Low lung volumes with continued small to moderate right pleural effusion. No superimposed pneumothorax, pulmonary edema, or confluent pulmonary opacity. Stable cardiomegaly and mediastinal contours. Visualized tracheal air column is within normal limits. No acute osseous abnormality identified. Calcified aortic atherosclerosis. Gray hazy opacity  throughout the abdomen compatible with the large volume ascites seen recently by CT. IMPRESSION: 1. Small to moderate right pleural effusion appears stable since 04/27/2015. 2. No new cardiopulmonary abnormality. 3. Ascites. Electronically Signed   By: Genevie Ann M.D.   On: 05/08/2015 11:41    ROS: As per HPI.  He tends to minimize symptoms  Physical Exam: Filed Vitals:   05/08/15 1200 05/08/15 1230 05/08/15 1245 05/08/15 1332  BP: 126/92 140/94 128/105 150/82  Pulse: 73 74 70 72  Temp:    97.8 F (36.6 C)  TempSrc:    Oral  Resp: 19 26 17 18   Weight:    79.47 kg (175 lb 3.2 oz)  SpO2: 96% 97% 94% 100%     General:  Chronically ill WM NAD conversant Head: Normocephalic, atraumatic, sclera  non-icteric, mucus membranes are moist Fundi unremarkable Neck: Supple. JVD not elevated.PCL Lungs:  Rales at bases with some dim BS at bases Breathing is unlabored. Heart: RRR with 2/6 murmur  Abdomen: Soft,  Mildly tender, distended with normoactive bowel sounds. No rebound/guarding+ significant ascites; small ventral hernia - above umbilicus. M-S:  Bilateral upper and lower extremity muscle wasting Lower extremities: tr - 1+  edema or ischemic changes, no open wounds  Neuro: Alert and oriented X 3. Moves all extremities spontaneously. Psych:  Responds to questions appropriately with a normal affect. Dialysis Access: left upper AVF+ bruit  Dialysis Orders:  [facility] 4 hr 350 800 EDW 82.5 2 K 2.25 Ca profile 2 left AVF no heparin - Aranesp 200 weekly last 4/4 - due today Recent labs:   Hgb 10 4/4 trending up - had been in the 8s in Feb tsat 26% 3/21 - not treated- ferritin 780 1/24 iPTH 242 - Hectorol 1 on hold due to ^^ correct Ca (supposed to be on renvela 3 ac and sensipar 60) P has been ^^^  Assessment/Plan: 1. Hematemesis/ heme + stool - for EGD tomorrow; work up per primary/GI 2. ESRD  TTS Secondary to pauci-immune glomerular neprhitis; had HD today for about one hour - K 5 Cr 16 consistent  with inadequate HD BUN 56; he has been on no heparin HD. He could also be having some uremic symptoms that may be contributing to N and V.Needs adequate HD and lower vol 3. Hypertension/volume/ascites  - previously on norvasc - has not been taking; try to UF volume - may need  Paracentesis lower vol first 4. Anemia  - continue ESA due for aranesp 200 today;  Give ferrlicit 0000000 x 4 5. Metabolic bone disease -  Noncompliant with binders/sensipar resume when off NPO status- hold on hectorol for now 6. Nutrition - albumin serially trending down in the outpt setting; needs high pro diet/adequate calories/unintentional weight loss; alb 2.6; NPO at present 7. Thrombocytopenia- follow 8. Medical and dialysis noncompliance - contributory to multiple issues 9. Ventral hernia - stable followed by Dr. Marlou Starks 10. NONADHERENCE  Myriam Jacobson, PA-C [place] [phone] 05/08/2015, 1:45 PM I have seen and examined this patient and agree with the plan of care seen, examined, eval, counseled. .  Chisom Aust L 05/08/2015, 3:36 PM

## 2015-05-08 NOTE — ED Notes (Signed)
Dr, Deterding at bedside for patient evaluation.

## 2015-05-08 NOTE — Consult Note (Addendum)
Referring Provider: No ref. provider found Primary Care Physician:  Louis Meckel, MD Primary Gastroenterologist:  Dr. Olevia Perches  Reason for Consultation:  Heme positive stool  HPI: Alexander Grant. is a 60 y.o. 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  Patient was at dialysis this morning when he had an episode of nausea and vomiting around 0730. The emesis was red in color and the patient states that he had cranberry juice this AM so he thought it was from that, however, it was apparently heme positive so they suggested he come to the ED. He states he also had an episode of vomiting last evening without signs of blood in his vomit. He states he has been experiencing intermittent dull and sometimes sharp upper abdominal pains for the past 2 weeks and his appetite has been poor.  Also feels fatigued.  Has abdominal distention from ascites fluid as seen on recent CT scan, which is making him feel full quickly when eating.  He denies blood in his stool or dark tarry stools.  He only received roughly one hour of dialysis this morning.  Hgb is 10.1 grams currently, which is apparently his baseline.  Denies NSAID use.  Is not on an acid medication at home and denies any reflux type symptoms.  Colonoscopy 08/2011 by Dr. Hilarie Fredrickson with several polyps removed (tubular adenomas and eroded inflammatory polyps) but prep was poor and it was recommended that he have a repeat colonoscopy in 6 months from that time, which he did not have performed.  EGD 08/2011 with reflux esophagitis and Hpylori negative gastritis (reactive gastropathy and focal ulceration on pathology).  Just of note, he just had a CT scan of the abdomen ane pelvis without contrast on 04/27/2015, which showed the following:   "IMPRESSION: Large volume abdominopelvic  ascites.  Small fat/fluid containing midline supraumbilical ventral hernia. Small fat/fluid containing right inguinal hernia.  Small moderate right pleural effusion.  Layering gallbladder sludge, without associated inflammatory changes.  Additional ancillary findings as above."   Past Medical History  Diagnosis Date  . Fournier gangrene     with peri-rectal/perineal abscess drainage  . PONV (postoperative nausea and vomiting)   . Wegener's granulomatosis with renal involvement ([place])   . Anemia   . History of blood transfusion 08/2011  . Lower GI bleeding 10/01/2011  . Cocaine abuse 2013  . Tubular adenoma of colon   . Hyponatremia   . Acute renal failure ([place])   . Pulmonary infiltrate   . Reflux esophagitis   . Diverticulosis   . Right thyroid nodule   . Cardiomegaly   . Renal disorder     Dialysis  . Kidney stone   . Hypertension     Past Surgical History  Procedure Laterality Date  . Esophagogastroduodenoscopy  09/10/2011    Procedure: ESOPHAGOGASTRODUODENOSCOPY (EGD);  Surgeon: Jerene Bears, MD;  Location: [place];  Service: Gastroenterology;  Laterality: N/A;  . Colonoscopy  09/11/2011    Procedure: COLONOSCOPY;  Surgeon: Jerene Bears, MD;  Location: [place];  Service: Gastroenterology;  Laterality: N/A;  . Knee arthroscopy w/ medial collateral ligament (mcl) repair  ~ 2000    left  . Knee arthroscopy w/ acl reconstruction  2002  . Shoulder open rotator cuff repair   09/1999    right  . Incision and drainage perirectal abscess  05/2008    '  for Fournier's gangrene"  . Kidney stone surgery  1990  . Vasectomy    . Fracture surgery Right     wrist and great toe  . Av fistula placement Left 02/07/2013    Procedure: ARTERIOVENOUS (AV) FISTULA CREATION- LEFT BRACHIOCEPHALIC ;  Surgeon: Rosetta Posner, MD;  Location: [place];  Service: Vascular;  Laterality: Left;  . Insertion of dialysis catheter N/A 02/07/2013    Procedure: INSERTION OF DIALYSIS CATHETER;   Surgeon: Rosetta Posner, MD;  Location: [place];  Service: Vascular;  Laterality: N/A;  . Anterior cruciate ligament repair    . Shoulder surgery Left     Prior to Admission medications   Medication Sig Start Date End Date Taking? Authorizing Provider  acetaminophen (TYLENOL) 500 MG tablet Take 1,000 mg by mouth every 6 (six) hours as needed for mild pain.   Yes Historical Provider, MD  cinacalcet (SENSIPAR) 30 MG tablet Take 60 mg by mouth every other day.    Yes Historical Provider, MD  RENAGEL 800 MG tablet Take 2,400 mg by mouth 3 (three) times daily. 03/12/15  Yes Historical Provider, MD    Current Facility-Administered Medications  Medication Dose Route Frequency Provider Last Rate Last Dose  . pantoprazole (PROTONIX) 80 mg in sodium chloride 0.9 % 100 mL IVPB  80 mg Intravenous Once Kalman Drape, PA 300 mL/hr at 05/08/15 1006 80 mg at 05/08/15 1006   Current Outpatient Prescriptions  Medication Sig Dispense Refill  . acetaminophen (TYLENOL) 500 MG tablet Take 1,000 mg by mouth every 6 (six) hours as needed for mild pain.    . cinacalcet (SENSIPAR) 30 MG tablet Take 60 mg by mouth every other day.     Marland Kitchen RENAGEL 800 MG tablet Take 2,400 mg by mouth 3 (three) times daily.  6    Allergies as of 05/08/2015 - Review Complete 05/08/2015  Allergen Reaction Noted  . Bee pollen Anaphylaxis, Shortness Of Breath, and Rash 10/01/2011  . Bee venom Anaphylaxis 08/01/2013  . Percocet [oxycodone-acetaminophen] Hives and Rash 09/01/2011  . Percocet [oxycodone-acetaminophen] Hives 08/01/2013    Family History  Problem Relation Age of Onset  . Stroke Mother   . Cancer Father     Social History   Social History  . Marital Status: Divorced    Spouse Name: N/A  . Number of Children: N/A  . Years of Education: N/A   Occupational History  . Not on file.   Social History Main Topics  . Smoking status: Never Smoker   . Smokeless tobacco: Not on file  . Alcohol Use: No     Comment:  10/01/2011 "last alcohol ~ 2012"  . Drug Use: No     Comment: as recent as 09/02/11 per toxicology screen at hospital  . Sexual Activity: Not Currently   Other Topics Concern  . Not on file   Social History Narrative   ** Merged History Encounter **        Review of Systems: Ten point ROS is O/W negative except as mentioned in HPI.  Physical Exam: Vital signs in last 24 hours: Temp:  [98.1 F (36.7 C)] 98.1 F (36.7 C) (04/11 0759) Pulse Rate:  [68-75] 68 (04/11 1000) Resp:  [17-22] 20 (04/11 1000) BP: (131-154)/(82-98) 151/84 mmHg (04/11 1000) SpO2:  [92 %-100 %] 100 % (04/11 1000)   General:  Alert, Well-developed, well-nourished, pleasant and cooperative in NAD Head:  Normocephalic and atraumatic. Eyes:  Sclera clear, no icterus.  Conjunctiva pink. Ears:  Normal auditory acuity. Mouth:  No deformity or lesions.   Lungs:  Clear throughout to auscultation.  No wheezes, crackles, or rhonchi.  Heart:  Regular rate and rhythm; no murmurs, clicks, rubs, or gallops. Abdomen:  Soft but distended with ascites fluid.  BS present.  Reducible ventral hernia noted.  Mild upper abdominal TTP. Rectal:  Deferred  Msk:  Symmetrical without gross deformities. Pulses:  Normal pulses noted. Extremities:  Without clubbing or edema. Neurologic:  Alert and oriented x 4;  grossly normal neurologically. Skin:  Intact without significant lesions or rashes. Psych:  Alert and cooperative. Normal mood and affect.  Lab Results:  Recent Labs  05/08/15 0035  WBC 4.7  HGB 10.1*  HCT 31.4*  PLT 101*   BMET  Recent Labs  05/08/15 0035  NA 139  K 5.0  CL 84*  CO2 37*  GLUCOSE 90  BUN 56*  CREATININE 16.55*  CALCIUM 9.5   LFT  Recent Labs  05/08/15 0035  PROT 6.7  ALBUMIN 2.6*  AST 20  ALT 9*  ALKPHOS 84  BILITOT 1.7*   IMPRESSION:  -60 year old male with 2 weeks of upper abdominal pain and 3 episodes of vomiting, one with questionable blood vs cranberry juice but was  gastro-occult positive.  Hgb stable at baseline.  EGD in 2013 showed reflux esophagitis; patient not on PPI at home and denies reflux symptoms.  ? If bleeding was from esophagitis vs MWT vs ulcer disease (does not use NSAID's and was Hpylori negative previously), etc. -ESRD on HD two days a week -Ventral hernia:  Reducible.  Seen by Dr. Marlou Starks as outpatient. -Ascites/abdominal distention  PLAN: -Will place on pantoprazole 40 mg IV BID for now. -EGD later today or tomorrow.  ZEHR, JESSICA D.  05/08/2015, 10:10 AM  Pager number [phone]     ATTENDING ADDENDUM: I have taken an interval history, reviewed the chart, and examined the patient. I agree with the Advanced Practitioner's note and impression.  Patient with ESRD on HD presenting with a few weeks of upper abdominal pain and a few episodes of vomiting - with ? Small volume hematemesis today. If he had bleeding from this it would appear to be minimal. Hgb at baseline. Will treat with PPI and offered the patient an upper endoscopy. I discussed the risks / benefits of endoscopy and sedation with him, and he wished to proceed. Scheduled for tomorrow, please call with changes in his status overnight, he's currently stable.  Of note, large volume ascites noted on last CT. He has some mild thrombocytopenia but liver does not appear cirrhosis. Unclear if this is from nephrotic syndrome? Would recommend diagnostic paracentesis at some point to further evaluate this issue.   Custer Cellar, MD [facility] Pager [phone]

## 2015-05-08 NOTE — ED Notes (Signed)
MD at bedside for admission

## 2015-05-08 NOTE — ED Notes (Signed)
PA at bedside for admission.

## 2015-05-08 NOTE — ED Notes (Signed)
IV Therapy RN here to de-access dialysis graft

## 2015-05-08 NOTE — Progress Notes (Signed)
Dialysis treatment completed.  1600 mL ultrafiltrated and net fluid removal 1100 mL.    Patient status unchanged. Lung sounds clear to ausculation in all fields. No edema. Cardiac: NSR.  Disconnected lines and removed needles.  Pressure held for 10 minutes and band aid/gauze dressing applied.  Report given to bedside RN, Nevin Bloodgood.  Initial goal of 4L net UF was not reached due to cramping per patient.

## 2015-05-08 NOTE — ED Notes (Addendum)
Pt presents via [facility] from dialysis center. They report pt just started his dialysis tx when he had 1 episode of vomiting dark coffee ground emesis. Pt reports he also vomited once yesterday but did not notice any blood. Pt states he did drink a large glass of cranberry juice this morning. Pt c/o abd pain around his umbilical hernia. Pt also states he has not had a BM x 3 days. Pt noted to have distended abd. EMS vitals 124/82 HR 76 RR 22 95% CBG 116 Pt also reported to be noncompliant with dialysis. His last treatment was 05/01/15

## 2015-05-08 NOTE — ED Provider Notes (Signed)
S: Alexander Grant. is a 60 y.o. male presents to the ED with generalized abd pain, decreasing appetite and heme + emesis reported at dialysis.  Pt does reports drinking cranberry juice this morning.  He has a hx of noncompliance with dialysis and was last seen on 4/4.  He was only able to complete 1 hour today before the vomiting began.  He is not anticoagulated.  Pt with hx of ventral hernia (recent abd CT in March 2017).    O:  General: Awake  HEENT: Atraumatic  Resp: Normal effort, hypoxia on room air  Cardiac: RRR, + murmur Abd: Nondistended, soft, ttp at the site of the hernia, but fully reducible; +fluid wave MSK: trace pitting edema, L>R, no erythema or induration Neuro:No focal weakness  Skin: pale    Results for orders placed or performed during the hospital encounter of 05/08/15  Comprehensive metabolic panel  Result Value Ref Range   Sodium 139 135 - 145 mmol/L   Potassium 5.0 3.5 - 5.1 mmol/L   Chloride 84 (L) 101 - 111 mmol/L   CO2 37 (H) 22 - 32 mmol/L   Glucose, Bld 90 65 - 99 mg/dL   BUN 56 (H) 6 - 20 mg/dL   Creatinine, Ser 16.55 (H) 0.61 - 1.24 mg/dL   Calcium 9.5 8.9 - 10.3 mg/dL   Total Protein 6.7 6.5 - 8.1 g/dL   Albumin 2.6 (L) 3.5 - 5.0 g/dL   AST 20 15 - 41 U/L   ALT 9 (L) 17 - 63 U/L   Alkaline Phosphatase 84 38 - 126 U/L   Total Bilirubin 1.7 (H) 0.3 - 1.2 mg/dL   GFR calc non Af Amer 3 (L) >60 mL/min   GFR calc Af Amer 3 (L) >60 mL/min   Anion gap 18 (H) 5 - 15  CBC  Result Value Ref Range   WBC 4.7 4.0 - 10.5 K/uL   RBC 3.30 (L) 4.22 - 5.81 MIL/uL   Hemoglobin 10.1 (L) 13.0 - 17.0 g/dL   HCT 31.4 (L) 39.0 - 52.0 %   MCV 95.2 78.0 - 100.0 fL   MCH 30.6 26.0 - 34.0 pg   MCHC 32.2 30.0 - 36.0 g/dL   RDW 16.3 (H) 11.5 - 15.5 %   Platelets 101 (L) 150 - 400 K/uL  POC occult blood, ED  Result Value Ref Range   Fecal Occult Bld POSITIVE (A) NEGATIVE  Type and screen [place]  Result Value Ref Range   ABO/RH(D) A POS     Antibody Screen NEG    Sample Expiration 05/11/2015    Ct Abdomen Pelvis Wo Contrast: 04/27/2015     IMPRESSION: Large volume abdominopelvic ascites. Small fat/fluid containing midline supraumbilical ventral hernia. Small fat/fluid containing right inguinal hernia. Small moderate right pleural effusion. Layering gallbladder sludge, without associated inflammatory changes. Additional ancillary findings as above. Electronically Signed   By: Julian Hy M.D.   On: 04/27/2015 15:44    EKG Interpretation  Date/Time:  Tuesday May 08 2015 08:17:23 EDT Ventricular Rate:  70 PR Interval:    QRS Duration: 124 QT Interval:  456 QTC Calculation: 492 R Axis:   -73 Text Interpretation:  Accelerated junctional rhythm Nonspecific IVCD with LAD Probable lateral infarct, age indeterminate Since previous tracing QT and QRS have lengthened Confirmed by Canary Brim  MD, MARTHA [phone]) on 05/08/2015 9:32:17 AM       A/P:  Pt with abd pain, heme + emesis and hypoxia.  Will  call positive. Anemia at baseline 10.1. Creatinine 16.55. Patient will need to finish dialysis. Anion gap of 18.  No evidence of incarcerated hernia.  CT scan from 3/31 personally reviewed by myself.  NO indication for repeat CT today as abd pain is non-focal.  Large volume ascites seen then and remains present on abd exam today.    BP 153/98 mmHg  Pulse 75  Temp(Src) 98.1 F (36.7 C) (Oral)  Resp 20  SpO2 100%   9:55 AM Discussed with Dr. Ronnald Ramp of [facility] who will admit to tele.  Also spoke to [employer], [place], PA-C who will evaluate in the ED.   Pt was seen by Jackson Latino, PA-C and personally evaluated by myself with Alfonzo Beers, MD supervising.      Jarrett Soho Savior Himebaugh, PA-C 05/08/15 [place], MD 05/08/15 1004

## 2015-05-08 NOTE — ED Provider Notes (Signed)
CSN: [id number]     Arrival date & time 05/08/15  0743 History   First MD Initiated Contact with Patient 05/08/15 [phone]     Chief Complaint  Patient presents with  . coffee ground emesis/dialysis patient      (Consider location/radiation/quality/duration/timing/severity/associated sxs/prior Treatment) HPI Comments: Patient is a 60 y/o male with a history of renal failure, colon cancer, anemia, Wegener's granulomatosis with renal involvement, lower GI bleed, gastritis and espohagitis, and HTN presents to the ED with one day of hematemesis. Patient was at dialysis this morning when he had an episode of nausea and vomiting around 0730. The dialysis center stated his emesis was heme positive so they suggested he come to the ED. He states he also had an episode of vomiting last evening without signs of blood in his vomit. He states he has been experiencing intermittent dull and sometimes sharp abdominal pain that radiates to his right side with associated bloating for 2 weeks. He states the pain is worse with eating, bending over or lying down. He stated his belly looks like he is pregnant. He states for 2 weeks he has been feeling full quickly when he eats and that he has not been able to eat much. He states he lost 10lbs in the past week. He has been feeling weak and fatigued for 2 weeks. He denies blood in or dark tary stools, fever, chills, CP , SOB, diarrhea. He has been noncompliant with dialysis. His days are Tuesdays and Saturdays and his last dialysis was on 4/4. He only received roughly one hour of dialysis this morning. He is being seening Dr.Teoh for a ventral hernia. He had a CT abddomin pelvis with contrast on 3/31. I reviewed the results of the CT which revealed large volume abdominopelvic ascites, small fat/fluid containing midline supraumbilical ventral hernia, small fat/fluid containing right inguinal hernia, small moderate right pleural effusion, layering gallbladder sludge, without associated  inflammatory changes.   The history is provided by the patient.    Past Medical History  Diagnosis Date  . Fournier gangrene     with peri-rectal/perineal abscess drainage  . PONV (postoperative nausea and vomiting)   . Wegener's granulomatosis with renal involvement ([place])   . Anemia   . History of blood transfusion 08/2011  . Lower GI bleeding 10/01/2011  . Cocaine abuse 2013  . Tubular adenoma of colon   . Hyponatremia   . Acute renal failure ([place])   . Pulmonary infiltrate   . Reflux esophagitis   . Diverticulosis   . Right thyroid nodule   . Cardiomegaly   . Renal disorder     Dialysis  . Kidney stone   . Hypertension    Past Surgical History  Procedure Laterality Date  . Esophagogastroduodenoscopy  09/10/2011    Procedure: ESOPHAGOGASTRODUODENOSCOPY (EGD);  Surgeon: Jerene Bears, MD;  Location: [place];  Service: Gastroenterology;  Laterality: N/A;  . Colonoscopy  09/11/2011    Procedure: COLONOSCOPY;  Surgeon: Jerene Bears, MD;  Location: [place];  Service: Gastroenterology;  Laterality: N/A;  . Knee arthroscopy w/ medial collateral ligament (mcl) repair  ~ 2000    left  . Knee arthroscopy w/ acl reconstruction  2002  . Shoulder open rotator cuff repair   09/1999    right  . Incision and drainage perirectal abscess  05/2008    'for Fournier's gangrene"  . Kidney stone surgery  1990  . Vasectomy    . Fracture surgery Right     wrist and  great toe  . Av fistula placement Left 02/07/2013    Procedure: ARTERIOVENOUS (AV) FISTULA CREATION- LEFT BRACHIOCEPHALIC ;  Surgeon: Rosetta Posner, MD;  Location: [place];  Service: Vascular;  Laterality: Left;  . Insertion of dialysis catheter N/A 02/07/2013    Procedure: INSERTION OF DIALYSIS CATHETER;  Surgeon: Rosetta Posner, MD;  Location: [place];  Service: Vascular;  Laterality: N/A;  . Anterior cruciate ligament repair    . Shoulder surgery Left    Family History  Problem Relation Age of Onset  . Stroke Mother   . Cancer  Father    Social History  Substance Use Topics  . Smoking status: Never Smoker   . Smokeless tobacco: None  . Alcohol Use: No     Comment: 10/01/2011 "last alcohol ~ 2012"    Review of Systems  Constitutional: Positive for fatigue. Negative for fever and chills.  HENT: Negative for trouble swallowing.   Respiratory: Negative for cough, chest tightness and shortness of breath.   Cardiovascular: Positive for leg swelling. Negative for chest pain.  Gastrointestinal: Positive for nausea, vomiting, abdominal pain and abdominal distention. Negative for diarrhea, constipation and blood in stool.  Genitourinary: Negative for dysuria, hematuria and difficulty urinating.  Musculoskeletal: Negative for myalgias, back pain and arthralgias.  Skin: Negative for rash.  Neurological: Positive for weakness. Negative for dizziness, syncope and light-headedness.  Hematological: Does not bruise/bleed easily.  Psychiatric/Behavioral: Negative for behavioral problems, confusion and agitation.      Allergies  Bee pollen; Bee venom; Percocet; and Percocet  Home Medications   Prior to Admission medications   Medication Sig Start Date End Date Taking? Authorizing Provider  acetaminophen (TYLENOL) 500 MG tablet Take 1,000 mg by mouth every 6 (six) hours as needed for mild pain.   Yes Historical Provider, MD  cinacalcet (SENSIPAR) 30 MG tablet Take 60 mg by mouth every other day.    Yes Historical Provider, MD  RENAGEL 800 MG tablet Take 2,400 mg by mouth 3 (three) times daily. 03/12/15  Yes Historical Provider, MD   BP 131/82 mmHg  Pulse 71  Temp(Src) 98.1 F (36.7 C) (Oral)  Resp 18  SpO2 92% Physical Exam  Constitutional: He is oriented to person, place, and time. He appears well-nourished. He is cooperative. No distress. Nasal cannula in place.  HENT:  Head: Normocephalic and atraumatic.  Oropharynx pale  Eyes: Conjunctivae are normal.  Neck: Normal range of motion.  Cardiovascular: Normal  rate and regular rhythm.   Murmur heard.  Systolic murmur is present with a grade of 3/6  Pulmonary/Chest: Effort normal.  Abdominal: He exhibits distension, fluid wave and ascites. Bowel sounds are decreased. There is generalized tenderness. A hernia is present. Hernia confirmed positive in the ventral area.  Easily reducible ventral hernia roughly 3cm superior to the umbilicus, tenderness elicited with reduction  Musculoskeletal: Normal range of motion. He exhibits edema.  Trace pitting edema of bilaterally lower extremities greater in the left leg than the right  Neurological: He is alert and oriented to person, place, and time.  Skin: Skin is warm and dry. No rash noted. He is not diaphoretic. There is pallor.  Pale nail beds    ED Course  Procedures (including critical care time) Labs Review Labs Reviewed  COMPREHENSIVE METABOLIC PANEL - Abnormal; Notable for the following:    Chloride 84 (*)    CO2 37 (*)    BUN 56 (*)    Creatinine, Ser 16.55 (*)    Albumin 2.6 (*)  ALT 9 (*)    Total Bilirubin 1.7 (*)    GFR calc non Af Amer 3 (*)    GFR calc Af Amer 3 (*)    Anion gap 18 (*)    All other components within normal limits  CBC - Abnormal; Notable for the following:    RBC 3.30 (*)    Hemoglobin 10.1 (*)    HCT 31.4 (*)    RDW 16.3 (*)    Platelets 101 (*)    All other components within normal limits  POC OCCULT BLOOD, ED - Abnormal; Notable for the following:    Fecal Occult Bld POSITIVE (*)    All other components within normal limits  URINE RAPID DRUG SCREEN, HOSP PERFORMED  LIPASE, BLOOD  TYPE AND SCREEN     EKG Interpretation   Date/Time:  Tuesday May 08 2015 08:17:23 EDT Ventricular Rate:  70 PR Interval:    QRS Duration: 124 QT Interval:  456 QTC Calculation: 492 R Axis:   -73 Text Interpretation:  Accelerated junctional rhythm Nonspecific IVCD with  LAD Probable lateral infarct, age indeterminate Since previous tracing QT  and QRS have  lengthened Confirmed by Canary Brim  MD, MARTHA [phone]) on  05/08/2015 9:32:17 AM      MDM   Final diagnoses:  End stage renal disease (HCC)  Hematest positive stools  Gastrointestinal hemorrhage, unspecified gastritis, unspecified gastrointestinal hemorrhage type  Hypoxia    The patient is anemic with a HGB of 10.1 and a HCT of 31.4. His fecal occult was positive and his chloride is 84. The patient presented with O2 saturation of 88 and when placed on 2L of O2 his sats reached 100%. Patient does have use O2 at home. The patient has a history of gastritis and esophagitis in 2013 therefore his positive occult and hematemesis could be from gastritis, esophagitis, bleeding GI ulcer, gastric carcinoma. Gi was consulted and spoke with [facility] Muthersbaugh PA-C. Patient is not anticoagulated.  He will be admitted to the internal medicine teaching service for his ascites, hypoxia, anemia, abdominal pain, hematemesis and positive occult blood.  1037: checked on the patient and he states his pain is well controlled and he is not nauseated.    Kalman Drape, PA 05/08/15 [place], MD 05/08/15 [phone]

## 2015-05-09 ENCOUNTER — Inpatient Hospital Stay (HOSPITAL_COMMUNITY): Payer: Medicaid Other | Admitting: Anesthesiology

## 2015-05-09 ENCOUNTER — Encounter (HOSPITAL_COMMUNITY)
Admission: EM | Disposition: A | Discharge status: Home / Self Care | Payer: Self-pay | Source: Home / Self Care | Attending: Oncology

## 2015-05-09 ENCOUNTER — Encounter (HOSPITAL_COMMUNITY): Payer: Self-pay | Admitting: *Deleted

## 2015-05-09 DIAGNOSIS — K766 Portal hypertension: Secondary | ICD-10-CM | POA: Diagnosis present

## 2015-05-09 DIAGNOSIS — Z823 Family history of stroke: Secondary | ICD-10-CM | POA: Diagnosis not present

## 2015-05-09 DIAGNOSIS — K4090 Unilateral inguinal hernia, without obstruction or gangrene, not specified as recurrent: Secondary | ICD-10-CM | POA: Diagnosis present

## 2015-05-09 DIAGNOSIS — Q2733 Arteriovenous malformation of digestive system vessel: Secondary | ICD-10-CM | POA: Diagnosis not present

## 2015-05-09 DIAGNOSIS — Z8719 Personal history of other diseases of the digestive system: Secondary | ICD-10-CM | POA: Diagnosis not present

## 2015-05-09 DIAGNOSIS — J90 Pleural effusion, not elsewhere classified: Secondary | ICD-10-CM | POA: Diagnosis present

## 2015-05-09 DIAGNOSIS — I1311 Hypertensive heart and chronic kidney disease without heart failure, with stage 5 chronic kidney disease, or end stage renal disease: Secondary | ICD-10-CM | POA: Diagnosis present

## 2015-05-09 DIAGNOSIS — R634 Abnormal weight loss: Secondary | ICD-10-CM | POA: Diagnosis present

## 2015-05-09 DIAGNOSIS — K439 Ventral hernia without obstruction or gangrene: Secondary | ICD-10-CM | POA: Diagnosis present

## 2015-05-09 DIAGNOSIS — Z91030 Bee allergy status: Secondary | ICD-10-CM | POA: Diagnosis not present

## 2015-05-09 DIAGNOSIS — N058 Unspecified nephritic syndrome with other morphologic changes: Secondary | ICD-10-CM | POA: Diagnosis not present

## 2015-05-09 DIAGNOSIS — N059 Unspecified nephritic syndrome with unspecified morphologic changes: Secondary | ICD-10-CM | POA: Diagnosis present

## 2015-05-09 DIAGNOSIS — K226 Gastro-esophageal laceration-hemorrhage syndrome: Secondary | ICD-10-CM | POA: Diagnosis present

## 2015-05-09 DIAGNOSIS — Z992 Dependence on renal dialysis: Secondary | ICD-10-CM | POA: Diagnosis not present

## 2015-05-09 DIAGNOSIS — Z9119 Patient's noncompliance with other medical treatment and regimen: Secondary | ICD-10-CM | POA: Diagnosis not present

## 2015-05-09 DIAGNOSIS — K2970 Gastritis, unspecified, without bleeding: Secondary | ICD-10-CM | POA: Diagnosis present

## 2015-05-09 DIAGNOSIS — Z6821 Body mass index (BMI) 21.0-21.9, adult: Secondary | ICD-10-CM | POA: Diagnosis not present

## 2015-05-09 DIAGNOSIS — K922 Gastrointestinal hemorrhage, unspecified: Secondary | ICD-10-CM

## 2015-05-09 DIAGNOSIS — D649 Anemia, unspecified: Secondary | ICD-10-CM | POA: Diagnosis present

## 2015-05-09 DIAGNOSIS — Z9115 Patient's noncompliance with renal dialysis: Secondary | ICD-10-CM | POA: Diagnosis not present

## 2015-05-09 DIAGNOSIS — K449 Diaphragmatic hernia without obstruction or gangrene: Secondary | ICD-10-CM | POA: Diagnosis present

## 2015-05-09 DIAGNOSIS — K210 Gastro-esophageal reflux disease with esophagitis: Secondary | ICD-10-CM | POA: Diagnosis present

## 2015-05-09 DIAGNOSIS — K317 Polyp of stomach and duodenum: Secondary | ICD-10-CM | POA: Diagnosis present

## 2015-05-09 DIAGNOSIS — I120 Hypertensive chronic kidney disease with stage 5 chronic kidney disease or end stage renal disease: Secondary | ICD-10-CM | POA: Diagnosis not present

## 2015-05-09 DIAGNOSIS — K209 Esophagitis, unspecified: Secondary | ICD-10-CM | POA: Diagnosis not present

## 2015-05-09 DIAGNOSIS — N051 Unspecified nephritic syndrome with focal and segmental glomerular lesions: Secondary | ICD-10-CM

## 2015-05-09 DIAGNOSIS — D696 Thrombocytopenia, unspecified: Secondary | ICD-10-CM | POA: Diagnosis present

## 2015-05-09 DIAGNOSIS — Z885 Allergy status to narcotic agent status: Secondary | ICD-10-CM | POA: Diagnosis not present

## 2015-05-09 DIAGNOSIS — R188 Other ascites: Secondary | ICD-10-CM | POA: Diagnosis present

## 2015-05-09 DIAGNOSIS — N186 End stage renal disease: Secondary | ICD-10-CM | POA: Diagnosis present

## 2015-05-09 DIAGNOSIS — Z87892 Personal history of anaphylaxis: Secondary | ICD-10-CM | POA: Diagnosis not present

## 2015-05-09 DIAGNOSIS — M3131 Wegener's granulomatosis with renal involvement: Secondary | ICD-10-CM | POA: Diagnosis present

## 2015-05-09 DIAGNOSIS — F1490 Cocaine use, unspecified, uncomplicated: Secondary | ICD-10-CM | POA: Diagnosis present

## 2015-05-09 DIAGNOSIS — K920 Hematemesis: Secondary | ICD-10-CM | POA: Diagnosis present

## 2015-05-09 HISTORY — PX: ESOPHAGOGASTRODUODENOSCOPY: SHX5428

## 2015-05-09 LAB — COMPREHENSIVE METABOLIC PANEL
ALT: 8 U/L — AB (ref 17–63)
AST: 18 U/L (ref 15–41)
Albumin: 2.3 g/dL — ABNORMAL LOW (ref 3.5–5.0)
Alkaline Phosphatase: 79 U/L (ref 38–126)
Anion gap: 16 — ABNORMAL HIGH (ref 5–15)
BILIRUBIN TOTAL: 2.4 mg/dL — AB (ref 0.3–1.2)
BUN: 31 mg/dL — ABNORMAL HIGH (ref 6–20)
CHLORIDE: 94 mmol/L — AB (ref 101–111)
CO2: 31 mmol/L (ref 22–32)
Calcium: 9.2 mg/dL (ref 8.9–10.3)
Creatinine, Ser: 10.81 mg/dL — ABNORMAL HIGH (ref 0.61–1.24)
GFR, EST AFRICAN AMERICAN: 5 mL/min — AB (ref >60)
GFR, EST NON AFRICAN AMERICAN: 4 mL/min — AB (ref >60)
Glucose, Bld: 82 mg/dL (ref 65–99)
Potassium: 4.7 mmol/L (ref 3.5–5.1)
Sodium: 141 mmol/L (ref 135–145)
Total Protein: 6.4 g/dL — ABNORMAL LOW (ref 6.5–8.1)

## 2015-05-09 LAB — CBC
HEMATOCRIT: 30.4 % — AB (ref 39.0–52.0)
Hemoglobin: 9.6 g/dL — ABNORMAL LOW (ref 13.0–17.0)
MCH: 30.6 pg (ref 26.0–34.0)
MCHC: 31.6 g/dL (ref 30.0–36.0)
MCV: 96.8 fL (ref 78.0–100.0)
PLATELETS: 81 10*3/uL — AB (ref 150–400)
RBC: 3.14 MIL/uL — AB (ref 4.22–5.81)
RDW: 16.6 % — AB (ref 11.5–15.5)
WBC: 4.7 10*3/uL (ref 4.0–10.5)

## 2015-05-09 LAB — URINALYSIS, ROUTINE W REFLEX MICROSCOPIC
BILIRUBIN URINE: NEGATIVE
Glucose, UA: NEGATIVE mg/dL
KETONES UR: NEGATIVE mg/dL
Leukocytes, UA: NEGATIVE
NITRITE: NEGATIVE
Protein, ur: 100 mg/dL — AB
Specific Gravity, Urine: 1.011 (ref 1.005–1.030)
pH: 8.5 — ABNORMAL HIGH (ref 5.0–8.0)

## 2015-05-09 LAB — RAPID URINE DRUG SCREEN, HOSP PERFORMED
Amphetamines: NOT DETECTED
BARBITURATES: NOT DETECTED
Benzodiazepines: NOT DETECTED
COCAINE: POSITIVE — AB
Opiates: NOT DETECTED
TETRAHYDROCANNABINOL: NOT DETECTED

## 2015-05-09 LAB — PROTEIN / CREATININE RATIO, URINE
CREATININE, URINE: 52.08 mg/dL
PROTEIN CREATININE RATIO: 3.44 mg/mg{creat} — AB (ref 0.00–0.15)
TOTAL PROTEIN, URINE: 179 mg/dL

## 2015-05-09 LAB — URINE MICROSCOPIC-ADD ON

## 2015-05-09 LAB — GLUCOSE, CAPILLARY
GLUCOSE-CAPILLARY: 60 mg/dL — AB (ref 65–99)
GLUCOSE-CAPILLARY: 79 mg/dL (ref 65–99)

## 2015-05-09 SURGERY — EGD (ESOPHAGOGASTRODUODENOSCOPY)
Anesthesia: Monitor Anesthesia Care

## 2015-05-09 MED ORDER — PROPOFOL 500 MG/50ML IV EMUL
INTRAVENOUS | Status: DC | PRN
  Administered 2015-05-09: 75 ug/kg/min via INTRAVENOUS

## 2015-05-09 MED ORDER — NEPRO/CARBSTEADY PO LIQD
237.0000 mL | Freq: Three times a day (TID) | ORAL | Status: DC
  Administered 2015-05-09 – 2015-05-12 (×5): 237 mL via ORAL

## 2015-05-09 MED ORDER — FENTANYL CITRATE (PF) 100 MCG/2ML IJ SOLN
12.5000 ug | Freq: Once | INTRAMUSCULAR | Status: AC
Start: 2015-05-09 — End: 2015-05-09
  Administered 2015-05-09: 12.5 ug via INTRAVENOUS

## 2015-05-09 MED ORDER — SODIUM CHLORIDE 0.9 % IV SOLN
INTRAVENOUS | Status: DC | PRN
  Administered 2015-05-09: 14:00:00 via INTRAVENOUS

## 2015-05-09 MED ORDER — DEXTROSE 50 % IV SOLN
25.0000 mL | Freq: Once | INTRAVENOUS | Status: AC
  Administered 2015-05-09: 25 mL via INTRAVENOUS

## 2015-05-09 MED ORDER — DEXTROSE 50 % IV SOLN
INTRAVENOUS | Status: AC
  Filled 2015-05-09: qty 50

## 2015-05-09 MED ORDER — HYDROCORTISONE 1 % EX CREA
TOPICAL_CREAM | Freq: Four times a day (QID) | CUTANEOUS | Status: DC
  Administered 2015-05-09: 11:00:00 via TOPICAL
  Administered 2015-05-09: 1 via TOPICAL
  Administered 2015-05-09 – 2015-05-10 (×2): via TOPICAL
  Administered 2015-05-10: 1 via TOPICAL
  Administered 2015-05-10 – 2015-05-12 (×6): via TOPICAL
  Filled 2015-05-09 (×4): qty 28

## 2015-05-09 MED ORDER — PROPOFOL 10 MG/ML IV BOLUS
INTRAVENOUS | Status: DC | PRN
Start: 2015-05-09 — End: 2015-05-09
  Administered 2015-05-09 (×2): 10 mg via INTRAVENOUS

## 2015-05-09 NOTE — Transfer of Care (Signed)
Immediate Anesthesia Transfer of Care Note  Patient: Alexander Grant.  Procedure(s) Performed: Procedure(s): ESOPHAGOGASTRODUODENOSCOPY (EGD) (N/A)  Patient Location: PACU and Endoscopy Unit  Anesthesia Type:MAC  Level of Consciousness: awake, alert  and oriented  Airway & Oxygen Therapy: Patient Spontanous Breathing and Patient connected to nasal cannula oxygen  Post-op Assessment: Report given to RN and Post -op Vital signs reviewed and stable  Post vital signs: Reviewed and stable  Last Vitals:  Filed Vitals:   05/09/15 1348 05/09/15 1535  BP: 152/97 115/78  Pulse:  73  Temp:  36.7 C  Resp: 19 20    Complications: No apparent anesthesia complications

## 2015-05-09 NOTE — Op Note (Signed)
[facility] Patient Name: Alexander Grant Procedure Date : 05/09/2015 MRN: [id number] Attending MD: Carlota Raspberry. Elsia Lasota MD, MD Date of Birth: Oct 04, 1955 CSN: [id number] Age: 60 Admit Type: Inpatient Procedure:                Upper GI endoscopy Indications:              Hematemesis, patient with ESRD on HD, also with                            ascites of unclear etiology Providers:                Alexander Lipps P. Adair Lauderback MD, MD, Laverta Baltimore, RN,                            Despina Pole, Technician Referring MD:              Medicines:                Monitored Anesthesia Care Complications:            No immediate complications. Estimated blood loss:                            Minimal. Estimated Blood Loss:     Estimated blood loss was minimal. Procedure:                Pre-Anesthesia Assessment:                           - Prior to the procedure, a History and Physical                            was performed, and patient medications and                            allergies were reviewed. The patient's tolerance of                            previous anesthesia was also reviewed. The risks                            and benefits of the procedure and the sedation                            options and risks were discussed with the patient.                            All questions were answered, and informed consent                            was obtained. Prior Anticoagulants: The patient has                            taken no previous anticoagulant or antiplatelet  agents. ASA Grade Assessment: III - A patient with                            severe systemic disease. After reviewing the risks                            and benefits, the patient was deemed in                            satisfactory condition to undergo the procedure.                           After obtaining informed consent, the endoscope was                            passed  under direct vision. Throughout the                            procedure, the patient's blood pressure, pulse, and                            oxygen saturations were monitored continuously. The                            [id number]) scope was introduced through the                            mouth, and advanced to the second part of duodenum.                            The upper GI endoscopy was accomplished without                            difficulty. The patient tolerated the procedure                            well. Scope In: Scope Out: Findings:      Esophagogastric landmarks were identified: the Z-line was found at 35       cm, the gastroesophageal junction was found at 35 cm and the site of       hiatal narrowing was found at 41 cm from the incisors.      A 6 cm hiatal hernia was present.      Severe esophagitis with no bleeding was found from the GEJ to 25cm from       the incisors. Some white plaques were also noted. Brushings obtained to       rule out candidiasis. No esophageal varices were appreciated. Barrett's       is possible although due to the inflammation this could not be       accurately evaluated for.      A localized what I suspect is a prominent gastric fold versus gastric       polyp was found at the pylorus. Biopsies were taken with a cold forceps       for histology. No ulceration appreciated.  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      The exam of the stomach was otherwise normal.      The duodenal bulb was normal.      A single 3 mm sessile polyp was found in the second portion of the       duodenum. The polyp was removed with a cold biopsy forceps. Resection       and retrieval were complete.      The exam of the duodenum was otherwise normal. Impression:               - Esophagogastric landmarks identified.                           - 6 cm hiatal hernia.                            - Severe reflux esophagitis vs. candida                            esophagitis, which I suspect is the likely cause of                            the patient's symptoms. Brushings obtained to rule                            out Candidiasis.                           - Suspected enlarged gastric fold vs. gastric                            polyp. Biopsied.                           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                           - Normal duodenal bulb.                           - A single duodenal polyp. Resected and retrieved.                           Overall, suspect severe esophagitis is the most                            likely cause for the patient's symptoms. Moderate Sedation:      no moderate sedation performed Recommendation:           - Return patient to hospital ward for ongoing care.                           - Soft  diet as tolerated.                           - Continue present medications.                           - Protonix or omeprazole 40mg  PO BID                           - NO NSAIDs                           - Await pathology results.                           - Paracentesis per IR to further evaluate etiology                            of ascites Procedure Code(s):        --- Professional ---                           [phone], Esophagogastroduodenoscopy, flexible,                            transoral; with biopsy, single or multiple Diagnosis Code(s):        --- Professional ---                           K44.9, Diaphragmatic hernia without obstruction or                            gangrene                           K21.0, Gastro-esophageal reflux disease with                            esophagitis                           K29.60, Other gastritis without bleeding                           K31.819, Angiodysplasia of stomach and duodenum                             without bleeding                           K31.7, Polyp of stomach and duodenum                           K92.0, Hematemesis CPT copyright 2016 American Medical Association. All rights reserved. The codes documented in this report are preliminary and upon coder review may  be revised to meet current compliance requirements. Alexander Lipps P. Mashawn Brazil MD, MD 05/09/2015 3:35:06 PM This report has been signed electronically. Number of Addenda: 0

## 2015-05-09 NOTE — Progress Notes (Signed)
Hypoglycemic Event  CBG: 60  Treatment: D50 IV 25 mL   Symptoms:  cold, clammy  Follow-up CBG: Time: 410pm CBG Result: 79  Possible Reasons for Event: Inadequate meal intake  Comments/MD notified: Attempted to notify anesthesia MD; Dr. Ola Spurr unavailable    Alexander Grant

## 2015-05-09 NOTE — Progress Notes (Signed)
Subjective:  Patient reports that his nausea has improved significantly, no episodes of vomiting. He endorses some persistent subjective dyspnea. He denies any recent alcohol use.   Objective: Vital signs in last 24 hours: Filed Vitals:   05/09/15 0000 05/09/15 0030 05/09/15 0435 05/09/15 0928  BP:   144/86 124/81  Pulse:   76 81  Temp:   98.6 F (37 C) 98.4 F (36.9 C)  TempSrc:   Oral Oral  Resp: 18 20 17 18   Height:      Weight:      SpO2: 86% 94% 97% 93%   Weight change:   Intake/Output Summary (Last 24 hours) at 05/09/15 1325 Last data filed at 05/09/15 1100  Gross per 24 hour  Intake    360 ml  Output   1100 ml  Net   -740 ml   Physical Exam General: Lying in bed, NAD. Cachectic appearing. HEENT: MMM, no scleral icterus Cardiovascular: RRR, 3/6 holosystolic murmur when lying on right side Pulmonary: Fine crackles at the bases. Unlabored breathing Abdominal: +fluid wave, distended Extremities: No lower extremity edema. No clubbing Neurological: Alert. Speech normal Psychiatric: Normal behavior and affect.   Lab Results: Basic Metabolic Panel:  Recent Labs Lab 05/08/15 0035 05/09/15 0651  NA 139 141  K 5.0 4.7  CL 84* 94*  CO2 37* 31  GLUCOSE 90 82  BUN 56* 31*  CREATININE 16.55* 10.81*  CALCIUM 9.5 9.2   Liver Function Tests:  Recent Labs Lab 05/08/15 0035 05/09/15 0651  AST 20 18  ALT 9* 8*  ALKPHOS 84 79  BILITOT 1.7* 2.4*  PROT 6.7 6.4*  ALBUMIN 2.6* 2.3*    Recent Labs Lab 05/08/15 0035  LIPASE 34   CBC:  Recent Labs Lab 05/08/15 0035 05/09/15 0651  WBC 4.7 4.7  HGB 10.1* 9.6*  HCT 31.4* 30.4*  MCV 95.2 96.8  PLT 101* 81*   Coagulation:  Recent Labs Lab 05/08/15 1500  LABPROT 15.9*  INR 1.25    Micro Results: No results found for this or any previous visit (from the past 240 hour(s)). Studies/Results: X-ray Chest Pa And Lateral  05/08/2015  CLINICAL DATA:  60 year old male with vomiting today during  dialysis. Hypoxia and cough. Initial encounter. EXAM: CHEST  2 VIEW COMPARISON:  [facility] CT Abdomen and Pelvis 04/27/2015 Report of chest radiograph 02/07/2013 (no images available). FINDINGS: Low lung volumes with continued small to moderate right pleural effusion. No superimposed pneumothorax, pulmonary edema, or confluent pulmonary opacity. Stable cardiomegaly and mediastinal contours. Visualized tracheal air column is within normal limits. No acute osseous abnormality identified. Calcified aortic atherosclerosis. Gray hazy opacity throughout the abdomen compatible with the large volume ascites seen recently by CT. IMPRESSION: 1. Small to moderate right pleural effusion appears stable since 04/27/2015. 2. No new cardiopulmonary abnormality. 3. Ascites. Electronically Signed   By: Genevie Ann M.D.   On: 05/08/2015 11:41   Medications: I have reviewed the patient's current medications. Scheduled Meds: . darbepoetin (ARANESP) injection - DIALYSIS  200 mcg Intravenous Q Tue-HD  . ferric gluconate (FERRLECIT/NULECIT) IV  125 mg Intravenous Q T,Th,Sa-HD  . hydrocortisone cream   Topical QID  . pantoprazole (PROTONIX) IV  40 mg Intravenous Q12H   Continuous Infusions: . sodium chloride 1,000 mL (05/08/15 2301)   PRN Meds:.ondansetron **OR** ondansetron (ZOFRAN) IV Assessment/Plan:  Nausea and Vomiting: Improved today. Heme positive vomit likely attributable to Mallory-Weiss tear, perhaps in the setting of gastroenteritis. Hgb is stable from baseline. Patient has history  of gastritis and esophagitis in 2013. GI consulted and he is scheduled for an EGD today.  - Protonix 40 mg IV BID - Zofran prn  Ascites: Significant ascites on exam and imaging. No evidence of cirrhosis on imaging- normal INR and LFTs. Low albumin invokes possibility of nephrotic syndrome. This is likely leading to his subjective dyspnea, with low lung volumes on CXR.  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 Received HD on day of admission - Possibly HD this afternoon after EGD  DVT/PE PPx: SCD's  Dispo: Disposition is deferred at this time, awaiting improvement of current medical problems.  Anticipated discharge in approximately 2-3 day(s).   The patient does have a current PCP Corliss Parish, MD) and does not need an [facility] hospital follow-up appointment after discharge.  The patient does have transportation limitations that hinder transportation to clinic appointments.  .Services Needed at time of discharge: Y = Yes, Blank = No PT:   OT:   RN:   Equipment:   Other:     LOS: 1 day   Liberty Handy, MD 05/09/2015, 1:25 PM

## 2015-05-09 NOTE — Anesthesia Postprocedure Evaluation (Signed)
Anesthesia Post Note  Patient: Alexander Grant.  Procedure(s) Performed: Procedure(s) (LRB): ESOPHAGOGASTRODUODENOSCOPY (EGD) (N/A)  Patient location during evaluation: PACU Anesthesia Type: MAC Level of consciousness: awake and alert Pain management: pain level controlled Vital Signs Assessment: post-procedure vital signs reviewed and stable Respiratory status: spontaneous breathing, nonlabored ventilation, respiratory function stable and patient connected to nasal cannula oxygen Cardiovascular status: stable and blood pressure returned to baseline Anesthetic complications: no    Last Vitals:  Filed Vitals:   05/09/15 1610 05/09/15 1650  BP: 150/82 153/78  Pulse: 72 70  Temp:  36.4 C  Resp: 21 17    Last Pain:  Filed Vitals:   05/09/15 1725  PainSc: 7                  Elaya Droege J

## 2015-05-09 NOTE — Progress Notes (Signed)
Initial Nutrition Assessment  DOCUMENTATION CODES:   Not applicable  INTERVENTION:  -Nepro Shake po TID, each supplement provides 425 kcal and 19 grams protein -RD continue to monitor  NUTRITION DIAGNOSIS:   Inadequate oral intake related to poor appetite, chronic illness, social / environmental circumstances as evidenced by per patient/family report.  GOAL:   Patient will meet greater than or equal to 90% of their needs  MONITOR:   PO intake, Supplement acceptance, Labs, I & O's  REASON FOR ASSESSMENT:   Malnutrition Screening Tool    ASSESSMENT:   [name]. is a 60 y.o. male w/ PMHx of HTN, ESRD on HD (TTS), with h/o renal biopsy significant for pauci-immune GN, and h/o substance abuse, presents to the ED after having witnessed hematemesis at HD today. Patient states he has not been feeling well for the past week or so and has had a very poor appetite and has lost 10 lbs over the past week  Patient was in endoscopy during visit. Presents w/ history of substance abuse, hematemesis of unclear etiology during dialysis. Per chart pt's appetite decreased along with reported 10# wt loss in 1 wk.  EGD done to determine etiology, suspected severe esophagitis as cause of his hematemesis.  Unable to complete Nutrition-Focused physical exam at this time.   Assess patient for snacks, NePro acceptance.  Labs and Medications reviewed.  Diet Order:  DIET SOFT Room service appropriate?: Yes; Fluid consistency:: Thin  Skin:  Reviewed, no issues  Last BM:  4/10  Height:   Ht Readings from Last 1 Encounters:  05/08/15 6' (1.829 m)    Weight:   Wt Readings from Last 1 Encounters:  05/08/15 173 lb 1 oz (78.5 kg)    Ideal Body Weight:  80.9 kg  BMI:  Body mass index is 23.47 kg/(m^2).  Estimated Nutritional Needs:   Kcal:  1950-2350  Protein:  80-95 grams  Fluid:  Urine output + 1000cc  EDUCATION NEEDS:   No education needs identified at this  time  Satira Anis. Landan Fedie, MS, RD LDN After Hours/Weekend Pager [phone]

## 2015-05-09 NOTE — Progress Notes (Signed)
[facility] Progress Note  Assessment/Plan: 1. Hematemesis/ heme + stool - for EGD today; work up per primary/GI; Bili increasing 2. 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 3. Hypertension/volume/ascites - ordered 4 L removed on HD Tuesday and net UF only 1.1 L -He IS below EDW because he has lost wt. Plan HD  for further ultrafiltration to help diminish volume/ascites- will have much lower EDW, will do after converted to inpatient 4. Anemia - on max ESA- Aranesp 200 given 4/11; Give ferrlicit 0000000 x 4 5. Metabolic bone disease - Noncompliant with binders/sensipar; will  resume when off NPO status- hold on hectorol for now 6. Nutrition - albumin serially trending down in the outpt setting; needs high pro diet/adequate calories/unintentional weight loss; alb 2.6; NPO at present 7. Thrombocytopenia- dropping now 81 (last outpt plts were 96 in March prior to that were stable 120 - 160 range) 8. Medical and dialysis noncompliance - contributory to multiple issues 9. Ventral hernia - stable followed by Dr. Marlou Starks 10. NONADHERENCE  Myriam Jacobson, PA-C [place] [phone] 05/09/2015,8:44 AM  LOS: 1 day  I have seen and examined this patient and agree with the plan of care seen, eval, examined, counseled .  Alexander Alcock L 05/09/2015, 11:21 AM   Subjective:   A little hungry. No problems with HD yesterday  Objective Filed Vitals:   05/08/15 2235 05/09/15 0000 05/09/15 0030 05/09/15 0435  BP: 137/82   144/86  Pulse: 72   76  Temp: 97.7 F (36.5 C)   98.6 F (37 C)  TempSrc: Oral   Oral  Resp: 18 18 20 17   Height: 6' (1.829 m)     Weight: 78.5 kg (173 lb 1 oz)     SpO2: 98% 86% 94% 97%   Physical Exam General: chronically ill WM NAD in bed Heart: RRR Lungs: crackles at bases Abdomen:  distended + fluid wave, ventral hernia Extremities: tr LE edema, SCDs Dialysis Access: left AVF + bruit  Dialysis Orders: [facility] T[facility] 4 hr 350 800 EDW 82.5 2 K 2.25 Ca profile 2 left AVF no heparin - Aranesp 200 weekly last 4/4 - due today Recent labs: Hgb 10 4/4 trending up - had been in the 8s in Feb tsat 26% 3/21 - not treated- ferritin 780 1/24 iPTH 242 - Hectorol 1 on hold due to [place] correct Ca (supposed to be on renvela 3 ac and sensipar 60) P has been ^^^  Additional Objective Labs: Basic Metabolic Panel:  Recent Labs Lab 05/08/15 0035 05/09/15 0651  NA 139 141  K 5.0 4.7  CL 84* 94*  CO2 37* 31  GLUCOSE 90 82  BUN 56* 31*  CREATININE 16.55* 10.81*  CALCIUM 9.5 9.2   Liver Function Tests:  Recent Labs Lab 05/08/15 0035 05/09/15 0651  AST 20 18  ALT 9* 8*  ALKPHOS 84 79  BILITOT 1.7* 2.4*  PROT 6.7 6.4*  ALBUMIN 2.6* 2.3*    Recent Labs Lab 05/08/15 0035  LIPASE 34   CBC:  Recent Labs Lab 05/08/15 0035 05/09/15 0651  WBC 4.7 4.7  HGB 10.1* 9.6*  HCT 31.4* 30.4*  MCV 95.2 96.8  PLT 101* 81*    Studies/Results: X-ray Chest Pa And Lateral  05/08/2015  CLINICAL DATA:  60 year old male with vomiting today during dialysis. Hypoxia and cough. Initial encounter.  EXAM: CHEST  2 VIEW COMPARISON:  [facility] CT Abdomen and Pelvis 04/27/2015 Report of chest radiograph 02/07/2013 (no images available). FINDINGS: Low lung volumes with continued small to moderate right pleural effusion. No superimposed pneumothorax, pulmonary edema, or confluent pulmonary opacity. Stable cardiomegaly and mediastinal contours. Visualized tracheal air column is within normal limits. No acute osseous abnormality identified. Calcified aortic atherosclerosis. Gray hazy opacity throughout the abdomen compatible with the large volume ascites seen recently by CT. IMPRESSION: 1. Small to moderate right pleural effusion appears stable since 04/27/2015. 2. No new cardiopulmonary  abnormality. 3. Ascites. Electronically Signed   By: Genevie Ann M.D.   On: 05/08/2015 11:41   Medications: . sodium chloride 1,000 mL (05/08/15 2301)   . darbepoetin (ARANESP) injection - DIALYSIS  200 mcg Intravenous Q Tue-HD  . ferric gluconate (FERRLECIT/NULECIT) IV  125 mg Intravenous Q T,Th,Sa-HD  . hydrocortisone cream   Topical QID  . pantoprazole (PROTONIX) IV  40 mg Intravenous Q12H

## 2015-05-09 NOTE — Anesthesia Preprocedure Evaluation (Addendum)
Anesthesia Evaluation  Patient identified by MRN, date of birth, ID band Patient awake    Reviewed: Allergy & Precautions, NPO status , Patient's Chart, lab work & pertinent test results  Airway Mallampati: II  TM Distance: >3 FB Neck ROM: Full    Dental   Pulmonary shortness of breath and with exertion,    breath sounds clear to auscultation       Cardiovascular hypertension,  Rhythm:Regular Rate:Normal     Neuro/Psych negative neurological ROS     GI/Hepatic Neg liver ROS, PUD,   Endo/Other  negative endocrine ROS  Renal/GU ESRF and DialysisRenal disease     Musculoskeletal   Abdominal   Peds  Hematology  (+) anemia ,   Anesthesia Other Findings   Reproductive/Obstetrics                            Lab Results  Component Value Date   WBC 4.7 05/09/2015   HGB 9.6* 05/09/2015   HCT 30.4* 05/09/2015   MCV 96.8 05/09/2015   PLT 81* 05/09/2015   Lab Results  Component Value Date   CREATININE 10.81* 05/09/2015   BUN 31* 05/09/2015   NA 141 05/09/2015   K 4.7 05/09/2015   CL 94* 05/09/2015   CO2 31 05/09/2015    Anesthesia Physical Anesthesia Plan  ASA: III  Anesthesia Plan: MAC   Post-op Pain Management:    Induction: Intravenous  Airway Management Planned: Natural Airway  Additional Equipment:   Intra-op Plan:   Post-operative Plan:   Informed Consent: I have reviewed the patients History and Physical, chart, labs and discussed the procedure including the risks, benefits and alternatives for the proposed anesthesia with the patient or authorized representative who has indicated his/her understanding and acceptance.     Plan Discussed with: CRNA  Anesthesia Plan Comments:         Anesthesia Quick Evaluation

## 2015-05-09 NOTE — Anesthesia Procedure Notes (Signed)
Procedure Name: MAC Date/Time: 05/09/2015 3:00 PM Performed by: Eligha Bridegroom Pre-anesthesia Checklist: Patient identified, Emergency Drugs available, Suction available, Patient being monitored and Timeout performed Patient Re-evaluated:Patient Re-evaluated prior to inductionOxygen Delivery Method: Nasal cannula Preoxygenation: Pre-oxygenation with 100% oxygen Intubation Type: IV induction

## 2015-05-09 NOTE — Interval H&P Note (Signed)
History and Physical Interval Note:  05/09/2015 2:02 PM  Alexander Grant.  has presented today for surgery, with the diagnosis of Hematemesis/gastro-occult positive  The various methods of treatment have been discussed with the patient and family. After consideration of risks, benefits and other options for treatment, the patient has consented to  Procedure(s): ESOPHAGOGASTRODUODENOSCOPY (EGD) (N/A) as a surgical intervention .  The patient's history has been reviewed, patient examined, no change in status, stable for surgery.  I have reviewed the patient's chart and labs.  Questions were answered to the patient's satisfaction.     [name]

## 2015-05-09 NOTE — H&P (View-Only) (Signed)
Referring Provider: No ref. provider found Primary Care Physician:  Louis Meckel, MD Primary Gastroenterologist:  Dr. Olevia Perches  Reason for Consultation:  Heme positive stool  HPI: Alexander Grant. is a 60 y.o. 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  Patient was at dialysis this morning when he had an episode of nausea and vomiting around 0730. The emesis was red in color and the patient states that he had cranberry juice this AM so he thought it was from that, however, it was apparently heme positive so they suggested he come to the ED. He states he also had an episode of vomiting last evening without signs of blood in his vomit. He states he has been experiencing intermittent dull and sometimes sharp upper abdominal pains for the past 2 weeks and his appetite has been poor.  Also feels fatigued.  Has abdominal distention from ascites fluid as seen on recent CT scan, which is making him feel full quickly when eating.  He denies blood in his stool or dark tarry stools.  He only received roughly one hour of dialysis this morning.  Hgb is 10.1 grams currently, which is apparently his baseline.  Denies NSAID use.  Is not on an acid medication at home and denies any reflux type symptoms.  Colonoscopy 08/2011 by Dr. Hilarie Fredrickson with several polyps removed (tubular adenomas and eroded inflammatory polyps) but prep was poor and it was recommended that he have a repeat colonoscopy in 6 months from that time, which he did not have performed.  EGD 08/2011 with reflux esophagitis and Hpylori negative gastritis (reactive gastropathy and focal ulceration on pathology).  Just of note, he just had a CT scan of the abdomen ane pelvis without contrast on 04/27/2015, which showed the following:   "IMPRESSION: Large volume abdominopelvic  ascites.  Small fat/fluid containing midline supraumbilical ventral hernia. Small fat/fluid containing right inguinal hernia.  Small moderate right pleural effusion.  Layering gallbladder sludge, without associated inflammatory changes.  Additional ancillary findings as above."   Past Medical History  Diagnosis Date  . Fournier gangrene     with peri-rectal/perineal abscess drainage  . PONV (postoperative nausea and vomiting)   . Wegener's granulomatosis with renal involvement ([place])   . Anemia   . History of blood transfusion 08/2011  . Lower GI bleeding 10/01/2011  . Cocaine abuse 2013  . Tubular adenoma of colon   . Hyponatremia   . Acute renal failure ([place])   . Pulmonary infiltrate   . Reflux esophagitis   . Diverticulosis   . Right thyroid nodule   . Cardiomegaly   . Renal disorder     Dialysis  . Kidney stone   . Hypertension     Past Surgical History  Procedure Laterality Date  . Esophagogastroduodenoscopy  09/10/2011    Procedure: ESOPHAGOGASTRODUODENOSCOPY (EGD);  Surgeon: Jerene Bears, MD;  Location: [facility];  Service: Gastroenterology;  Laterality: N/A;  . Colonoscopy  09/11/2011    Procedure: COLONOSCOPY;  Surgeon: Jerene Bears, MD;  Location: [facility];  Service: Gastroenterology;  Laterality: N/A;  . Knee arthroscopy w/ medial collateral ligament (mcl) repair  ~ 2000    left  . Knee arthroscopy w/ acl reconstruction  2002  . Shoulder open rotator cuff repair   09/1999    right  . Incision and drainage perirectal abscess  05/2008    '  for Fournier's gangrene"  . Kidney stone surgery  1990  . Vasectomy    . Fracture surgery Right     wrist and great toe  . Av fistula placement Left 02/07/2013    Procedure: ARTERIOVENOUS (AV) FISTULA CREATION- LEFT BRACHIOCEPHALIC ;  Surgeon: Rosetta Posner, MD;  Location: [place];  Service: Vascular;  Laterality: Left;  . Insertion of dialysis catheter N/A 02/07/2013    Procedure: INSERTION OF DIALYSIS CATHETER;   Surgeon: Rosetta Posner, MD;  Location: [facility];  Service: Vascular;  Laterality: N/A;  . Anterior cruciate ligament repair    . Shoulder surgery Left     Prior to Admission medications   Medication Sig Start Date End Date Taking? Authorizing Provider  acetaminophen (TYLENOL) 500 MG tablet Take 1,000 mg by mouth every 6 (six) hours as needed for mild pain.   Yes Historical Provider, MD  cinacalcet (SENSIPAR) 30 MG tablet Take 60 mg by mouth every other day.    Yes Historical Provider, MD  RENAGEL 800 MG tablet Take 2,400 mg by mouth 3 (three) times daily. 03/12/15  Yes Historical Provider, MD    Current Facility-Administered Medications  Medication Dose Route Frequency Provider Last Rate Last Dose  . pantoprazole (PROTONIX) 80 mg in sodium chloride 0.9 % 100 mL IVPB  80 mg Intravenous Once Kalman Drape, PA 300 mL/hr at 05/08/15 1006 80 mg at 05/08/15 1006   Current Outpatient Prescriptions  Medication Sig Dispense Refill  . acetaminophen (TYLENOL) 500 MG tablet Take 1,000 mg by mouth every 6 (six) hours as needed for mild pain.    . cinacalcet (SENSIPAR) 30 MG tablet Take 60 mg by mouth every other day.     Marland Kitchen RENAGEL 800 MG tablet Take 2,400 mg by mouth 3 (three) times daily.  6    Allergies as of 05/08/2015 - Review Complete 05/08/2015  Allergen Reaction Noted  . Bee pollen Anaphylaxis, Shortness Of Breath, and Rash 10/01/2011  . Bee venom Anaphylaxis 08/01/2013  . Percocet [oxycodone-acetaminophen] Hives and Rash 09/01/2011  . Percocet [oxycodone-acetaminophen] Hives 08/01/2013    Family History  Problem Relation Age of Onset  . Stroke Mother   . Cancer Father     Social History   Social History  . Marital Status: Divorced    Spouse Name: N/A  . Number of Children: N/A  . Years of Education: N/A   Occupational History  . Not on file.   Social History Main Topics  . Smoking status: Never Smoker   . Smokeless tobacco: Not on file  . Alcohol Use: No     Comment:  10/01/2011 "last alcohol ~ 2012"  . Drug Use: No     Comment: as recent as 09/02/11 per toxicology screen at hospital  . Sexual Activity: Not Currently   Other Topics Concern  . Not on file   Social History Narrative   ** Merged History Encounter **        Review of Systems: Ten point ROS is O/W negative except as mentioned in HPI.  Physical Exam: Vital signs in last 24 hours: Temp:  [98.1 F (36.7 C)] 98.1 F (36.7 C) (04/11 0759) Pulse Rate:  [68-75] 68 (04/11 1000) Resp:  [17-22] 20 (04/11 1000) BP: (131-154)/(82-98) 151/84 mmHg (04/11 1000) SpO2:  [92 %-100 %] 100 % (04/11 1000)   General:  Alert, Well-developed, well-nourished, pleasant and cooperative in NAD Head:  Normocephalic and atraumatic. Eyes:  Sclera clear, no icterus.  Conjunctiva pink. Ears:  Normal auditory acuity. Mouth:  No deformity or lesions.   Lungs:  Clear throughout to auscultation.  No wheezes, crackles, or rhonchi.  Heart:  Regular rate and rhythm; no murmurs, clicks, rubs, or gallops. Abdomen:  Soft but distended with ascites fluid.  BS present.  Reducible ventral hernia noted.  Mild upper abdominal TTP. Rectal:  Deferred  Msk:  Symmetrical without gross deformities. Pulses:  Normal pulses noted. Extremities:  Without clubbing or edema. Neurologic:  Alert and oriented x 4;  grossly normal neurologically. Skin:  Intact without significant lesions or rashes. Psych:  Alert and cooperative. Normal mood and affect.  Lab Results:  Recent Labs  05/08/15 0035  WBC 4.7  HGB 10.1*  HCT 31.4*  PLT 101*   BMET  Recent Labs  05/08/15 0035  NA 139  K 5.0  CL 84*  CO2 37*  GLUCOSE 90  BUN 56*  CREATININE 16.55*  CALCIUM 9.5   LFT  Recent Labs  05/08/15 0035  PROT 6.7  ALBUMIN 2.6*  AST 20  ALT 9*  ALKPHOS 84  BILITOT 1.7*   IMPRESSION:  -60 year old male with 2 weeks of upper abdominal pain and 3 episodes of vomiting, one with questionable blood vs cranberry juice but was  gastro-occult positive.  Hgb stable at baseline.  EGD in 2013 showed reflux esophagitis; patient not on PPI at home and denies reflux symptoms.  ? If bleeding was from esophagitis vs MWT vs ulcer disease (does not use NSAID's and was Hpylori negative previously), etc. -ESRD on HD two days a week -Ventral hernia:  Reducible.  Seen by Dr. Marlou Starks as outpatient. -Ascites/abdominal distention  PLAN: -Will place on pantoprazole 40 mg IV BID for now. -EGD later today or tomorrow.  ZEHR, JESSICA D.  05/08/2015, 10:10 AM  Pager number [phone]     ATTENDING ADDENDUM: I have taken an interval history, reviewed the chart, and examined the patient. I agree with the Advanced Practitioner's note and impression.  Patient with ESRD on HD presenting with a few weeks of upper abdominal pain and a few episodes of vomiting - with ? Small volume hematemesis today. If he had bleeding from this it would appear to be minimal. Hgb at baseline. Will treat with PPI and offered the patient an upper endoscopy. I discussed the risks / benefits of endoscopy and sedation with him, and he wished to proceed. Scheduled for tomorrow, please call with changes in his status overnight, he's currently stable.  Of note, large volume ascites noted on last CT. He has some mild thrombocytopenia but liver does not appear cirrhosis. Unclear if this is from nephrotic syndrome? Would recommend diagnostic paracentesis at some point to further evaluate this issue.   [place] Cellar, MD [facility] Pager [phone]

## 2015-05-10 ENCOUNTER — Encounter (HOSPITAL_COMMUNITY)
Admission: EM | Disposition: A | Discharge status: Home / Self Care | Payer: Self-pay | Source: Home / Self Care | Attending: Oncology

## 2015-05-10 ENCOUNTER — Encounter (HOSPITAL_COMMUNITY): Payer: Self-pay | Admitting: Gastroenterology

## 2015-05-10 DIAGNOSIS — K209 Esophagitis, unspecified without bleeding: Secondary | ICD-10-CM | POA: Insufficient documentation

## 2015-05-10 DIAGNOSIS — K2970 Gastritis, unspecified, without bleeding: Secondary | ICD-10-CM

## 2015-05-10 LAB — RENAL FUNCTION PANEL
Albumin: 2.4 g/dL — ABNORMAL LOW (ref 3.5–5.0)
Albumin: 2.4 g/dL — ABNORMAL LOW (ref 3.5–5.0)
Anion gap: 14 (ref 5–15)
Anion gap: 15 (ref 5–15)
BUN: 18 mg/dL (ref 6–20)
BUN: 18 mg/dL (ref 6–20)
CALCIUM: 9.3 mg/dL (ref 8.9–10.3)
CHLORIDE: 93 mmol/L — AB (ref 101–111)
CO2: 33 mmol/L — AB (ref 22–32)
CO2: 30 mmol/L (ref 22–32)
CREATININE: 7.33 mg/dL — AB (ref 0.61–1.24)
Calcium: 9.2 mg/dL (ref 8.9–10.3)
Chloride: 93 mmol/L — ABNORMAL LOW (ref 101–111)
Creatinine, Ser: 7.91 mg/dL — ABNORMAL HIGH (ref 0.61–1.24)
GFR calc Af Amer: 8 mL/min — ABNORMAL LOW (ref >60)
GFR calc non Af Amer: 7 mL/min — ABNORMAL LOW (ref >60)
GFR calc non Af Amer: 7 mL/min — ABNORMAL LOW (ref >60)
GFR, EST AFRICAN AMERICAN: 8 mL/min — AB (ref >60)
GLUCOSE: 102 mg/dL — AB (ref 65–99)
Glucose, Bld: 124 mg/dL — ABNORMAL HIGH (ref 65–99)
Phosphorus: 5.6 mg/dL — ABNORMAL HIGH (ref 2.5–4.6)
Phosphorus: 5.4 mg/dL — ABNORMAL HIGH (ref 2.5–4.6)
Potassium: 3.9 mmol/L (ref 3.5–5.1)
Potassium: 3.7 mmol/L (ref 3.5–5.1)
SODIUM: 140 mmol/L (ref 135–145)
Sodium: 138 mmol/L (ref 135–145)

## 2015-05-10 LAB — CBC
HEMATOCRIT: 32.7 % — AB (ref 39.0–52.0)
HEMOGLOBIN: 10.3 g/dL — AB (ref 13.0–17.0)
MCH: 30 pg (ref 26.0–34.0)
MCHC: 31.5 g/dL (ref 30.0–36.0)
MCV: 95.3 fL (ref 78.0–100.0)
PLATELETS: 118 10*3/uL — AB (ref 150–400)
RBC: 3.43 MIL/uL — AB (ref 4.22–5.81)
RDW: 16.4 % — ABNORMAL HIGH (ref 11.5–15.5)
WBC: 6.4 10*3/uL (ref 4.0–10.5)

## 2015-05-10 LAB — HIV ANTIBODY (ROUTINE TESTING W REFLEX): HIV SCREEN 4TH GENERATION: NONREACTIVE

## 2015-05-10 SURGERY — EGD (ESOPHAGOGASTRODUODENOSCOPY)
Anesthesia: Moderate Sedation

## 2015-05-10 MED ORDER — LIDOCAINE HCL (PF) 1 % IJ SOLN: 5.0000 mL | INTRAMUSCULAR | Status: DC | PRN

## 2015-05-10 MED ORDER — ONDANSETRON HCL 4 MG/2ML IJ SOLN
INTRAMUSCULAR | Status: AC
  Filled 2015-05-10: qty 2

## 2015-05-10 MED ORDER — SODIUM CHLORIDE 0.9 % IV SOLN: 100.0000 mL | INTRAVENOUS | Status: DC | PRN

## 2015-05-10 MED ORDER — FENTANYL CITRATE (PF) 100 MCG/2ML IJ SOLN
12.5000 ug | Freq: Once | INTRAMUSCULAR | Status: AC
  Administered 2015-05-10: 12.5 ug via INTRAVENOUS

## 2015-05-10 MED ORDER — ALTEPLASE 2 MG IJ SOLR: 2.0000 mg | Freq: Once | INTRAMUSCULAR | Status: DC | PRN

## 2015-05-10 MED ORDER — PENTAFLUOROPROP-TETRAFLUOROETH EX AERO: 1.0000 "application " | INHALATION_SPRAY | CUTANEOUS | Status: DC | PRN

## 2015-05-10 MED ORDER — LIDOCAINE-PRILOCAINE 2.5-2.5 % EX CREA
1.0000 | TOPICAL_CREAM | CUTANEOUS | Status: DC | PRN
Start: 2015-05-10 — End: 2015-05-10

## 2015-05-10 MED ORDER — LIDOCAINE-PRILOCAINE 2.5-2.5 % EX CREA: 1.0000 "application " | TOPICAL_CREAM | CUTANEOUS | Status: DC | PRN

## 2015-05-10 MED ORDER — FENTANYL CITRATE (PF) 100 MCG/2ML IJ SOLN
INTRAMUSCULAR | Status: AC
  Filled 2015-05-10: qty 2

## 2015-05-10 MED ORDER — HEPARIN SODIUM (PORCINE) 1000 UNIT/ML DIALYSIS
1000.0000 [IU] | INTRAMUSCULAR | Status: DC | PRN
Start: 2015-05-10 — End: 2015-05-10

## 2015-05-10 MED ORDER — HEPARIN SODIUM (PORCINE) 1000 UNIT/ML DIALYSIS: 1000.0000 [IU] | INTRAMUSCULAR | Status: DC | PRN

## 2015-05-10 NOTE — Progress Notes (Signed)
Subjective: Interval History: has complaints miserable.  Objective: Vital signs in last 24 hours: Temp:  [96.5 F (35.8 C)-98 F (36.7 C)] 97.8 F (36.6 C) (04/13 0550) Pulse Rate:  [65-82] 82 (04/13 0550) Resp:  [15-65] 18 (04/13 0550) BP: (84-153)/(58-97) 138/88 mmHg (04/13 0550) SpO2:  [85 %-98 %] 97 % (04/13 0550) Weight:  [77.8 kg (171 lb 8.3 oz)-79.4 kg (175 lb 0.7 oz)] 79.3 kg (174 lb 13.2 oz) (04/12 2136) Weight change: -0.07 kg (-2.5 oz)  Intake/Output from previous day: 04/12 0701 - 04/13 0700 In: 80 [I.V.:80] Out: 1519  Intake/Output this shift:    General appearance: cooperative and pale Resp: diminished breath sounds bibasilar and rales bibasilar Cardio: S1, S2 normal and systolic murmur: holosystolic 2/6, blowing at apex GI: pos bs,pos FW Extremities: edema 2+  Lab Results:  Recent Labs  05/09/15 0651 05/10/15 0912  WBC 4.7 6.4  HGB 9.6* 10.3*  HCT 30.4* 32.7*  PLT 81* 118*   BMET:  Recent Labs  05/10/15 0651 05/10/15 0913  NA 140 138  K 3.9 3.7  CL 93* 93*  CO2 33* 30  GLUCOSE 102* 124*  BUN 18 18  CREATININE 7.33* 7.91*  CALCIUM 9.3 9.2   No results for input(s): PTH in the last 72 hours. Iron Studies: No results for input(s): IRON, TIBC, TRANSFERRIN, FERRITIN in the last 72 hours.  Studies/Results: X-ray Chest Pa And Lateral  05/08/2015  CLINICAL DATA:  60 year old male with vomiting today during dialysis. Hypoxia and cough. Initial encounter. EXAM: CHEST  2 VIEW COMPARISON:  [facility] CT Abdomen and Pelvis 04/27/2015 Report of chest radiograph 02/07/2013 (no images available). FINDINGS: Low lung volumes with continued small to moderate right pleural effusion. No superimposed pneumothorax, pulmonary edema, or confluent pulmonary opacity. Stable cardiomegaly and mediastinal contours. Visualized tracheal air column is within normal limits. No acute osseous abnormality identified. Calcified aortic atherosclerosis. Gray hazy opacity  throughout the abdomen compatible with the large volume ascites seen recently by CT. IMPRESSION: 1. Small to moderate right pleural effusion appears stable since 04/27/2015. 2. No new cardiopulmonary abnormality. 3. Ascites. Electronically Signed   By: Genevie Ann M.D.   On: 05/08/2015 11:41    I have reviewed the patient's current medications.  Assessment/Plan: 1 ESRD needs reg HD and vol /solute off 2 Anemai stable 3 GIB esophagits plan per GI 4 HPTH meds 5 Ascites suspect Uremic, but r/o other causes 6 substance abuse P HD, lower solute/vol, paracentesis    LOS: 2 days   Reece Fehnel L 05/10/2015,10:25 AM

## 2015-05-10 NOTE — Progress Notes (Signed)
Subjective:  Patient had an episode of severe abdominal discomfort that had resolved by this morning. He received IV fentanyl overnight, which was helpful. He  reports that his nausea has improved significantly, no episodes of vomiting. He endorses some persistent subjective dyspnea.   Objective: Vital signs in last 24 hours: Filed Vitals:   05/10/15 0930 05/10/15 1000 05/10/15 1030 05/10/15 1059  BP: 94/68 87/51 91/52  74/41  Pulse: 87 83 98 88  Temp:      TempSrc:      Resp:      Height:      Weight:      SpO2:       Weight change: -2.5 oz (-0.07 kg)  Intake/Output Summary (Last 24 hours) at 05/10/15 1155 Last data filed at 05/09/15 2100  Gross per 24 hour  Intake      0 ml  Output   1519 ml  Net  -1519 ml   Physical Exam General: Lying in bed, NAD. Cachectic appearing. HEENT: MMM, no scleral icterus Cardiovascular: RRR, no m/r/g Pulmonary: Fine crackles at the bases. Unlabored breathing Abdominal: +fluid wave, abdomen more distended than yesterday Extremities: No lower extremity edema. No clubbing Neurological: Alert. Speech normal Psychiatric: Normal behavior and affect.   Lab Results: Basic Metabolic Panel:  Recent Labs Lab 05/10/15 0651 05/10/15 0913  NA 140 138  K 3.9 3.7  CL 93* 93*  CO2 33* 30  GLUCOSE 102* 124*  BUN 18 18  CREATININE 7.33* 7.91*  CALCIUM 9.3 9.2  PHOS 5.6* 5.4*   Liver Function Tests:  Recent Labs Lab 05/08/15 0035 05/09/15 0651 05/10/15 0651 05/10/15 0913  AST 20 18  --   --   ALT 9* 8*  --   --   ALKPHOS 84 79  --   --   BILITOT 1.7* 2.4*  --   --   PROT 6.7 6.4*  --   --   ALBUMIN 2.6* 2.3* 2.4* 2.4*    CBC:  Recent Labs Lab 05/09/15 0651 05/10/15 0912  WBC 4.7 6.4  HGB 9.6* 10.3*  HCT 30.4* 32.7*  MCV 96.8 95.3  PLT 81* 118*   Coagulation:  Recent Labs Lab 05/08/15 1500  LABPROT 15.9*  INR 1.25   Studies/Results: No results found. Medications: I have reviewed the patient's current  medications. Scheduled Meds: . darbepoetin (ARANESP) injection - DIALYSIS  200 mcg Intravenous Q Tue-HD  . feeding supplement (NEPRO CARB STEADY)  237 mL Oral TID BM  . fentaNYL      . ferric gluconate (FERRLECIT/NULECIT) IV  125 mg Intravenous Q T,Th,Sa-HD  . hydrocortisone cream   Topical QID  . ondansetron      . pantoprazole (PROTONIX) IV  40 mg Intravenous Q12H   Continuous Infusions:   PRN Meds:.sodium chloride, sodium chloride, alteplase, heparin, lidocaine (PF), lidocaine-prilocaine, ondansetron **OR** ondansetron (ZOFRAN) IV, pentafluoroprop-tetrafluoroeth Assessment/Plan:  Ascites: Significant ascites on exam and imaging. Appears more distended since admission. No evidence of cirrhosis on imaging- normal INR and LFTs. PT time is slightly elevated. Low albumin, elevated protein/creatine ratio invokes possibility of nephrotic syndrome. This may be leading a hypercoagulable state. 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 This ascites is likely leading to his subjective dyspnea, with low lung volumes on CXR.  - 24 hour urine protein pending - [place] to query Budd-Chiari - Therapeutic/Dx paracentesis with peritoneal albumin and culture, gram stain - Consider therapeutic paracentesis of dyspnea not improved with HD  Esophagitis/Gastritis : Confirmed  in EGD. Gastric polyp versus enlarged fold identified.  Patient has known history of gastritis and esophagitis in 2013.   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 Received HD on day of admission - Possibly HD this afternoon after EGD  DVT/PE PPx: SCD's  Dispo: Disposition is deferred at this time, awaiting improvement of current medical problems.  Anticipated discharge in approximately 2-3 day(s).   The patient does have a current PCP Corliss Parish, MD) and does not need  an [facility] hospital follow-up appointment after discharge.  The patient does have transportation limitations that hinder transportation to clinic appointments.  .Services Needed at time of discharge: Y = Yes, Blank = No PT:   OT:   RN:   Equipment:   Other:     LOS: 2 days   Liberty Handy, MD 05/10/2015, 11:55 AM

## 2015-05-10 NOTE — Procedures (Signed)
I was present at this session.  I have reviewed the session itself and made appropriate changes.  Hd vial LUA AVF, bp low post fentanyl  Garland Hincapie L 4/13/201710:29 AM

## 2015-05-10 NOTE — Progress Notes (Signed)
     [facility] Progress Note  Subjective:  Seen in HD.  Very tired.  For paracentesis today, which he is anticipating to hopefully help him feel better once fluid is removed.  EGD yesterday, 4/12 showed the following:  - Esophagogastric landmarks identified. - 6 cm hiatal hernia. - Severe reflux esophagitis vs. candida esophagitis, which I suspect is the likely cause of the patient's symptoms. Brushings obtained to rule out Candidiasis. - Suspected enlarged gastric fold vs. gastric polyp. Biopsied. 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 - Normal duodenal bulb. - A single duodenal polyp. Resected and retrieved.   Objective:  Vital signs in last 24 hours: Temp:  [96.5 F (35.8 C)-98 F (36.7 C)] 97.1 F (36.2 C) (04/13 0845) Pulse Rate:  [65-98] 88 (04/13 1059) Resp:  [15-65] 25 (04/13 0845) BP: (74-153)/(41-97) 74/41 mmHg (04/13 1059) SpO2:  [85 %-98 %] 97 % (04/13 0845) Weight:  [171 lb 8.3 oz (77.8 kg)-175 lb 0.7 oz (79.4 kg)] 173 lb 15.1 oz (78.9 kg) (04/13 0845) Last BM Date: 05/07/15 General:  Alert, Well-developed, in NAD; appears fatigued Heart:  Regular rate and rhythm; no murmurs Pulm:  CTAB.  No W/R/R. Abdomen:  Softly distended with ascites fluid.  BS present.  Non-tender. Extremities:  Without edema. Neurologic:  Alert and oriented x 4;  grossly normal neurologically. Psych:  Alert and cooperative. Normal mood and affect.  Intake/Output from previous day: 04/12 0701 - 04/13 0700 In: 80 [I.V.:80] Out: 1519    Lab Results:  Recent Labs  05/08/15 0035 05/09/15 0651 05/10/15 0912  WBC 4.7 4.7 6.4  HGB 10.1* 9.6* 10.3*  HCT 31.4* 30.4* 32.7*  PLT 101* 81* 118*   BMET  Recent Labs  05/09/15 0651 05/10/15 0651 05/10/15 0913  NA 141 140 138  K 4.7 3.9 3.7  CL 94* 93* 93*  CO2 31 33* 30  GLUCOSE 82 102* 124*  BUN 31* 18 18  CREATININE 10.81* 7.33* 7.91*  CALCIUM 9.2 9.3 9.2   LFT  Recent Labs  05/09/15 0651  05/10/15 0913  PROT 6.4*  --   --   ALBUMIN 2.3*  < > 2.4*  AST 18  --   --   ALT 8*  --   --   ALKPHOS 79  --   --   BILITOT 2.4*  --   --   < > = values in this interval not displayed. PT/INR  Recent Labs  05/08/15 1500  LABPROT 15.9*  INR 1.25   Assessment / Plan: -Severe esophagitis seen on EGD 4/12 from reflux vs candida, biopsies pending.  Likely the source of his previous hematemesis.  Continue BID PPI for now and if positive for Candida then will need treated.  Hgb is stable. -Ascites:  For paracentesis today.  Fluid studies to be sent for cell count, etc, including albumin to help determine cause, ie liver vs other. -ESRD on HD two days per week   LOS: 2 days   Farris Geiman D.  05/10/2015, 11:54 AM  Pager number [phone]

## 2015-05-11 ENCOUNTER — Inpatient Hospital Stay (HOSPITAL_COMMUNITY): Payer: Medicaid Other

## 2015-05-11 DIAGNOSIS — N186 End stage renal disease: Secondary | ICD-10-CM

## 2015-05-11 DIAGNOSIS — R188 Other ascites: Secondary | ICD-10-CM

## 2015-05-11 DIAGNOSIS — F1410 Cocaine abuse, uncomplicated: Secondary | ICD-10-CM

## 2015-05-11 DIAGNOSIS — K209 Esophagitis, unspecified: Secondary | ICD-10-CM

## 2015-05-11 LAB — BODY FLUID CELL COUNT WITH DIFFERENTIAL
EOS FL: 0 %
LYMPHS FL: 18 %
MONOCYTE-MACROPHAGE-SEROUS FLUID: 78 % (ref 50–90)
NEUTROPHIL FLUID: 4 % (ref 0–25)
WBC FLUID: 96 uL (ref 0–1000)

## 2015-05-11 LAB — CBC
HCT: 32 % — ABNORMAL LOW (ref 39.0–52.0)
HEMOGLOBIN: 10 g/dL — AB (ref 13.0–17.0)
MCH: 30.1 pg (ref 26.0–34.0)
MCHC: 31.3 g/dL (ref 30.0–36.0)
MCV: 96.4 fL (ref 78.0–100.0)
PLATELETS: 108 10*3/uL — AB (ref 150–400)
RBC: 3.32 MIL/uL — AB (ref 4.22–5.81)
RDW: 16.3 % — ABNORMAL HIGH (ref 11.5–15.5)
WBC: 6.6 10*3/uL (ref 4.0–10.5)

## 2015-05-11 LAB — LACTATE DEHYDROGENASE, PLEURAL OR PERITONEAL FLUID: LD, Fluid: 72 U/L — ABNORMAL HIGH (ref 3–23)

## 2015-05-11 LAB — BASIC METABOLIC PANEL
Anion gap: 12 (ref 5–15)
BUN: 16 mg/dL (ref 6–20)
CHLORIDE: 94 mmol/L — AB (ref 101–111)
CO2: 32 mmol/L (ref 22–32)
CREATININE: 6.79 mg/dL — AB (ref 0.61–1.24)
Calcium: 9.4 mg/dL (ref 8.9–10.3)
GFR, EST AFRICAN AMERICAN: 9 mL/min — AB (ref >60)
GFR, EST NON AFRICAN AMERICAN: 8 mL/min — AB (ref >60)
Glucose, Bld: 92 mg/dL (ref 65–99)
POTASSIUM: 3.9 mmol/L (ref 3.5–5.1)
SODIUM: 138 mmol/L (ref 135–145)

## 2015-05-11 LAB — PROTEIN, BODY FLUID: TOTAL PROTEIN, FLUID: 3.3 g/dL

## 2015-05-11 LAB — ALBUMIN, FLUID (OTHER): Albumin, Fluid: 1.4 g/dL

## 2015-05-11 LAB — GRAM STAIN

## 2015-05-11 MED ORDER — SUCRALFATE 1 GM/10ML PO SUSP
1.0000 g | Freq: Three times a day (TID) | ORAL | Status: DC
  Administered 2015-05-11: 1 g via ORAL
  Filled 2015-05-11: qty 10

## 2015-05-11 MED ORDER — PANTOPRAZOLE SODIUM 40 MG PO TBEC: 40.0000 mg | DELAYED_RELEASE_TABLET | Freq: Every day | ORAL | Status: DC

## 2015-05-11 MED ORDER — PANTOPRAZOLE SODIUM 40 MG PO TBEC
40.0000 mg | DELAYED_RELEASE_TABLET | Freq: Two times a day (BID) | ORAL | Status: DC
  Administered 2015-05-11 – 2015-05-12 (×3): 40 mg via ORAL
  Filled 2015-05-11 (×3): qty 1

## 2015-05-11 MED ORDER — LIDOCAINE HCL (PF) 1 % IJ SOLN
INTRAMUSCULAR | Status: AC
  Filled 2015-05-11: qty 10

## 2015-05-11 MED ORDER — CINACALCET HCL 30 MG PO TABS
60.0000 mg | ORAL_TABLET | Freq: Every day | ORAL | Status: DC
  Administered 2015-05-11: 60 mg via ORAL
  Filled 2015-05-11: qty 2

## 2015-05-11 MED ORDER — SEVELAMER CARBONATE 800 MG PO TABS
2400.0000 mg | ORAL_TABLET | Freq: Three times a day (TID) | ORAL | Status: DC
  Administered 2015-05-11 – 2015-05-12 (×2): 2400 mg via ORAL
  Filled 2015-05-11 (×2): qty 3

## 2015-05-11 NOTE — Progress Notes (Signed)
Subjective:  Patient denies any nausea or vomiting this morning. However, he says that getting the fluid off of him today would be "lovely." The ascitic fluid continues cause him significant discomfort.    Objective: Vital signs in last 24 hours: Filed Vitals:   05/10/15 2204 05/11/15 0428 05/11/15 0453 05/11/15 0847  BP: 119/82 115/80  142/88  Pulse: 86 87  85  Temp: 99 F (37.2 C) 99.1 F (37.3 C)  97.7 F (36.5 C)  TempSrc:    Oral  Resp: 19 20  18   Height:      Weight: 165 lb 5.5 oz (75 kg)     SpO2: 88% 85% 99% 92%   Weight change: -1 lb 1.6 oz (-0.5 kg)  Intake/Output Summary (Last 24 hours) at 05/11/15 0927 Last data filed at 05/10/15 1800  Gross per 24 hour  Intake    220 ml  Output   2500 ml  Net  -2280 ml   Physical Exam General: Lying in bed, NAD. Cachectic appearing. HEENT: MMM, no scleral icterus Cardiovascular: RRR, no m/r/g Pulmonary: Fine crackles at the bases. Unlabored breathing Abdominal: +fluid wave, abdomen more distended than yesterday. Ventral hernia mildly TTP. Extremities: No lower extremity edema. No clubbing Neurological: Alert. Speech normal Psychiatric: Normal behavior and affect.   Lab Results: Basic Metabolic Panel:  Recent Labs Lab 05/10/15 0651 05/10/15 0913 05/11/15 0555  NA 140 138 138  K 3.9 3.7 3.9  CL 93* 93* 94*  CO2 33* 30 32  GLUCOSE 102* 124* 92  BUN 18 18 16   CREATININE 7.33* 7.91* 6.79*  CALCIUM 9.3 9.2 9.4  PHOS 5.6* 5.4*  --    Liver Function Tests:  Recent Labs Lab 05/08/15 0035 05/09/15 0651 05/10/15 0651 05/10/15 0913  AST 20 18  --   --   ALT 9* 8*  --   --   ALKPHOS 84 79  --   --   BILITOT 1.7* 2.4*  --   --   PROT 6.7 6.4*  --   --   ALBUMIN 2.6* 2.3* 2.4* 2.4*    CBC:  Recent Labs Lab 05/10/15 0912 05/11/15 0555  WBC 6.4 6.6  HGB 10.3* 10.0*  HCT 32.7* 32.0*  MCV 95.3 96.4  PLT 118* 108*   Coagulation:  Recent Labs Lab 05/08/15 1500  LABPROT 15.9*  INR 1.25    Studies/Results: No results found. Medications: I have reviewed the patient's current medications. Scheduled Meds: . cinacalcet  60 mg Oral Q supper  . darbepoetin (ARANESP) injection - DIALYSIS  200 mcg Intravenous Q Tue-HD  . feeding supplement (NEPRO CARB STEADY)  237 mL Oral TID BM  . ferric gluconate (FERRLECIT/NULECIT) IV  125 mg Intravenous Q T,Th,Sa-HD  . hydrocortisone cream   Topical QID  . pantoprazole (PROTONIX) IV  40 mg Intravenous Q12H  . sevelamer carbonate  2,400 mg Oral TID WC   Continuous Infusions:   PRN Meds:.ondansetron **OR** ondansetron (ZOFRAN) IV Assessment/Plan:  Ascites: Significant ascites on exam and imaging. Appears more distended since admission. No evidence of cirrhosis on imaging- normal INR and LFTs. PT time is slightly elevated. Low albumin, elevated protein/creatine ratio invokes possibility of nephrotic syndrome. This may be leading a hypercoagulable state. This could be leading to portal hypertension secondary to hepatic or portal vein thrombosis. This ascites is likely leading to his subjective dyspnea, with low lung volumes on CXR.  - 24 hour urine protein pending - [place] to query hepatic or portal vein thrombosis -  Therapeutic/Dx paracentesis with peritoneal albumin and culture, gram stain. We appreciate Interventional Radiology's assistance with this patient.  Esophagitis/Gastritis : Confirmed in EGD. Gastric polyp versus enlarged fold identified.  Patient has known history of gastritis and esophagitis in 2013.   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  - Next session would be tomorrow if he stays overnight.  DVT/PE PPx: SCD's  Dispo: Anticipated discharge today or tomorrow depending on his symptomatic improvement after paracentesis.  The patient does have a current PCP Corliss Parish, MD) and does not need an  [facility] hospital follow-up appointment after discharge.  The patient does have transportation limitations that hinder transportation to clinic appointments.  .Services Needed at time of discharge: Y = Yes, Blank = No PT:   OT:   RN:   Equipment:   Other:     LOS: 3 days   Liberty Handy, MD 05/11/2015, 9:27 AM

## 2015-05-11 NOTE — Progress Notes (Signed)
Patient has had no urine output since 24 hour urine collection order has been placed. Order places 4/13 at [phone]

## 2015-05-11 NOTE — Progress Notes (Signed)
[facility] Progress Note  Assessment/Plan: 1. Hematemesis/ heme + stool - severe reflux vs candida esophagitis; polyp neg malignancy IV PPI 2. Ascites- for para and [place] today to try to evaluate an etiology other than dialysis induced due to noncompliance with outpt HD tmts; have not been very successful getting volume down via HD UF 4/11 1.1 4/12 1.5 and 2.5 4/13 - had vomiting on HD yesterday with BP dropsGiven fentanyl causing  Low bps 3. 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 Next HD Saturday Needs to dialyze reg and stay on 4. Hypertension/volume/ascites - improved with volume reduction. For paracentesis today ; will have a MUCH lower EDW at discharge.  Post HD standing weight Saturday should be fairly accurate. 5. Anemia - on max ESA- Aranesp 200 given 4/11; Give ferrlicit 0000000 x 4 6. Metabolic bone disease - Noncompliant with binders/sensipar; will resume as he will be on pos once testing is done;  hold on hectorol for now due to ^^ corr Ca- needs 2 Ca bath at d/c 7. Nutrition - albumin serially trending down in the outpt setting; needs high pro diet/adequate calories/unintentional weight loss; alb 2.4; NPO for US/para today 8. Thrombocytopenia- improving up to 108  (last outpt plts were 96 in March prior to that were stable 120 - 160 range) 9. Medical and dialysis noncompliance - contributory to multiple issues 10. Ventral hernia - stable followed by Dr. Marlou Starks 11. Cocaine abuse + 4/12- he has had + drug screens at outpt HD unit in the past  Myriam Jacobson, PA-C [place] [phone] 05/11/2015,8:55 AM  LOS: 3 days  I have seen and examined this patient and agree with the plan of care  Seen , eval, examined, counseled .  Mckenzie Bove L 05/11/2015, 10:03 AM   Subjective:   Last time he used cocaine was 3 weeks ago and only once; had    Objective Filed Vitals:   05/10/15 2204 05/11/15 0428 05/11/15 0453 05/11/15 0847  BP: 119/82 115/80  142/88  Pulse: 86 87  85  Temp: 99 F (37.2 C) 99.1 F (37.3 C)  97.7 F (36.5 C)  TempSrc:    Oral  Resp: 19 20  18   Height:      Weight: 75 kg (165 lb 5.5 oz)     SpO2: 88% 85% 99% 92%   Physical Exam General: emaciated WM, NAD Heart: RRR Gr 2/6 M Lungs: crackles at bases Abdomen: +++ ascites ventral hernia Extremities: no edema, muscle wasting Dialysis Access: left AVF  Dialysis Orders: [facility] TTS 4 hr 350 800 EDW 82.5 2 K 2.25 Ca profile 2 left AVF no heparin - Aranesp 200 weekly last 4/4 - due today Recent labs: Hgb 10 4/4 trending up - had been in the 8s in Feb tsat 26% 3/21 - not treated- ferritin 780 1/24 iPTH 242 - Hectorol 1 on hold due to [place] correct Ca (supposed to be on renvela 3 ac and sensipar 60) P has been ^^^  Additional Objective Labs: Basic Metabolic Panel:  Recent Labs Lab 05/10/15 0651 05/10/15 0913 05/11/15 0555  NA 140 138 138  K 3.9 3.7 3.9  CL 93* 93* 94*  CO2 33* 30 32  GLUCOSE 102* 124* 92  BUN 18 18 16   CREATININE 7.33* 7.91* 6.79*  CALCIUM 9.3 9.2 9.4  PHOS 5.6* 5.4*  --    Liver Function Tests:  Recent Labs Lab 05/08/15  [id number] 05/09/15 0651 05/10/15 0651 05/10/15 0913  AST 20 18  --   --   ALT 9* 8*  --   --   ALKPHOS 84 79  --   --   BILITOT 1.7* 2.4*  --   --   PROT 6.7 6.4*  --   --   ALBUMIN 2.6* 2.3* 2.4* 2.4*    Recent Labs Lab 05/08/15 0035  LIPASE 34   CBC:  Recent Labs Lab 05/08/15 0035 05/09/15 0651 05/10/15 0912 05/11/15 0555  WBC 4.7 4.7 6.4 6.6  HGB 10.1* 9.6* 10.3* 10.0*  HCT 31.4* 30.4* 32.7* 32.0*  MCV 95.2 96.8 95.3 96.4  PLT 101* 81* 118* 108*  CBG:  Recent Labs Lab 05/09/15 1547 05/09/15 1611  GLUCAP 60* 79   Medications:   . darbepoetin (ARANESP) injection - DIALYSIS  200 mcg Intravenous Q Tue-HD  . feeding supplement (NEPRO CARB STEADY)  237 mL Oral TID BM  . ferric  gluconate (FERRLECIT/NULECIT) IV  125 mg Intravenous Q T,Th,Sa-HD  . hydrocortisone cream   Topical QID  . pantoprazole (PROTONIX) IV  40 mg Intravenous Q12H

## 2015-05-11 NOTE — Progress Notes (Signed)
Daily Rounding Note  05/11/2015, 1:56 PM  LOS: 3 days   SUBJECTIVE:       More comfortable after paracentesis.  Hungry.    OBJECTIVE:         Vital signs in last 24 hours:    Temp:  [97.5 F (36.4 C)-99.1 F (37.3 C)] 97.7 F (36.5 C) (04/14 0847) Pulse Rate:  [72-87] 85 (04/14 0847) Resp:  [18-20] 18 (04/14 0847) BP: (93-142)/(59-88) 119/79 mmHg (04/14 1300) SpO2:  [85 %-99 %] 92 % (04/14 0847) Weight:  [75 kg (165 lb 5.5 oz)] 75 kg (165 lb 5.5 oz) (04/13 2204) Last BM Date: 05/10/15 Filed Weights   05/10/15 0845 05/10/15 1142 05/10/15 2204  Weight: 78.9 kg (173 lb 15.1 oz) 76.5 kg (168 lb 10.4 oz) 75 kg (165 lb 5.5 oz)   General: pleasant, comfortable.  Looks chronically ill   Heart: RRR Chest: clear bil.  No dyspnea or cough.  Abdomen: soft, NT, ND.  No mass or HSM  Extremities: no CCE.  Prominent, serpiginous AV fistula in left arm Neuro/Psych:  Oriented x 3.  Animated.    Intake/Output from previous day: 04/13 0701 - 04/14 0700 In: 220 [IV Piggyback:220] Out: 2500   Intake/Output this shift:    Lab Results:  Recent Labs  05/09/15 0651 05/10/15 0912 05/11/15 0555  WBC 4.7 6.4 6.6  HGB 9.6* 10.3* 10.0*  HCT 30.4* 32.7* 32.0*  PLT 81* 118* 108*   BMET  Recent Labs  05/10/15 0651 05/10/15 0913 05/11/15 0555  NA 140 138 138  K 3.9 3.7 3.9  CL 93* 93* 94*  CO2 33* 30 32  GLUCOSE 102* 124* 92  BUN 18 18 16   CREATININE 7.33* 7.91* 6.79*  CALCIUM 9.3 9.2 9.4   LFT  Recent Labs  05/09/15 0651 05/10/15 0651 05/10/15 0913  PROT 6.4*  --   --   ALBUMIN 2.3* 2.4* 2.4*  AST 18  --   --   ALT 8*  --   --   ALKPHOS 79  --   --   BILITOT 2.4*  --   --    PT/INR  Recent Labs  05/08/15 1500  LABPROT 15.9*  INR 1.25   Hepatitis Panel No results for input(s): HEPBSAG, HCVAB, HEPAIGM, HEPBIGM in the last 72 hours.  Studies/Results: No results found.   Scheduled Meds: .  cinacalcet  60 mg Oral Q supper  . darbepoetin (ARANESP) injection - DIALYSIS  200 mcg Intravenous Q Tue-HD  . feeding supplement (NEPRO CARB STEADY)  237 mL Oral TID BM  . ferric gluconate (FERRLECIT/NULECIT) IV  125 mg Intravenous Q T,Th,Sa-HD  . hydrocortisone cream   Topical QID  . lidocaine (PF)      . pantoprazole (PROTONIX) IV  40 mg Intravenous Q12H  . sevelamer carbonate  2,400 mg Oral TID WC   Continuous Infusions:  PRN Meds:.ondansetron **OR** ondansetron (ZOFRAN) IV   ASSESMENT:   *  Hematemesis.  4/12 EGD: severe esophagitis, reflux and/or candida (brushings: no candida) .  Enlarged gastric fold vs polyp: path is inflammatory polyp.  Duodenal polyp: path hyperplastic. 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   *  Ascites.  Paracentesis x 4 liters (per pt). Await fluid studies to determine source. No cirrhosis per CT 3/31.   Pt feels much better post tapl   *  Anemia.  Normocytic. On Aranesp and Ferrlecit.  Hgb stable.  Thrombocytopenia. Dates back to 2013.    *  ESRD   PLAN   *  Await ascites studies.  Change to BID po PPI.      Alexander Grant  05/11/2015, 1:56 PM Pager: [phone]

## 2015-05-11 NOTE — Procedures (Signed)
Successful US guided paracentesis from RLQ.  Yielded 4 liters of clear yllow fluid.  No immediate complications.  Pt tolerated well.   Specimen was sent for labs.  WENDY S BLAIR PA-C 05/11/2015 2:13 PM

## 2015-05-12 LAB — RENAL FUNCTION PANEL
ALBUMIN: 2.4 g/dL — AB (ref 3.5–5.0)
ANION GAP: 16 — AB (ref 5–15)
BUN: 27 mg/dL — AB (ref 6–20)
CO2: 30 mmol/L (ref 22–32)
Calcium: 9.5 mg/dL (ref 8.9–10.3)
Chloride: 91 mmol/L — ABNORMAL LOW (ref 101–111)
Creatinine, Ser: 8.52 mg/dL — ABNORMAL HIGH (ref 0.61–1.24)
GFR calc Af Amer: 7 mL/min — ABNORMAL LOW (ref >60)
GFR, EST NON AFRICAN AMERICAN: 6 mL/min — AB (ref >60)
Glucose, Bld: 82 mg/dL (ref 65–99)
PHOSPHORUS: 4.2 mg/dL (ref 2.5–4.6)
POTASSIUM: 4.3 mmol/L (ref 3.5–5.1)
Sodium: 137 mmol/L (ref 135–145)

## 2015-05-12 LAB — CBC
HCT: 35.4 % — ABNORMAL LOW (ref 39.0–52.0)
Hemoglobin: 10.8 g/dL — ABNORMAL LOW (ref 13.0–17.0)
MCH: 28.9 pg (ref 26.0–34.0)
MCHC: 30.5 g/dL (ref 30.0–36.0)
MCV: 94.7 fL (ref 78.0–100.0)
Platelets: 140 10*3/uL — ABNORMAL LOW (ref 150–400)
RBC: 3.74 MIL/uL — ABNORMAL LOW (ref 4.22–5.81)
RDW: 16.3 % — ABNORMAL HIGH (ref 11.5–15.5)
WBC: 7.6 10*3/uL (ref 4.0–10.5)

## 2015-05-12 MED ORDER — PANTOPRAZOLE SODIUM 40 MG PO TBEC: 40.0000 mg | DELAYED_RELEASE_TABLET | Freq: Two times a day (BID) | ORAL | Status: AC

## 2015-05-12 MED ORDER — NEPRO/CARBSTEADY PO LIQD: 237.0000 mL | Freq: Three times a day (TID) | ORAL | Status: AC

## 2015-05-12 MED ORDER — POLYETHYLENE GLYCOL 3350 17 G PO PACK
17.0000 g | PACK | Freq: Every day | ORAL | Status: DC
  Administered 2015-05-12: 17 g via ORAL
  Filled 2015-05-12: qty 1

## 2015-05-12 MED ORDER — CINACALCET HCL 30 MG PO TABS: 60.0000 mg | ORAL_TABLET | Freq: Every day | ORAL | Status: DC

## 2015-05-12 MED ORDER — POLYETHYLENE GLYCOL 3350 17 G PO PACK: 17.0000 g | PACK | Freq: Every day | ORAL | Status: AC

## 2015-05-12 MED ORDER — RENA-VITE PO TABS: 1.0000 | ORAL_TABLET | Freq: Every day | ORAL | Status: AC

## 2015-05-12 MED ORDER — RENA-VITE PO TABS: 1.0000 | ORAL_TABLET | Freq: Every day | ORAL | Status: DC

## 2015-05-12 MED ORDER — DARBEPOETIN ALFA 200 MCG/0.4ML IJ SOSY: 200.0000 ug | PREFILLED_SYRINGE | INTRAMUSCULAR | Status: AC

## 2015-05-12 MED ORDER — ONDANSETRON HCL 4 MG PO TABS: 4.0000 mg | ORAL_TABLET | Freq: Four times a day (QID) | ORAL | Status: DC | PRN

## 2015-05-12 MED ORDER — SEVELAMER CARBONATE 800 MG PO TABS: 2400.0000 mg | ORAL_TABLET | Freq: Three times a day (TID) | ORAL | Status: AC

## 2015-05-12 MED ORDER — NEPRO/CARBSTEADY PO LIQD: 237.0000 mL | Freq: Three times a day (TID) | ORAL | Status: DC

## 2015-05-12 MED ORDER — HYDROCORTISONE 1 % EX CREA: TOPICAL_CREAM | Freq: Four times a day (QID) | CUTANEOUS | Status: DC

## 2015-05-12 NOTE — Progress Notes (Signed)
Patient discharge teaching given, including activity, diet, follow-up appoints, and medications. Patient verbalized understanding of all discharge instructions. IV access was d/c'd. Vitals are stable. Skin is intact except as charted in most recent assessments. Pt to be escorted out by NT, to be driven home by family.  Christabelle Hanzlik, MBA, BSN, RN 

## 2015-05-12 NOTE — Procedures (Signed)
I was present at this session.  I have reviewed the session itself and made appropriate changes.  HD via [place]. Access press ok. bp low 100s.  To get 2 L off. Much lower dry  Alexander Grant L 4/15/20178:32 AM

## 2015-05-12 NOTE — Progress Notes (Signed)
Subjective:  Patient says that his symptoms are much improved after his paracentesis.  Objective: Vital signs in last 24 hours: Filed Vitals:   05/12/15 0707 05/12/15 0712 05/12/15 0715 05/12/15 0730  BP: 105/53 123/79 132/85 119/82  Pulse: 85 72 75 74  Temp: 97.5 F (36.4 C)     TempSrc: Oral     Resp: 16 18 16    Height:      Weight: 161 lb 9.6 oz (73.3 kg)     SpO2: 90% 94%     Weight change: -17 lb 6.7 oz (-7.9 kg)  Intake/Output Summary (Last 24 hours) at 05/12/15 0826 Last data filed at 05/12/15 0527  Gross per 24 hour  Intake   1002 ml  Output    100 ml  Net    902 ml   Physical Exam General: Lying in bed, NAD. Cachectic appearing. HEENT: MMM, no scleral icterus Cardiovascular: RRR, no m/r/g Pulmonary: CTAB. Unlabored breathing Abdominal: Much less distended compared to yesterday but persistent+fluid wave. Ventral hernia mildly TTP. Extremities: No lower extremity edema. No clubbing Neurological: Alert. Speech normal. Psychiatric: Normal behavior and affect.   Lab Results: Basic Metabolic Panel:  Recent Labs Lab 05/10/15 0913 05/11/15 0555 05/12/15 0420  NA 138 138 137  K 3.7 3.9 4.3  CL 93* 94* 91*  CO2 30 32 30  GLUCOSE 124* 92 82  BUN 18 16 27*  CREATININE 7.91* 6.79* 8.52*  CALCIUM 9.2 9.4 9.5  PHOS 5.4*  --  4.2   Liver Function Tests:  Recent Labs Lab 05/08/15 0035 05/09/15 0651  05/10/15 0913 05/12/15 0420  AST 20 18  --   --   --   ALT 9* 8*  --   --   --   ALKPHOS 84 79  --   --   --   BILITOT 1.7* 2.4*  --   --   --   PROT 6.7 6.4*  --   --   --   ALBUMIN 2.6* 2.3*  < > 2.4* 2.4*  < > = values in this interval not displayed.  CBC:  Recent Labs Lab 05/11/15 0555 05/12/15 0420  WBC 6.6 7.6  HGB 10.0* 10.8*  HCT 32.0* 35.4*  MCV 96.4 94.7  PLT 108* 140*   Coagulation:  Recent Labs Lab 05/08/15 1500  LABPROT 15.9*  INR 1.25   Studies/Results: [place] Art/ven Flow Abd Pelv Doppler  05/11/2015  CLINICAL DATA:   End-stage renal disease, dialysis dependent, cirrhosis, abdominal ascites and distension EXAM: DUPLEX ULTRASOUND OF LIVER TECHNIQUE: Color and duplex Doppler ultrasound was performed to evaluate the hepatic in-flow and out-flow vessels. COMPARISON:  04/27/2015, 05/11/2015 FINDINGS: Portal Vein Velocities Main:  35 cm/sec Right:  36 cm/sec Left:  45 cm/sec Hepatic Vein Velocities Right:  240 cm/sec Middle:  132 cm/sec Left:  37 cm/sec Hepatic Artery Velocity:  126 cm/sec Splenic Vein Velocity:  20 cm/sec Varices: Varices noted in the splenic hilum. Ascites: Large volume diffuse abdominal ascites noted. Portal vein is dilated with a 13 mm diameter. Portal, hepatic, and splenic veins are patent. Main portal vein and left portal vein remain hepatopetal. Right portal vein is hepatofugal. Splenic vein remains hepatopetal. All hepatic veins are patent and hepatofugal. No evidence of portal vein or splenic vein thrombus or occlusion. Cirrhotic changes noted of the liver.  Spleen is enlarged. IMPRESSION: Patent portal, hepatic and splenic veins without occlusion or thrombus. Evidence of portal hypertension with a dilated portal vein. Main portal vein and left  portal vein remain hepatopetal however the right portal vein is hepatofugal. Large volume of ascites Splenomegaly Left upper quadrant varices noted in the splenic hilum. Electronically Signed   By: Jerilynn Mages.  Shick M.D.   On: 05/11/2015 16:06   US Paracentesis  05/11/2015  INDICATION: End stage renal failure on hemodialysis. Massive ascites secondary to cirrhosis. Request for diagnostic and therapeutic paracentesis up to 4 liters maximum. EXAM: ULTRASOUND GUIDED RIGHT LOWER QUADRANT PARACENTESIS MEDICATIONS: None. COMPLICATIONS: None immediate. PROCEDURE: Informed written consent was obtained from the patient after a discussion of the risks, benefits and alternatives to treatment. A timeout was performed prior to the initiation of the procedure. Initial ultrasound scanning  demonstrates a large amount of ascites within the right lower abdominal quadrant. The right lower abdomen was prepped and draped in the usual sterile fashion. 1% lidocaine with epinephrine was used for local anesthesia. Following this, a 19 gauge, 7-cm, Yueh catheter was introduced. An ultrasound image was saved for documentation purposes. The paracentesis was performed. The catheter was removed and a dressing was applied. The patient tolerated the procedure well without immediate post procedural complication. FINDINGS: A total of approximately 4 liters of clear yellow fluid was removed. Samples were sent to the laboratory as requested by the clinical team. IMPRESSION: Successful ultrasound-guided paracentesis yielding 4 liters of peritoneal fluid. Read by:  Gareth Eagle, PA-C Electronically Signed   By: Corrie Mckusick D.O.   On: 05/11/2015 14:12   Medications: I have reviewed the patient's current medications. Scheduled Meds: . cinacalcet  60 mg Oral Q supper  . darbepoetin (ARANESP) injection - DIALYSIS  200 mcg Intravenous Q Tue-HD  . feeding supplement (NEPRO CARB STEADY)  237 mL Oral TID BM  . ferric gluconate (FERRLECIT/NULECIT) IV  125 mg Intravenous Q T,Th,Sa-HD  . hydrocortisone cream   Topical QID  . pantoprazole  40 mg Oral BID  . sevelamer carbonate  2,400 mg Oral TID WC  . sucralfate  1 g Oral TID WC & HS   Continuous Infusions:   PRN Meds:.ondansetron **OR** ondansetron (ZOFRAN) IV Assessment/Plan:  Ascites: Much improved after paracentesis yesterday, but still present and may need another therapeutic tap in the near future. SAAG <1.1 argues against portal hypertension as a cause of his ascites. Nonetheless, US dopplers noted some cirrhotic changes with dilated portal vein, splenomegaly, and LUQ varices at splenic hilum. This could be a mixed portal HTN/nephrogenic picture - Follow-up with GI as an outpatient, may need another Tx paracentesis in the future. - Hepatitis viral panel  pending  Esophagitis/Gastritis : Confirmed in EGD. Gastric polyp versus enlarged fold identified.  Patient has known history of gastritis and esophagitis in 2013. Non candida and polyp appears inflammatory. His symptoms have abated. 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  - HD session today.  DVT/PE PPx: SCD's  Dispo: Anticipated discharge today or tomorrow depending on his symptomatic improvement after paracentesis.  The patient does have a current PCP Corliss Parish, MD) and does not need an [facility] hospital follow-up appointment after discharge.  The patient does have transportation limitations that hinder transportation to clinic appointments.  .Services Needed at time of discharge: Y = Yes, Blank = No PT:   OT:   RN:   Equipment:   Other:     LOS: 4 days   Liberty Handy, MD 05/12/2015, 8:26 AM

## 2015-05-12 NOTE — Discharge Summary (Signed)
Name: Alexander Grant. MRN: [id number] DOB: 05/11/1955 60 y.o. PCP: Corliss Parish, MD  Date of Admission: 05/08/2015  7:43 AM Date of Discharge: 05/12/2015 Attending Physician: Bartholomew Crews, MD  Discharge Diagnosis: 1. Gastritis 2. Ascites 3. 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 Cocaine Use  Discharge Medications:   Medication List    TAKE these medications        acetaminophen 500 MG tablet  Commonly known as:  TYLENOL  Take 1,000 mg by mouth every 6 (six) hours as needed for mild pain.     Darbepoetin Alfa 200 MCG/0.4ML Sosy injection  Commonly known as:  ARANESP  Inject 0.4 mLs (200 mcg total) into the vein every Tuesday with hemodialysis.     feeding supplement (NEPRO CARB STEADY) Liqd  Take 237 mLs by mouth 3 (three) times daily between meals.     hydrocortisone cream 1 %  Apply topically 4 (four) times daily.     multivitamin Tabs tablet  Take 1 tablet by mouth at bedtime.     ondansetron 4 MG tablet  Commonly known as:  ZOFRAN  Take 1 tablet (4 mg total) by mouth every 6 (six) hours as needed for nausea.     pantoprazole 40 MG tablet  Commonly known as:  PROTONIX  Take 1 tablet (40 mg total) by mouth 2 (two) times daily.     polyethylene glycol packet  Commonly known as:  MIRALAX / GLYCOLAX  Take 17 g by mouth daily.     RENAGEL 800 MG tablet  Generic drug:  sevelamer  Take 2,400 mg by mouth 3 (three) times daily.     SENSIPAR 30 MG tablet  Generic drug:  cinacalcet  Take 60 mg by mouth every other day.     cinacalcet 30 MG tablet  Commonly known as:  SENSIPAR  Take 2 tablets (60 mg total) by mouth daily with supper.     sevelamer carbonate 800 MG tablet  Commonly known as:  RENVELA  Take 3 tablets (2,400 mg total) by mouth 3 (three) times daily with meals.        Disposition and follow-up:   Alexander Grant. was discharged from [facility] in  stable condition.  At the hospital follow up visit please address:  1. Patient found to have newly developing ascites. No hepatic nodularity noted on CT abdomen, but evidence of portal hypertension on US doppler with possible early cirrhosis. Nonetheless, his SAAG of 1.0 argues against portal hypertension. Regardless, this ascites caused him significant discomfort and did not respond to dialysis. He will likely require repeat therapeutic paracenteses in the future. Etiology of portal hypertension is unclear at this time.  2. Patient's primary complaint was nausea on vomiting on admission. EGD demonstrated esophagitis and gastritis similar to an EGD several years ago. He was discharged on pantoprazole 40 mg BID  3. Patient has history of non-adherence with dialysis. Please encourage him to go.  4.  Labs / imaging needed at time of follow-up: RFP  5.  Pending labs/ test needing follow-up: Hepatitis C & B.  Follow-up Appointments:     Follow-up Information    Schedule an appointment as soon as possible for a visit with [facility].   Specialty:  Gastroenterology   Contact information:   [address] [place] [phone]      Schedule an appointment as soon as possible for a visit with Louis Meckel, MD.  Specialty:  Nephrology   Contact information:   [place] [address] [phone]       Schedule an appointment as soon as possible for a visit with Charlott Rakes, MD.   Specialty:  Internal Medicine   Contact information:   [place] [address] [phone]       Discharge Instructions:  [name],   It was a pleasure taking care of you in the hospital. Your nausea and vomiting was related to a gastritis. If you take your protonix twice a day at home, this should keep this at [place].  The fluid that built up in your abdomen is called ascites. We got pictures of the vessels in your liver and it  seems these vessels can get congested, leading to a backflow into your abdomen. It seems dialysis is ineffective in clearing this fluid, so you will likely require repeated taps of this fluid into the future. Please follow up with the gastroenterologist and our Internal Medicine clinic. You can get these taps in the clinic.  Please go to all scheduled dialysis sessions and follow-up with Alexander Grant.  We also had some initial discussions regarding your goals of care, including the possibility of creating a living will. We encourage you to have these discussions with your family and physicians moving forward.   Consultations: Treatment Team:  Manus Gunning, MD Mauricia Area, MD  Procedures Performed:  Ct Abdomen Pelvis Wo Contrast  04/27/2015  CLINICAL DATA:  Dialysis patient, evaluate for ventral hernia, abdominal pain/nausea EXAM: CT ABDOMEN AND PELVIS WITHOUT CONTRAST TECHNIQUE: Multidetector CT imaging of the abdomen and pelvis was performed following the standard protocol without IV contrast. COMPARISON:  CT abdomen pelvis dated 10/02/2011 FINDINGS: Lower chest: Small to moderate right pleural effusion. Associated right lower lobe opacity, likely atelectasis. The heart is top-normal in size. No pericardial effusion. Three vessel coronary atherosclerosis. Hepatobiliary: 1.7 cm cyst in the left hepatic dome (series 3/ image 22), unchanged. Gallbladder is notable for layering sludge (series 3/image 32), without associated inflammatory changes. No intrahepatic or extrahepatic ductal dilatation. Pancreas: Within normal limits. Spleen: Within normal limits. Adrenals/Urinary Tract: Adrenal glands are within normal limits. Bilateral renal cortical atrophy.  No hydronephrosis. Bladder is underdistended/ poorly evaluated. Stomach/Bowel: Stomach is within normal limits. No evidence of bowel obstruction. Normal appendix (series 3/ image 70). Sigmoid diverticulosis, without evidence of  diverticulitis. Vascular/Lymphatic: Atherosclerotic calcifications of the abdominal aorta and branch vessels. No evidence of abdominal aortic aneurysm. No suspicious abdominopelvic lymphadenopathy. Reproductive: Prostate is grossly unremarkable. Other: Large volume abdominopelvic ascites. Small fat/fluid containing midline supraumbilical ventral hernia (series 3/ image 65). Tiny fat/ fluid containing periumbilical hernia (series 3/ image 70). Small fat/fluid containing right inguinal hernia (series 3/image 96). Subcutaneous fluid/stranding along the anterior abdominal wall (series 3/ image 83). Musculoskeletal: Mild degenerative changes of the visualized thoracolumbar spine. IMPRESSION: Large volume abdominopelvic ascites. Small fat/fluid containing midline supraumbilical ventral hernia. Small fat/fluid containing right inguinal hernia. Small moderate right pleural effusion. Layering gallbladder sludge, without associated inflammatory changes. Additional ancillary findings as above. Electronically Signed   By: Julian Hy M.D.   On: 04/27/2015 15:44   X-ray Chest Pa And Lateral  05/08/2015  CLINICAL DATA:  60 year old male with vomiting today during dialysis. Hypoxia and cough. Initial encounter. EXAM: CHEST  2 VIEW COMPARISON:  [facility] CT Abdomen and Pelvis 04/27/2015 Report of chest radiograph 02/07/2013 (no images available). FINDINGS: Low lung volumes with continued small to moderate right pleural effusion.  No superimposed pneumothorax, pulmonary edema, or confluent pulmonary opacity. Stable cardiomegaly and mediastinal contours. Visualized tracheal air column is within normal limits. No acute osseous abnormality identified. Calcified aortic atherosclerosis. Gray hazy opacity throughout the abdomen compatible with the large volume ascites seen recently by CT. IMPRESSION: 1. Small to moderate right pleural effusion appears stable since 04/27/2015. 2. No new cardiopulmonary abnormality. 3.  Ascites. Electronically Signed   By: Genevie Ann M.D.   On: 05/08/2015 11:41   [place] Art/ven Flow Abd Pelv Doppler  05/11/2015  CLINICAL DATA:  End-stage renal disease, dialysis dependent, cirrhosis, abdominal ascites and distension EXAM: DUPLEX ULTRASOUND OF LIVER TECHNIQUE: Color and duplex Doppler ultrasound was performed to evaluate the hepatic in-flow and out-flow vessels. COMPARISON:  04/27/2015, 05/11/2015 FINDINGS: Portal Vein Velocities Main:  35 cm/sec Right:  36 cm/sec Left:  45 cm/sec Hepatic Vein Velocities Right:  240 cm/sec Middle:  132 cm/sec Left:  37 cm/sec Hepatic Artery Velocity:  126 cm/sec Splenic Vein Velocity:  20 cm/sec Varices: Varices noted in the splenic hilum. Ascites: Large volume diffuse abdominal ascites noted. Portal vein is dilated with a 13 mm diameter. Portal, hepatic, and splenic veins are patent. Main portal vein and left portal vein remain hepatopetal. Right portal vein is hepatofugal. Splenic vein remains hepatopetal. All hepatic veins are patent and hepatofugal. No evidence of portal vein or splenic vein thrombus or occlusion. Cirrhotic changes noted of the liver.  Spleen is enlarged. IMPRESSION: Patent portal, hepatic and splenic veins without occlusion or thrombus. Evidence of portal hypertension with a dilated portal vein. Main portal vein and left portal vein remain hepatopetal however the right portal vein is hepatofugal. Large volume of ascites Splenomegaly Left upper quadrant varices noted in the splenic hilum. Electronically Signed   By: Jerilynn Mages.  Shick M.D.   On: 05/11/2015 16:06   US Paracentesis  05/11/2015  INDICATION: End stage renal failure on hemodialysis. Massive ascites secondary to cirrhosis. Request for diagnostic and therapeutic paracentesis up to 4 liters maximum. EXAM: ULTRASOUND GUIDED RIGHT LOWER QUADRANT PARACENTESIS MEDICATIONS: None. COMPLICATIONS: None immediate. PROCEDURE: Informed written consent was obtained from the patient after a discussion of the  risks, benefits and alternatives to treatment. A timeout was performed prior to the initiation of the procedure. Initial ultrasound scanning demonstrates a large amount of ascites within the right lower abdominal quadrant. The right lower abdomen was prepped and draped in the usual sterile fashion. 1% lidocaine with epinephrine was used for local anesthesia. Following this, a 19 gauge, 7-cm, Yueh catheter was introduced. An ultrasound image was saved for documentation purposes. The paracentesis was performed. The catheter was removed and a dressing was applied. The patient tolerated the procedure well without immediate post procedural complication. FINDINGS: A total of approximately 4 liters of clear yellow fluid was removed. Samples were sent to the laboratory as requested by the clinical team. IMPRESSION: Successful ultrasound-guided paracentesis yielding 4 liters of peritoneal fluid. Read by:  Gareth Eagle, PA-C Electronically Signed   By: Corrie Mckusick D.O.   On: 05/11/2015 14:12    Admission HPI: Mr. Jerrie Erling. is a 60 y.o. male w/ PMHx of HTN, ESRD on HD (TTS), with h/o renal biopsy significant for pauci-immune GN, and h/o substance abuse, presents to the ED after having witnessed hematemesis at HD today. Patient states he has not been feeling well for the past week or so and has had a very poor appetite and has lost 10 lbs over the past week. He just started vomiting  yesterday and says he has vomited about 3-4 times since then, but just noticed blood in his vomit today at HD. He says he drank cranberry juice this AM, however, per chart review and per patient, his vomit was heme positive. He also admits to some recent abdominal discomfort and was actually seen in the ED for this at the end of March and had a CT scan which showed large volume ascites, ventral hernia, right inguinal hernia, right pleural effusion, and gallbladder sludge. He denies any NSAID use and also denies dark stools, or  hematochezia. Patient also mentions some recent SOB over the past few days as well and was actually noted to be mildly hypoxic on RA in the ED. Denies sinusitis, congestion, rhinorrhea or epistaxis. Also denies sick contacts.   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 Also during this admission, patient had EGD and colonoscopy which showed esophagitis/gastritis (H. Pylori negative) and tubular adenomas x3, respectively. Colonoscopy was also poor prep and it was recommended that he have a repeat in 6 months, however, this was never done.    Hospital Course by problem list:  Gastritis: Patient had heme-positive vomiting at dialysis center prior to admission, but only had nausea while admitted - controlled with IV protonix and zofran. An EGD was performed which demonstrated severe gastritis and esophagitis. Esophageal scraping revealed no candida and a polyp biopsy only demonstrated inflammatory changes. It was deemed that the heme positive vomitus was likely due to a small Mallory-Weiss tear, as he had been vomiting prior to his dialysis visit.There were no varices on the EGD. By discharge, his nausea had entirely resolved. He was discharged on Protonix BID. His hemoglobin remained stable throughout the admission.   Ascites:  Patient noted to have large volume ascites, thrombocytopenia to low 100s, albumin to 2.4. He had a positive fluid wave on exam. He denied any prior history of liver disease,  hepatitis, yellow jaundice, malaria, or mononucleosis. Hepatitis screen was negative in 2013 for hepatitis B and C. HIV was non-reactive this admission. Review of previous CT abdomen from 3/31 revealed no splenomegaly or evidence of cirrhosis. This ascites was causing him significant discomfort during the admission. A diagnostic and therapeutic paracentesis was performed on 4/14 to the patient's relief. There was no sign of infection and SAAG was calculated at 1.0, not suggestive of portal hypertension. To query possible Budd-Chiari., an US doppler of the abdomen revealed. It demonstrated patent portal, hepatic and splenic veins without occlusion or thrombus. However, there was  evidence of portal hypertension with a dilated portal vein. Hepatitis viral panels were sent and pending on discharge. He continued to deny any recent alcohol use, but he reported extensive use in the past. Given that dialysis was ineffective at addressing his ascites, it was predicted that he would continue to require therapeutic paracenteses in the future. He was discharged with instructions for follow up with a gastroenterologist.    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 He did not have any swelling in his extremities or dyspnea - all fluid seemed concentrated in abdomen.  Cocaine Use: UDS positive for cocaine. He was counseled to stop using cocaine.  Discharge Vitals:   BP 99/57 mmHg  Pulse 54  Temp(Src) 97.5 F (36.4 C) (Oral)  Resp 16  Ht 6' (1.829 m)  Wt 161 lb 9.6 oz (73.3 kg)  BMI 21.91 kg/m2  SpO2 94%  Discharge Labs:  Results for orders placed or performed during the hospital encounter of 05/08/15 (from the past 24 hour(s))  Gram stain     Status: None   Collection Time: 05/11/15  1:58 PM  Result Value Ref Range   Specimen Description FLUID ASCITIC    Special Requests NONE    Gram Stain       DEGENERATED CELLULAR MATERIAL PRESENT NO WBC SEEN NO ORGANISMS SEEN    Report Status 05/11/2015 FINAL   Albumin, pleural or peritoneal fluid     Status: None   Collection Time: 05/11/15  4:52 PM  Result Value Ref Range   Albumin, Fluid 1.4 g/dL   Fluid Type-FALB FLUID   Lactate dehydrogenase (CSF, pleural or peritoneal fluid)     Status: Abnormal   Collection Time: 05/11/15  4:53 PM  Result Value Ref Range   LD, Fluid 72 (H) 3 - 23 U/L   Fluid Type-FLDH FLUID   Body fluid cell count with differential     Status: Abnormal   Collection Time: 05/11/15  4:53 PM  Result Value Ref Range   Fluid Type-FCT FLUID    Color, Fluid YELLOW YELLOW   Appearance, Fluid HAZY (A) CLEAR   WBC, Fluid 96 0 - 1000 cu mm   Neutrophil Count, Fluid 4 0 - 25 %   Lymphs, Fluid 18 %   Monocyte-Macrophage-Serous Fluid 78 50 - 90 %   Eos, Fluid 0 %   Other Cells, Fluid RARE %  Protein, fluid - pleural or peritoneal     Status: None   Collection Time: 05/11/15  6:45 PM  Result Value Ref Range   Total protein, fluid 3.3 g/dL   Fluid Type-FTP FLUID   CBC     Status: Abnormal   Collection Time: 05/12/15  4:20 AM  Result Value Ref Range   WBC 7.6 4.0 - 10.5 K/uL   RBC 3.74 (L) 4.22 - 5.81 MIL/uL   Hemoglobin 10.8 (L) 13.0 - 17.0 g/dL   HCT 35.4 (L) 39.0 - 52.0 %   MCV 94.7 78.0 - 100.0 fL   MCH 28.9 26.0 - 34.0 pg   MCHC 30.5 30.0 - 36.0 g/dL   RDW 16.3 (H) 11.5 - 15.5 %   Platelets 140 (L) 150 - 400 K/uL  Renal function panel     Status: Abnormal   Collection Time: 05/12/15  4:20 AM  Result Value Ref Range   Sodium 137 135 - 145 mmol/L   Potassium 4.3 3.5 - 5.1 mmol/L   Chloride 91 (L) 101 - 111 mmol/L   CO2 30 22 - 32 mmol/L   Glucose, Bld 82 65 - 99 mg/dL   BUN 27 (H) 6 - 20 mg/dL   Creatinine, Ser 8.52 (H) 0.61 - 1.24 mg/dL   Calcium 9.5 8.9 - 10.3 mg/dL   Phosphorus 4.2 2.5 - 4.6 mg/dL   Albumin 2.4 (L) 3.5 - 5.0 g/dL   GFR calc non Af Amer 6 (L) >60 mL/min   GFR calc Af Amer 7 (L) >  60  mL/min   Anion gap 16 (H) 5 - 15    Signed: Liberty Handy, MD 05/12/2015, 10:53 AM

## 2015-05-12 NOTE — Progress Notes (Signed)
Daily Rounding Note  05/12/2015, 8:17 AM  LOS: 4 days   SUBJECTIVE:       Constipation improved, had small formed BM today.  Appetite improved.  Abdomen still feels much better following paracentesis.  Seen during HD session  OBJECTIVE:         Vital signs in last 24 hours:    Temp:  [97.5 F (36.4 C)-99.4 F (37.4 C)] 97.5 F (36.4 C) (04/15 0707) Pulse Rate:  [72-85] 74 (04/15 0730) Resp:  [16-18] 16 (04/15 0715) BP: (105-149)/(53-90) 119/82 mmHg (04/15 0730) SpO2:  [90 %-94 %] 94 % (04/15 0712) Weight:  [71 kg (156 lb 8.4 oz)-73.3 kg (161 lb 9.6 oz)] 73.3 kg (161 lb 9.6 oz) (04/15 0707) Last BM Date: 05/10/15 Filed Weights   05/10/15 2204 05/11/15 2040 05/12/15 0707  Weight: 75 kg (165 lb 5.5 oz) 71 kg (156 lb 8.4 oz) 73.3 kg (161 lb 9.6 oz)   General: chronically ill looking but comfortable   Heart: RRR Chest: clear bil.   Abdomen: soft, large but not tender.  Active BS  Extremities: no CCE Neuro/Psych:  Oriented x 3.  Calm, no gross tremor or weakness.   Intake/Output from previous day: 04/14 0701 - 04/15 0700 In: 1002 [P.O.:1002] Out: 100 [Urine:100]  Intake/Output this shift:    Lab Results:  Recent Labs  05/10/15 0912 05/11/15 0555 05/12/15 0420  WBC 6.4 6.6 7.6  HGB 10.3* 10.0* 10.8*  HCT 32.7* 32.0* 35.4*  PLT 118* 108* 140*   BMET  Recent Labs  05/10/15 0913 05/11/15 0555 05/12/15 0420  NA 138 138 137  K 3.7 3.9 4.3  CL 93* 94* 91*  CO2 30 32 30  GLUCOSE 124* 92 82  BUN 18 16 27*  CREATININE 7.91* 6.79* 8.52*  CALCIUM 9.2 9.4 9.5   LFT  Recent Labs  05/10/15 0651 05/10/15 0913 05/12/15 0420  ALBUMIN 2.4* 2.4* 2.4*   PT/INR No results for input(s): LABPROT, INR in the last 72 hours. Hepatitis Panel No results for input(s): HEPBSAG, HCVAB, HEPAIGM, HEPBIGM in the last 72 hours.  Studies/Results: [place] Art/ven Flow Abd Pelv Doppler  05/11/2015  CLINICAL DATA:   End-stage renal disease, dialysis dependent, cirrhosis, abdominal ascites and distension EXAM: DUPLEX ULTRASOUND OF LIVER TECHNIQUE: Color and duplex Doppler ultrasound was performed to evaluate the hepatic in-flow and out-flow vessels. COMPARISON:  04/27/2015, 05/11/2015 FINDINGS: Portal Vein Velocities Main:  35 cm/sec Right:  36 cm/sec Left:  45 cm/sec Hepatic Vein Velocities Right:  240 cm/sec Middle:  132 cm/sec Left:  37 cm/sec Hepatic Artery Velocity:  126 cm/sec Splenic Vein Velocity:  20 cm/sec Varices: Varices noted in the splenic hilum. Ascites: Large volume diffuse abdominal ascites noted. Portal vein is dilated with a 13 mm diameter. Portal, hepatic, and splenic veins are patent. Main portal vein and left portal vein remain hepatopetal. Right portal vein is hepatofugal. Splenic vein remains hepatopetal. All hepatic veins are patent and hepatofugal. No evidence of portal vein or splenic vein thrombus or occlusion. Cirrhotic changes noted of the liver.  Spleen is enlarged. IMPRESSION: Patent portal, hepatic and splenic veins without occlusion or thrombus. Evidence of portal hypertension with a dilated portal vein. Main portal vein and left portal vein remain hepatopetal however the right portal vein is hepatofugal. Large volume of ascites Splenomegaly Left upper quadrant varices noted in the splenic hilum. Electronically Signed   By: Jerilynn Mages.  Shick M.D.   On: 05/11/2015 16:06  US Paracentesis  05/11/2015  INDICATION: End stage renal failure on hemodialysis. Massive ascites secondary to cirrhosis. Request for diagnostic and therapeutic paracentesis up to 4 liters maximum. EXAM: ULTRASOUND GUIDED RIGHT LOWER QUADRANT PARACENTESIS MEDICATIONS: None. COMPLICATIONS: None immediate. PROCEDURE: Informed written consent was obtained from the patient after a discussion of the risks, benefits and alternatives to treatment. A timeout was performed prior to the initiation of the procedure. Initial ultrasound scanning  demonstrates a large amount of ascites within the right lower abdominal quadrant. The right lower abdomen was prepped and draped in the usual sterile fashion. 1% lidocaine with epinephrine was used for local anesthesia. Following this, a 19 gauge, 7-cm, Yueh catheter was introduced. An ultrasound image was saved for documentation purposes. The paracentesis was performed. The catheter was removed and a dressing was applied. The patient tolerated the procedure well without immediate post procedural complication. FINDINGS: A total of approximately 4 liters of clear yellow fluid was removed. Samples were sent to the laboratory as requested by the clinical team. IMPRESSION: Successful ultrasound-guided paracentesis yielding 4 liters of peritoneal fluid. Read by:  Gareth Eagle, PA-C Electronically Signed   By: Corrie Mckusick D.O.   On: 05/11/2015 14:12   Scheduled Meds: . cinacalcet  60 mg Oral Q supper  . darbepoetin (ARANESP) injection - DIALYSIS  200 mcg Intravenous Q Tue-HD  . feeding supplement (NEPRO CARB STEADY)  237 mL Oral TID BM  . ferric gluconate (FERRLECIT/NULECIT) IV  125 mg Intravenous Q T,Th,Sa-HD  . hydrocortisone cream   Topical QID  . pantoprazole  40 mg Oral BID  . sevelamer carbonate  2,400 mg Oral TID WC  . sucralfate  1 g Oral TID WC & HS   Continuous Infusions:  PRN Meds:.ondansetron **OR** ondansetron (ZOFRAN) IV    ASSESMENT:   * Hematemesis.  4/12 EGD: severe esophagitis, reflux and/or candida (brushings: no candida) . Enlarged gastric fold vs polyp: path is inflammatory polyp. Duodenal polyp: path hyperplastic. 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 On BID oral Protonix, no PPI etc PTA.     * Ascites. Splenomegaly.  Left upper quadrant varices at splenic hilum.  No PV or splenic vein thrombosis.  Paracentesis x 4 liters (per pt). SAG is 1, arguing against portal htn however portal htn noted on doppler u/s.  No SBP.     No cirrhosis per CT 3/31.  Pt feels much better post tap.   Hepatitis serologies processing but hep ABC negative 08/2011. .    * Anemia. Normocytic.  S/p PRBC x 1.  On Aranesp and Ferrlecit. Hgb stable.  Thrombocytopenia. Dates back to 2013.     PLAN   *  Given his ESRD, there is no effective way to diurese his ascites.  He likely will require paracentesis if he reaccumulates ascites. 24 hour urine for protein, rule out nephrotic syndrome. May need liver biopsy and measurement of portal pressures to make definitive diagnosis as to cirrhosis.  GI follow up with Dr Havery Moros vs Pyrtle, we will contact pt.    Azucena Freed  05/12/2015, 8:17 AM Pager: [phone]

## 2015-05-12 NOTE — Progress Notes (Signed)
Subjective: Interval History: has no complaint of feels better.  Objective: Vital signs in last 24 hours: Temp:  [97.5 F (36.4 C)-99.4 F (37.4 C)] 97.5 F (36.4 C) (04/15 0707) Pulse Rate:  [72-85] 75 (04/15 0830) Resp:  [16-18] 16 (04/15 0715) BP: (105-149)/(53-90) 105/68 mmHg (04/15 0830) SpO2:  [90 %-94 %] 94 % (04/15 0712) Weight:  [71 kg (156 lb 8.4 oz)-73.3 kg (161 lb 9.6 oz)] 73.3 kg (161 lb 9.6 oz) (04/15 0707) Weight change: -7.9 kg (-17 lb 6.7 oz)  Intake/Output from previous day: 04/14 0701 - 04/15 0700 In: 1002 [P.O.:1002] Out: 100 [Urine:100] Intake/Output this shift:    General appearance: alert, cooperative, no distress and pale Resp: rales bibasilar Cardio: S1, S2 normal and systolic murmur: holosystolic 2/6, blowing at apex GI: pos bs,pos fw Extremities: AVF LUA   Lab Results:  Recent Labs  05/11/15 0555 05/12/15 0420  WBC 6.6 7.6  HGB 10.0* 10.8*  HCT 32.0* 35.4*  PLT 108* 140*   BMET:  Recent Labs  05/11/15 0555 05/12/15 0420  NA 138 137  K 3.9 4.3  CL 94* 91*  CO2 32 30  GLUCOSE 92 82  BUN 16 27*  CREATININE 6.79* 8.52*  CALCIUM 9.4 9.5   No results for input(s): PTH in the last 72 hours. Iron Studies: No results for input(s): IRON, TIBC, TRANSFERRIN, FERRITIN in the last 72 hours.  Studies/Results: [place] Art/ven Flow Abd Pelv Doppler  05/11/2015  CLINICAL DATA:  End-stage renal disease, dialysis dependent, cirrhosis, abdominal ascites and distension EXAM: DUPLEX ULTRASOUND OF LIVER TECHNIQUE: Color and duplex Doppler ultrasound was performed to evaluate the hepatic in-flow and out-flow vessels. COMPARISON:  04/27/2015, 05/11/2015 FINDINGS: Portal Vein Velocities Main:  35 cm/sec Right:  36 cm/sec Left:  45 cm/sec Hepatic Vein Velocities Right:  240 cm/sec Middle:  132 cm/sec Left:  37 cm/sec Hepatic Artery Velocity:  126 cm/sec Splenic Vein Velocity:  20 cm/sec Varices: Varices noted in the splenic hilum. Ascites: Large volume diffuse  abdominal ascites noted. Portal vein is dilated with a 13 mm diameter. Portal, hepatic, and splenic veins are patent. Main portal vein and left portal vein remain hepatopetal. Right portal vein is hepatofugal. Splenic vein remains hepatopetal. All hepatic veins are patent and hepatofugal. No evidence of portal vein or splenic vein thrombus or occlusion. Cirrhotic changes noted of the liver.  Spleen is enlarged. IMPRESSION: Patent portal, hepatic and splenic veins without occlusion or thrombus. Evidence of portal hypertension with a dilated portal vein. Main portal vein and left portal vein remain hepatopetal however the right portal vein is hepatofugal. Large volume of ascites Splenomegaly Left upper quadrant varices noted in the splenic hilum. Electronically Signed   By: Jerilynn Mages.  Shick M.D.   On: 05/11/2015 16:06   US Paracentesis  05/11/2015  INDICATION: End stage renal failure on hemodialysis. Massive ascites secondary to cirrhosis. Request for diagnostic and therapeutic paracentesis up to 4 liters maximum. EXAM: ULTRASOUND GUIDED RIGHT LOWER QUADRANT PARACENTESIS MEDICATIONS: None. COMPLICATIONS: None immediate. PROCEDURE: Informed written consent was obtained from the patient after a discussion of the risks, benefits and alternatives to treatment. A timeout was performed prior to the initiation of the procedure. Initial ultrasound scanning demonstrates a large amount of ascites within the right lower abdominal quadrant. The right lower abdomen was prepped and draped in the usual sterile fashion. 1% lidocaine with epinephrine was used for local anesthesia. Following this, a 19 gauge, 7-cm, Yueh catheter was introduced. An ultrasound image was saved for documentation purposes.  The paracentesis was performed. The catheter was removed and a dressing was applied. The patient tolerated the procedure well without immediate post procedural complication. FINDINGS: A total of approximately 4 liters of clear yellow fluid  was removed. Samples were sent to the laboratory as requested by the clinical team. IMPRESSION: Successful ultrasound-guided paracentesis yielding 4 liters of peritoneal fluid. Read by:  Gareth Eagle, PA-C Electronically Signed   By: Corrie Mckusick D.O.   On: 05/11/2015 14:12    I have reviewed the patient's current medications.  Assessment/Plan: 1 ESRD  For HD.  Lower vol, keep at lower wgt.Primary issue is nonadherence with therapy. Chronic uremia and vol xs. Cannot have Nephrotic syndrome with minimal urine output. 2 Anemia on esa  freq misses with missing HD tx 3 HPTH vit D,cinn 4 NONADHERENCE  5 Esophagitis stop Carafate with Al loading 6 Uremia P HD, lower vol, solute, PPI, counsel.  ??palliative care for EOL considerations  With his course of nonadherence   LOS: 4 days   Alexander Grant L 05/12/2015,8:33 AM

## 2015-05-12 NOTE — Discharge Instructions (Signed)
Alexander Grant,   It was a pleasure taking care of you in the hospital. Your nausea and vomiting was related to a gastritis. If you take your protonix twice a day at home, this should keep this at [place].  The fluid that built up in your abdomen is called ascites. We got pictures of the vessels in your liver and it seems these vessels can get congested, leading to a backflow into your abdomen. It seems dialysis is ineffective in clearing this fluid, so you will likely require repeated taps of this fluid into the future. Please follow up with the gastroenterologist and our Internal Medicine clinic. You can get these taps in the clinic.  Please go to all scheduled dialysis sessions and follow-up with Dr. Moshe Cipro.  We also had some initial discussions regarding your goals of care, including the possibility of creating a living will. We encourage you to have these discussions with your family and physicians moving forward.

## 2015-05-13 LAB — HEPATITIS B CORE ANTIBODY, TOTAL: HEP B C TOTAL AB: NEGATIVE

## 2015-05-13 LAB — HEPATITIS B SURFACE ANTIBODY,QUALITATIVE: Hep B S Ab: REACTIVE

## 2015-05-13 LAB — HCV COMMENT:

## 2015-05-13 LAB — HEPATITIS B SURFACE ANTIGEN: Hepatitis B Surface Ag: NEGATIVE

## 2015-05-13 LAB — HEPATITIS C ANTIBODY (REFLEX)

## 2015-05-14 ENCOUNTER — Telehealth: Payer: Self-pay | Admitting: *Deleted

## 2015-05-14 LAB — PATHOLOGIST SMEAR REVIEW: PATH REVIEW: REACTIVE

## 2015-05-14 NOTE — Telephone Encounter (Signed)
-----   Message from Jerene Bears, MD sent at 05/12/2015  2:12 PM EDT ----- Regarding: Followup Rollene Fare This was a [facility] patient seen in consult by [facility] this week. Needs ROV with [facility], next available Thanks JMP

## 2015-05-14 NOTE — Telephone Encounter (Signed)
Scheduled OV on 05/30/15 at 1:30 PM. Patient aware.

## 2015-05-16 LAB — CULTURE, BODY FLUID-BOTTLE

## 2015-05-16 LAB — CULTURE, BODY FLUID W GRAM STAIN -BOTTLE: Culture: NO GROWTH

## 2015-05-19 ENCOUNTER — Encounter (HOSPITAL_COMMUNITY): Payer: Self-pay

## 2015-05-19 ENCOUNTER — Emergency Department (HOSPITAL_COMMUNITY): Payer: Medicaid Other

## 2015-05-19 ENCOUNTER — Inpatient Hospital Stay (HOSPITAL_COMMUNITY)
Admission: EM | Admit: 2015-05-19 | Discharge: 2015-05-23 | DRG: 432 | Disposition: A | Discharge status: Home / Self Care | Payer: Medicaid Other | Attending: Internal Medicine | Admitting: Internal Medicine

## 2015-05-19 DIAGNOSIS — K766 Portal hypertension: Secondary | ICD-10-CM | POA: Diagnosis present

## 2015-05-19 DIAGNOSIS — I1311 Hypertensive heart and chronic kidney disease without heart failure, with stage 5 chronic kidney disease, or end stage renal disease: Secondary | ICD-10-CM | POA: Diagnosis present

## 2015-05-19 DIAGNOSIS — M3130 Wegener's granulomatosis without renal involvement: Secondary | ICD-10-CM | POA: Diagnosis present

## 2015-05-19 DIAGNOSIS — K7460 Unspecified cirrhosis of liver: Secondary | ICD-10-CM | POA: Diagnosis not present

## 2015-05-19 DIAGNOSIS — L299 Pruritus, unspecified: Secondary | ICD-10-CM | POA: Diagnosis present

## 2015-05-19 DIAGNOSIS — N2581 Secondary hyperparathyroidism of renal origin: Secondary | ICD-10-CM | POA: Diagnosis present

## 2015-05-19 DIAGNOSIS — D696 Thrombocytopenia, unspecified: Secondary | ICD-10-CM | POA: Diagnosis present

## 2015-05-19 DIAGNOSIS — I253 Aneurysm of heart: Secondary | ICD-10-CM | POA: Diagnosis present

## 2015-05-19 DIAGNOSIS — Z91030 Bee allergy status: Secondary | ICD-10-CM | POA: Diagnosis not present

## 2015-05-19 DIAGNOSIS — E875 Hyperkalemia: Secondary | ICD-10-CM | POA: Diagnosis not present

## 2015-05-19 DIAGNOSIS — M3131 Wegener's granulomatosis with renal involvement: Secondary | ICD-10-CM | POA: Diagnosis not present

## 2015-05-19 DIAGNOSIS — E46 Unspecified protein-calorie malnutrition: Secondary | ICD-10-CM | POA: Diagnosis present

## 2015-05-19 DIAGNOSIS — Z885 Allergy status to narcotic agent status: Secondary | ICD-10-CM

## 2015-05-19 DIAGNOSIS — R0600 Dyspnea, unspecified: Secondary | ICD-10-CM | POA: Diagnosis not present

## 2015-05-19 DIAGNOSIS — F1410 Cocaine abuse, uncomplicated: Secondary | ICD-10-CM | POA: Diagnosis present

## 2015-05-19 DIAGNOSIS — R300 Dysuria: Secondary | ICD-10-CM | POA: Diagnosis present

## 2015-05-19 DIAGNOSIS — D692 Other nonthrombocytopenic purpura: Secondary | ICD-10-CM | POA: Diagnosis present

## 2015-05-19 DIAGNOSIS — Z992 Dependence on renal dialysis: Secondary | ICD-10-CM | POA: Diagnosis not present

## 2015-05-19 DIAGNOSIS — R21 Rash and other nonspecific skin eruption: Secondary | ICD-10-CM

## 2015-05-19 DIAGNOSIS — Z6821 Body mass index (BMI) 21.0-21.9, adult: Secondary | ICD-10-CM

## 2015-05-19 DIAGNOSIS — R188 Other ascites: Secondary | ICD-10-CM

## 2015-05-19 DIAGNOSIS — N186 End stage renal disease: Secondary | ICD-10-CM | POA: Diagnosis present

## 2015-05-19 DIAGNOSIS — R531 Weakness: Secondary | ICD-10-CM

## 2015-05-19 DIAGNOSIS — R55 Syncope and collapse: Secondary | ICD-10-CM

## 2015-05-19 DIAGNOSIS — D649 Anemia, unspecified: Secondary | ICD-10-CM | POA: Diagnosis present

## 2015-05-19 DIAGNOSIS — K7031 Alcoholic cirrhosis of liver with ascites: Secondary | ICD-10-CM | POA: Diagnosis present

## 2015-05-19 DIAGNOSIS — Z79899 Other long term (current) drug therapy: Secondary | ICD-10-CM | POA: Diagnosis not present

## 2015-05-19 DIAGNOSIS — E8809 Other disorders of plasma-protein metabolism, not elsewhere classified: Secondary | ICD-10-CM

## 2015-05-19 DIAGNOSIS — M549 Dorsalgia, unspecified: Secondary | ICD-10-CM

## 2015-05-19 LAB — COMPREHENSIVE METABOLIC PANEL
ALBUMIN: 1.8 g/dL — AB (ref 3.5–5.0)
ALK PHOS: 87 U/L (ref 38–126)
ALT: 14 U/L — AB (ref 17–63)
ANION GAP: 17 — AB (ref 5–15)
AST: 31 U/L (ref 15–41)
BUN: 69 mg/dL — ABNORMAL HIGH (ref 6–20)
CHLORIDE: 94 mmol/L — AB (ref 101–111)
CO2: 23 mmol/L (ref 22–32)
Calcium: 9 mg/dL (ref 8.9–10.3)
Creatinine, Ser: 9.7 mg/dL — ABNORMAL HIGH (ref 0.61–1.24)
GFR calc non Af Amer: 5 mL/min — ABNORMAL LOW (ref >60)
GFR, EST AFRICAN AMERICAN: 6 mL/min — AB (ref >60)
GLUCOSE: 100 mg/dL — AB (ref 65–99)
Potassium: 5.1 mmol/L (ref 3.5–5.1)
SODIUM: 134 mmol/L — AB (ref 135–145)
Total Bilirubin: 0.7 mg/dL (ref 0.3–1.2)
Total Protein: 6 g/dL — ABNORMAL LOW (ref 6.5–8.1)

## 2015-05-19 LAB — LIPASE, BLOOD: Lipase: 51 U/L (ref 11–51)

## 2015-05-19 LAB — PROTIME-INR
INR: 1.21 (ref 0.00–1.49)
PROTHROMBIN TIME: 15.5 s — AB (ref 11.6–15.2)

## 2015-05-19 LAB — CBC
HCT: 31.4 % — ABNORMAL LOW (ref 39.0–52.0)
HEMOGLOBIN: 9.9 g/dL — AB (ref 13.0–17.0)
MCH: 29 pg (ref 26.0–34.0)
MCHC: 31.5 g/dL (ref 30.0–36.0)
MCV: 92.1 fL (ref 78.0–100.0)
PLATELETS: 133 10*3/uL — AB (ref 150–400)
RBC: 3.41 MIL/uL — AB (ref 4.22–5.81)
RDW: 17.1 % — ABNORMAL HIGH (ref 11.5–15.5)
WBC: 3.2 10*3/uL — ABNORMAL LOW (ref 4.0–10.5)

## 2015-05-19 LAB — AMMONIA: Ammonia: 26 umol/L (ref 9–35)

## 2015-05-19 LAB — I-STAT CG4 LACTIC ACID, ED: LACTIC ACID, VENOUS: 1.71 mmol/L (ref 0.5–2.0)

## 2015-05-19 MED ORDER — CINACALCET HCL 30 MG PO TABS
60.0000 mg | ORAL_TABLET | Freq: Every day | ORAL | Status: DC
  Administered 2015-05-20 – 2015-05-22 (×3): 60 mg via ORAL
  Filled 2015-05-19 (×3): qty 2

## 2015-05-19 MED ORDER — SEVELAMER CARBONATE 800 MG PO TABS
2400.0000 mg | ORAL_TABLET | Freq: Three times a day (TID) | ORAL | Status: DC
  Administered 2015-05-20 – 2015-05-23 (×9): 2400 mg via ORAL
  Filled 2015-05-19 (×10): qty 3

## 2015-05-19 MED ORDER — SODIUM CHLORIDE 0.9 % IV BOLUS (SEPSIS)
500.0000 mL | Freq: Once | INTRAVENOUS | Status: AC
  Administered 2015-05-19: 500 mL via INTRAVENOUS

## 2015-05-19 MED ORDER — POLYETHYLENE GLYCOL 3350 17 G PO PACK: 17.0000 g | PACK | Freq: Every day | ORAL | Status: DC | PRN

## 2015-05-19 MED ORDER — SODIUM CHLORIDE 0.9% FLUSH
3.0000 mL | Freq: Two times a day (BID) | INTRAVENOUS | Status: DC
  Administered 2015-05-19 – 2015-05-22 (×7): 3 mL via INTRAVENOUS

## 2015-05-19 MED ORDER — PANTOPRAZOLE SODIUM 40 MG PO TBEC
40.0000 mg | DELAYED_RELEASE_TABLET | Freq: Two times a day (BID) | ORAL | Status: DC
  Administered 2015-05-19 – 2015-05-23 (×8): 40 mg via ORAL
  Filled 2015-05-19 (×9): qty 1

## 2015-05-19 MED ORDER — OXYCODONE HCL 5 MG PO TABS
5.0000 mg | ORAL_TABLET | Freq: Four times a day (QID) | ORAL | Status: DC | PRN
  Administered 2015-05-19: 5 mg via ORAL
  Filled 2015-05-19: qty 1

## 2015-05-19 NOTE — H&P (Signed)
Date: 05/19/2015               Patient Name:  Alexander Grant. MRN: [id number]  DOB: [birth year] Age / Sex: 60 y.o., male   PCP: Corliss Parish, MD         Medical Service: Internal Medicine Teaching Service         Attending Physician: Dr. Lajean Saver, MD    First Contact: Dr. Marijean Bravo Pager: [phone]  Second Contact: Dr. Posey Pronto Pager: [phone]       After Hours (After 5p/  First Contact Pager: [phone]  weekends / holidays): Second Contact Pager: [phone]   Chief Complaint: rash, back pain, weight gain  History of Present Illness: Alexander Grant is a 60 y.o. male w/ PMHx of HTN, ESRD on HD (TTS), Granulomatosis with Polyangiitis, h/o substance abuse, and recent admission for esophagitis/gastritis, hematemesis, and ascites.  Paracentesis revealed SAAG 1.0, but doppler US of the abdomen did show a dilated portal vein c/f portal hypertension.  He was discharged on Protonix.    He presents today due to combination of factors.  First, he reports being told that if he gained 3-6 lbs, to return for repeat paracentesis.  His weight has increased from 153 on discharge to 160 lbs.  He went to dialysis today, where he was told he 10L extra fluid, but only 1.5L was taken off.  He endorses feeling unsteady on his feet, but no confusion.  He reports abdominal pain and flank/back pain consistent with his previous ascites. These pains started yesterday and are stabbing in quality.  They were previously reduced following his paracentesis.  He states he feels bloated and has a lot of gas.    He has also noticed new onset bilateral LE rash that started Thursday.  He reports sudden eruption of all lesions at the same time, though now they are spreading to his knees/thigh.  He reports that they itch, mildly relieved with topical Hydrocortisone.  He has loose stools 2/2 Miralax (for prior constipation), but denies diarrhea, melena, hematochezia, joint pain, or neck pain.  He denies fever, but has had  chills.  In the ED, patient was calm and breathing comfortably.  He was reportedly hypotensive by EMS and given 500 mL NS.  Meds: Current Facility-Administered Medications  Medication Dose Route Frequency Provider Last Rate Last Dose  . oxyCODONE (Oxy IR/ROXICODONE) immediate release tablet 5 mg  5 mg Oral Q6H PRN Juliet Rude, MD       Current Outpatient Prescriptions  Medication Sig Dispense Refill  . acetaminophen (TYLENOL) 500 MG tablet Take 1,000 mg by mouth every 6 (six) hours as needed for mild pain.    . cinacalcet (SENSIPAR) 30 MG tablet Take 60 mg by mouth every other day.     . hydrocortisone cream 1 % Apply topically 4 (four) times daily. 30 g 0  . multivitamin (RENA-VIT) TABS tablet Take 1 tablet by mouth at bedtime. 60 tablet 5  . Nutritional Supplements (FEEDING SUPPLEMENT, NEPRO CARB STEADY,) LIQD Take 237 mLs by mouth 3 (three) times daily between meals. 48 Can 5  . ondansetron (ZOFRAN) 4 MG tablet Take 1 tablet (4 mg total) by mouth every 6 (six) hours as needed for nausea. 20 tablet 0  . pantoprazole (PROTONIX) 40 MG tablet Take 1 tablet (40 mg total) by mouth 2 (two) times daily. 60 tablet 3  . polyethylene glycol (MIRALAX / GLYCOLAX) packet Take 17 g by mouth daily. (Patient taking differently: Take 17 g  by mouth daily as needed for moderate constipation. ) 14 each 0  . sevelamer carbonate (RENVELA) 800 MG tablet Take 3 tablets (2,400 mg total) by mouth 3 (three) times daily with meals. (Patient taking differently: Take 800-2,400 mg by mouth 3 (three) times daily with meals. Takes 3 tabs with each meal and 1 tab with each snack) 90 tablet 3  . cinacalcet (SENSIPAR) 30 MG tablet Take 2 tablets (60 mg total) by mouth daily with supper. 60 tablet 3  . Darbepoetin Alfa (ARANESP) 200 MCG/0.4ML SOSY injection Inject 0.4 mLs (200 mcg total) into the vein every Tuesday with hemodialysis. 1.68 mL 5    Allergies: Allergies as of 05/19/2015 - Review Complete 05/19/2015  Allergen  Reaction Noted  . Bee pollen Anaphylaxis, Shortness Of Breath, and Rash 10/01/2011  . Bee venom Anaphylaxis, Shortness Of Breath, and Rash 08/01/2013  . Percocet [oxycodone-acetaminophen] Hives and Rash 09/01/2011   Past Medical History  Diagnosis Date  . Fournier gangrene     with peri-rectal/perineal abscess drainage  . PONV (postoperative nausea and vomiting)   . Wegener's granulomatosis with renal involvement ([place])   . Anemia   . History of blood transfusion 08/2011  . Lower GI bleeding 10/01/2011  . Cocaine abuse 2013  . Tubular adenoma of colon   . Hyponatremia   . Acute renal failure ([place])   . Pulmonary infiltrate   . Reflux esophagitis   . Diverticulosis   . Right thyroid nodule   . Cardiomegaly   . Renal disorder     Dialysis  . Kidney stone   . Hypertension   . Upper GI bleed 04/2015   Past Surgical History  Procedure Laterality Date  . Esophagogastroduodenoscopy  09/10/2011    Procedure: ESOPHAGOGASTRODUODENOSCOPY (EGD);  Surgeon: Jerene Bears, MD;  Location: [facility];  Service: Gastroenterology;  Laterality: N/A;  . Colonoscopy  09/11/2011    Procedure: COLONOSCOPY;  Surgeon: Jerene Bears, MD;  Location: [facility];  Service: Gastroenterology;  Laterality: N/A;  . Knee arthroscopy w/ medial collateral ligament (mcl) repair  ~ 2000    left  . Knee arthroscopy w/ acl reconstruction  2002  . Shoulder open rotator cuff repair   09/1999    right  . Incision and drainage perirectal abscess  05/2008    'for Fournier's gangrene"  . Kidney stone surgery  1990  . Vasectomy    . Fracture surgery Right     wrist and great toe  . Av fistula placement Left 02/07/2013    Procedure: ARTERIOVENOUS (AV) FISTULA CREATION- LEFT BRACHIOCEPHALIC ;  Surgeon: Rosetta Posner, MD;  Location: [facility];  Service: Vascular;  Laterality: Left;  . Insertion of dialysis catheter N/A 02/07/2013    Procedure: INSERTION OF DIALYSIS CATHETER;  Surgeon: Rosetta Posner, MD;  Location: [facility];  Service:  Vascular;  Laterality: N/A;  . Anterior cruciate ligament repair    . Shoulder surgery Left   . Esophagogastroduodenoscopy N/A 05/09/2015    Procedure: ESOPHAGOGASTRODUODENOSCOPY (EGD);  Surgeon: Manus Gunning, MD;  Location: [facility];  Service: Gastroenterology;  Laterality: N/A;   Family History  Problem Relation Age of Onset  . Stroke Mother   . Cancer Father    Social History   Social History  . Marital Status: Divorced    Spouse Name: N/A  . Number of Children: N/A  . Years of Education: N/A   Occupational History  . Not on file.   Social History Main Topics  . Smoking  status: Never Smoker   . Smokeless tobacco: Never Used  . Alcohol Use: No     Comment: 10/01/2011 "last alcohol ~ 2012"  . Drug Use: No     Comment: as recent as 09/02/11 per toxicology screen at hospital  . Sexual Activity: Not Currently   Other Topics Concern  . Not on file   Social History Narrative   ** Merged History Encounter **        Review of Systems: Pertinent items noted in HPI and remainder of comprehensive ROS otherwise negative, excepting dysuria.  Physical Exam: Blood pressure 117/78, pulse 76, temperature 99.2 F (37.3 C), temperature source Oral, resp. rate 27, weight 158 lb (71.668 kg), SpO2 94 %. Physical Exam  Constitutional: He is oriented to person, place, and time. No distress.  HENT:  Head: Normocephalic and atraumatic.  Eyes: EOM are normal. No scleral icterus.  Neck: No JVD present. No tracheal deviation present.  Cardiovascular: Normal rate, regular rhythm, normal heart sounds and intact distal pulses.   Pulmonary/Chest: No stridor.  Decreased inspiratory effort.  Breath sounds clear bilaterally. No crackles appreciated.  Abdominal: Soft.  Abdomen distended and minimally diffusely tender to palpation.  No fluid wave appreciated.  No rash.  Musculoskeletal: He exhibits no edema.  Neurological: He is alert and oriented to person, place, and time.  Skin:  Skin is warm and dry. He is not diaphoretic.  Palpable purpura on bilateral LE extending to thigh.  Nontender.  No ulcerations.    Lab results: Basic Metabolic Panel:  Recent Labs  05/19/15 1439  NA 134*  K 5.1  CL 94*  CO2 23  GLUCOSE 100*  BUN 69*  CREATININE 9.70*  CALCIUM 9.0   Liver Function Tests:  Recent Labs  05/19/15 1439  AST 31  ALT 14*  ALKPHOS 87  BILITOT 0.7  PROT 6.0*  ALBUMIN 1.8*    Recent Labs  05/19/15 1439  LIPASE 51    Recent Labs  05/19/15 1438  AMMONIA 26   CBC:  Recent Labs  05/19/15 1439  WBC 3.2*  HGB 9.9*  HCT 31.4*  MCV 92.1  PLT 133*   Cardiac Enzymes: No results for input(s): CKTOTAL, CKMB, CKMBINDEX, TROPONINI in the last 72 hours. BNP: No results for input(s): PROBNP in the last 72 hours. D-Dimer: No results for input(s): DDIMER in the last 72 hours. CBG: No results for input(s): GLUCAP in the last 72 hours. Hemoglobin A1C: No results for input(s): HGBA1C in the last 72 hours. Fasting Lipid Panel: No results for input(s): CHOL, HDL, LDLCALC, TRIG, CHOLHDL, LDLDIRECT in the last 72 hours. Thyroid Function Tests: No results for input(s): TSH, T4TOTAL, FREET4, T3FREE, THYROIDAB in the last 72 hours. Anemia Panel: No results for input(s): VITAMINB12, FOLATE, FERRITIN, TIBC, IRON, RETICCTPCT in the last 72 hours. Coagulation:  Recent Labs  05/19/15 1439  LABPROT 15.5*  INR 1.21   Urine Drug Screen: Drugs of Abuse     Component Value Date/Time   LABOPIA NONE DETECTED 05/09/2015 1340   COCAINSCRNUR POSITIVE* 05/09/2015 1340   LABBENZ NONE DETECTED 05/09/2015 1340   AMPHETMU NONE DETECTED 05/09/2015 1340   THCU NONE DETECTED 05/09/2015 1340   LABBARB NONE DETECTED 05/09/2015 1340    Alcohol Level: No results for input(s): ETH in the last 72 hours. Urinalysis: No results for input(s): COLORURINE, LABSPEC, PHURINE, GLUCOSEU, HGBUR, BILIRUBINUR, KETONESUR, PROTEINUR, UROBILINOGEN, NITRITE, LEUKOCYTESUR in  the last 72 hours.  Invalid input(s): APPERANCEUR Misc. Labs:   Imaging results:  Dg  Chest Port 1 View  05/19/2015  CLINICAL DATA:  Pt reports SOB, rash, and abdominal distension onset today; pt receives dialysis and had treatment today; non-smoker EXAM: PORTABLE CHEST 1 VIEW COMPARISON:  05/08/2015 FINDINGS: Allowing for low lung volumes, the lungs are clear. The cardiac silhouette is normal in size. No mediastinal hilar masses or convincing adenopathy. But no convincing pleural effusion.  No pneumothorax. Bony thorax is demineralized. IMPRESSION: No acute cardiopulmonary disease. Electronically Signed   By: Lajean Manes M.D.   On: 05/19/2015 15:52    Other results: EKG: Q-waves in I and aVL.  1st degree AV block..  Assessment & Plan by Problem: Active Problems:   Wegener's granulomatosis with renal involvement ([place])   End stage renal disease ([place])   Ascites  Alexander Grant is a 60 y.o. male w/ PMHx of HTN, ESRD on HD (TTS), Granulomatosis with Polyangiitis, h/o substance abuse, and recent admission for esophagitis/gastritis, hematemesis, and ascites, presenting with new rash and recurrent ascites.  Ascites: Patient presenting with increasing abdominal distension and pain, and 7 lb weight gain.  As he was told that he would likely need repeat paracentesis, he presented to the ED.  No fevers or elevated WBC.  His pain is similar to before and likely 2/2 distension/bloating > SBP.  Patient would certainly benefit from repeat paracentesis and we will hold antibiotics until paracentesis can be performed.  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  He also reports having nasal polyps requiring surgery.  He is not on any immunosuppression for his vasculitis.  Therefore, it is unsurprising that he would also manifest cutaneous findings of his systemic disease.  No sings of ulceration or super infection of the purpura.  Patient may  benefit from punch biopsy to corroborate his previous diagnosis, but this would not be a benign test on this chronically ill and immunosuppressed patient.  No fevers or neck pain c/f meningococcemia.  He is beyond the age range for HSP and has no posterior thigh lesions. - Nephrology consult in the AM regarding possible cyclophosphamide vs other immunosuppression - Cinacalcet - Sevelamer  Dysuria: Patient reports recent dysuria, described as burning with urination when initiating stream that resolves while urinating.  He does have hypercalcemia, but denies other symptoms or blood in the urine.  Will check U/A [ ]  U/A [ ]  GC/Chlamydia  H/o Cocaine Use: [ ]  UDS  FEN/GI: - Renal  DVT Ppx: SCDs  Dispo: Disposition is deferred at this time, awaiting improvement of current medical problems. Anticipated discharge in approximately 1-2 day(s).   The patient does have a current PCP Corliss Parish, MD) and does need an [facility] hospital follow-up appointment after discharge.  The patient does not have transportation limitations that hinder transportation to clinic appointments.  Signed: Iline Oven, MD, PhD 05/19/2015, 6:13 PM

## 2015-05-19 NOTE — ED Notes (Signed)
Patient here from dialysis center after receiving full treatment today. Patient here for ongoing abdominal swelling and complains of rash to legs. No acute distress. Patient reports that he has ongoing dizziness as well

## 2015-05-19 NOTE — ED Notes (Signed)
Internal medicine here for assessment

## 2015-05-19 NOTE — ED Notes (Signed)
Attempted to call report, night RN unavailable

## 2015-05-19 NOTE — ED Provider Notes (Addendum)
CSN: [id number]     Arrival date & time 05/19/15  53 History   First MD Initiated Contact with Patient 05/19/15 1331     Chief Complaint  Patient presents with  . ascites/ dialysis patient      (Consider location/radiation/quality/duration/timing/severity/associated sxs/prior Treatment) The history is provided by the patient and the EMS personnel.  Patient w hx esrd on HD, c/o generalized weakness, near syncope, and states the ascites fluid in abdomen has also reaccumulated and notes general abdominal discomfort due to ascites.  Patient had dialysis today, denies extra fluid being taken off.  Post dialysis reports bp was 50/, EMS notes low bp, in 90s. Denies fever or chills. Pt also notes pruritic rash to bilateral lower legs since recent hospitalization. States only new meds were protonix and zofran. No vomiting or diarrhea. Denies cough or uri c/o. No chest pain.  Patient nots mild sob, states he feels the ascites pushing up on his abdomen. Denies home o2.         Past Medical History  Diagnosis Date  . Fournier gangrene     with peri-rectal/perineal abscess drainage  . PONV (postoperative nausea and vomiting)   . Wegener's granulomatosis with renal involvement ([place])   . Anemia   . History of blood transfusion 08/2011  . Lower GI bleeding 10/01/2011  . Cocaine abuse 2013  . Tubular adenoma of colon   . Hyponatremia   . Acute renal failure ([place])   . Pulmonary infiltrate   . Reflux esophagitis   . Diverticulosis   . Right thyroid nodule   . Cardiomegaly   . Renal disorder     Dialysis  . Kidney stone   . Hypertension   . Upper GI bleed 04/2015   Past Surgical History  Procedure Laterality Date  . Esophagogastroduodenoscopy  09/10/2011    Procedure: ESOPHAGOGASTRODUODENOSCOPY (EGD);  Surgeon: Jerene Bears, MD;  Location: [facility];  Service: Gastroenterology;  Laterality: N/A;  . Colonoscopy  09/11/2011    Procedure: COLONOSCOPY;  Surgeon: Jerene Bears, MD;  Location: [facility];  Service: Gastroenterology;  Laterality: N/A;  . Knee arthroscopy w/ medial collateral ligament (mcl) repair  ~ 2000    left  . Knee arthroscopy w/ acl reconstruction  2002  . Shoulder open rotator cuff repair   09/1999    right  . Incision and drainage perirectal abscess  05/2008    'for Fournier's gangrene"  . Kidney stone surgery  1990  . Vasectomy    . Fracture surgery Right     wrist and great toe  . Av fistula placement Left 02/07/2013    Procedure: ARTERIOVENOUS (AV) FISTULA CREATION- LEFT BRACHIOCEPHALIC ;  Surgeon: Rosetta Posner, MD;  Location: [facility];  Service: Vascular;  Laterality: Left;  . Insertion of dialysis catheter N/A 02/07/2013    Procedure: INSERTION OF DIALYSIS CATHETER;  Surgeon: Rosetta Posner, MD;  Location: [facility];  Service: Vascular;  Laterality: N/A;  . Anterior cruciate ligament repair    . Shoulder surgery Left   . Esophagogastroduodenoscopy N/A 05/09/2015    Procedure: ESOPHAGOGASTRODUODENOSCOPY (EGD);  Surgeon: Manus Gunning, MD;  Location: [facility];  Service: Gastroenterology;  Laterality: N/A;   Family History  Problem Relation Age of Onset  . Stroke Mother   . Cancer Father    Social History  Substance Use Topics  . Smoking status: Never Smoker   . Smokeless tobacco: Never Used  . Alcohol Use: No     Comment: 10/01/2011 "  last alcohol ~ 2012"    Review of Systems  Constitutional: Negative for fever.  HENT: Negative for sore throat.   Eyes: Negative for redness.  Respiratory: Positive for shortness of breath. Negative for cough.   Cardiovascular: Negative for chest pain.  Gastrointestinal: Negative for vomiting and diarrhea.  Genitourinary: Negative for dysuria and flank pain.  Musculoskeletal: Negative for back pain and neck pain.  Skin: Negative for rash.  Neurological: Positive for weakness and light-headedness. Negative for numbness and headaches.  Hematological: Does not bruise/bleed easily.  Psychiatric/Behavioral:  Negative for confusion.      Allergies  Bee pollen; Bee venom; Percocet; and Percocet  Home Medications   Prior to Admission medications   Medication Sig Start Date End Date Taking? Authorizing Provider  acetaminophen (TYLENOL) 500 MG tablet Take 1,000 mg by mouth every 6 (six) hours as needed for mild pain.    Historical Provider, MD  cinacalcet (SENSIPAR) 30 MG tablet Take 60 mg by mouth every other day.     Historical Provider, MD  cinacalcet (SENSIPAR) 30 MG tablet Take 2 tablets (60 mg total) by mouth daily with supper. 05/12/15   Liberty Handy, MD  Darbepoetin Alfa (ARANESP) 200 MCG/0.4ML SOSY injection Inject 0.4 mLs (200 mcg total) into the vein every Tuesday with hemodialysis. 05/12/15   Liberty Handy, MD  hydrocortisone cream 1 % Apply topically 4 (four) times daily. 05/12/15   Liberty Handy, MD  multivitamin (RENA-VIT) TABS tablet Take 1 tablet by mouth at bedtime. 05/12/15   Liberty Handy, MD  Nutritional Supplements (FEEDING SUPPLEMENT, NEPRO CARB STEADY,) LIQD Take 237 mLs by mouth 3 (three) times daily between meals. 05/12/15   Liberty Handy, MD  ondansetron (ZOFRAN) 4 MG tablet Take 1 tablet (4 mg total) by mouth every 6 (six) hours as needed for nausea. 05/12/15   Liberty Handy, MD  pantoprazole (PROTONIX) 40 MG tablet Take 1 tablet (40 mg total) by mouth 2 (two) times daily. 05/12/15   Liberty Handy, MD  polyethylene glycol [facility] / Floria Raveling) packet Take 17 g by mouth daily. 05/12/15   Liberty Handy, MD  RENAGEL 800 MG tablet Take 2,400 mg by mouth 3 (three) times daily. 03/12/15   Historical Provider, MD  sevelamer carbonate (RENVELA) 800 MG tablet Take 3 tablets (2,400 mg total) by mouth 3 (three) times daily with meals. 05/12/15   Liberty Handy, MD   There were no vitals taken for this visit. Physical Exam  Constitutional: He is oriented to person, place, and time. He appears well-developed and well-nourished. No distress.  HENT:  Mouth/Throat: Oropharynx is clear and moist.  Eyes:  Conjunctivae are normal. No scleral icterus.  Neck: Neck supple. No tracheal deviation present.  Cardiovascular: Normal rate, regular rhythm, normal heart sounds and intact distal pulses.  Exam reveals no gallop and no friction rub.   No murmur heard. Pulmonary/Chest: Effort normal and breath sounds normal. No accessory muscle usage. No respiratory distress.  Abdominal: Soft. Bowel sounds are normal.  +ascites. Distension, not tense. No focal abd tenderness. No peritoneal signs.   Genitourinary:  No cva tenderness  Musculoskeletal: Normal range of motion.  Left upper arm dialysis fistula w palp thrill.   Neurological: He is alert and oriented to person, place, and time.  Skin: Skin is warm and dry. Rash noted. He is not diaphoretic.  Petechial, non-blanching rash to bil lower ext. No mm lesions.   Psychiatric: He has a normal mood and affect.  Nursing note and vitals reviewed.   ED  Course  Procedures (including critical care time) Labs Review   Results for orders placed or performed during the hospital encounter of 05/19/15  CBC  Result Value Ref Range   WBC 3.2 (L) 4.0 - 10.5 K/uL   RBC 3.41 (L) 4.22 - 5.81 MIL/uL   Hemoglobin 9.9 (L) 13.0 - 17.0 g/dL   HCT 31.4 (L) 39.0 - 52.0 %   MCV 92.1 78.0 - 100.0 fL   MCH 29.0 26.0 - 34.0 pg   MCHC 31.5 30.0 - 36.0 g/dL   RDW 17.1 (H) 11.5 - 15.5 %   Platelets 133 (L) 150 - 400 K/uL  Protime-INR  Result Value Ref Range   Prothrombin Time 15.5 (H) 11.6 - 15.2 seconds   INR 1.21 0.00 - 1.49  I-Stat CG4 Lactic Acid, ED  Result Value Ref Range   Lactic Acid, Venous 1.71 0.5 - 2.0 mmol/L     I have personally reviewed and evaluated these images and lab results as part of my medical decision-making.   EKG Interpretation   Date/Time:  Saturday May 19 2015 14:08:41 EDT Ventricular Rate:  77 PR Interval:  209 QRS Duration: 117 QT Interval:  414 QTC Calculation: 468 R Axis:   -87 Text Interpretation:  Sinus rhythm Left  anterior fascicular block Baseline  wander No significant change since last tracing Confirmed by Ashok Cordia  MD,  Lennette Bihari ([id number]) on 05/19/2015 2:19:13 PM      MDM   Iv ns bolus. Labs.  Reviewed nursing notes and prior charts for additional history.   Patient states feels faint, about to pass out when stands. Will check orthostatic vitals.  Small iv ns bolus (given hd patient).  Room air pulse ox 88%.  Add cxr.  Cultures sent.  Lactate normal.   Given earlier hypotension, failure to thrive, weakness, recurrent ascites, new rash ?rxn to new med/protonix vs other, will admit to pcp service.   discussed with teaching service - will admit.       Lajean Saver, MD 05/19/15 [phone]

## 2015-05-19 NOTE — ED Notes (Signed)
Report to RN on 6E ,

## 2015-05-19 NOTE — ED Notes (Signed)
MD at bedside. 

## 2015-05-19 NOTE — ED Notes (Signed)
Internal medicine notified that no PA available tonight to perform U/S for paracentesis.

## 2015-05-20 ENCOUNTER — Inpatient Hospital Stay (HOSPITAL_COMMUNITY): Payer: Medicaid Other

## 2015-05-20 DIAGNOSIS — K766 Portal hypertension: Secondary | ICD-10-CM

## 2015-05-20 DIAGNOSIS — N186 End stage renal disease: Secondary | ICD-10-CM

## 2015-05-20 DIAGNOSIS — R188 Other ascites: Secondary | ICD-10-CM

## 2015-05-20 DIAGNOSIS — Z992 Dependence on renal dialysis: Secondary | ICD-10-CM

## 2015-05-20 DIAGNOSIS — M3131 Wegener's granulomatosis with renal involvement: Secondary | ICD-10-CM

## 2015-05-20 LAB — BODY FLUID CELL COUNT WITH DIFFERENTIAL
EOS FL: 0 %
LYMPHS FL: 10 %
MONOCYTE-MACROPHAGE-SEROUS FLUID: 80 % (ref 50–90)
NEUTROPHIL FLUID: 10 % (ref 0–25)
WBC FLUID: 278 uL (ref 0–1000)

## 2015-05-20 LAB — COMPREHENSIVE METABOLIC PANEL
ALT: 13 U/L — ABNORMAL LOW (ref 17–63)
ANION GAP: 15 (ref 5–15)
AST: 28 U/L (ref 15–41)
Albumin: 1.7 g/dL — ABNORMAL LOW (ref 3.5–5.0)
Alkaline Phosphatase: 94 U/L (ref 38–126)
BILIRUBIN TOTAL: 0.7 mg/dL (ref 0.3–1.2)
BUN: 80 mg/dL — AB (ref 6–20)
CHLORIDE: 94 mmol/L — AB (ref 101–111)
CO2: 25 mmol/L (ref 22–32)
Calcium: 9.1 mg/dL (ref 8.9–10.3)
Creatinine, Ser: 10.36 mg/dL — ABNORMAL HIGH (ref 0.61–1.24)
GFR, EST AFRICAN AMERICAN: 5 mL/min — AB (ref >60)
GFR, EST NON AFRICAN AMERICAN: 5 mL/min — AB (ref >60)
Glucose, Bld: 86 mg/dL (ref 65–99)
POTASSIUM: 5.2 mmol/L — AB (ref 3.5–5.1)
Sodium: 134 mmol/L — ABNORMAL LOW (ref 135–145)
TOTAL PROTEIN: 5.8 g/dL — AB (ref 6.5–8.1)

## 2015-05-20 LAB — URINALYSIS, ROUTINE W REFLEX MICROSCOPIC
Bilirubin Urine: NEGATIVE
Glucose, UA: NEGATIVE mg/dL
Ketones, ur: NEGATIVE mg/dL
Nitrite: NEGATIVE
Protein, ur: 100 mg/dL — AB
SPECIFIC GRAVITY, URINE: 1.011 (ref 1.005–1.030)
pH: 8.5 — ABNORMAL HIGH (ref 5.0–8.0)

## 2015-05-20 LAB — CBC
HCT: 32.7 % — ABNORMAL LOW (ref 39.0–52.0)
Hemoglobin: 10.1 g/dL — ABNORMAL LOW (ref 13.0–17.0)
MCH: 28.1 pg (ref 26.0–34.0)
MCHC: 30.9 g/dL (ref 30.0–36.0)
MCV: 91.1 fL (ref 78.0–100.0)
PLATELETS: 141 10*3/uL — AB (ref 150–400)
RBC: 3.59 MIL/uL — ABNORMAL LOW (ref 4.22–5.81)
RDW: 16.9 % — AB (ref 11.5–15.5)
WBC: 3.2 10*3/uL — AB (ref 4.0–10.5)

## 2015-05-20 LAB — ALBUMIN, FLUID (OTHER): ALBUMIN FL: 1.2 g/dL

## 2015-05-20 LAB — URINE MICROSCOPIC-ADD ON

## 2015-05-20 LAB — GRAM STAIN

## 2015-05-20 LAB — RAPID URINE DRUG SCREEN, HOSP PERFORMED
AMPHETAMINES: NOT DETECTED
Barbiturates: NOT DETECTED
Benzodiazepines: NOT DETECTED
Cocaine: POSITIVE — AB
OPIATES: NOT DETECTED
TETRAHYDROCANNABINOL: NOT DETECTED

## 2015-05-20 LAB — LACTATE DEHYDROGENASE, PLEURAL OR PERITONEAL FLUID: LD FL: 80 U/L — AB (ref 3–23)

## 2015-05-20 LAB — MRSA PCR SCREENING: MRSA BY PCR: POSITIVE — AB

## 2015-05-20 LAB — GLUCOSE, SEROUS FLUID: Glucose, Fluid: 91 mg/dL

## 2015-05-20 LAB — PROTEIN, BODY FLUID: Total protein, fluid: 3.1 g/dL

## 2015-05-20 MED ORDER — NEPRO/CARBSTEADY PO LIQD
237.0000 mL | Freq: Two times a day (BID) | ORAL | Status: DC
  Administered 2015-05-20 – 2015-05-21 (×3): 237 mL via ORAL

## 2015-05-20 MED ORDER — CHLORHEXIDINE GLUCONATE CLOTH 2 % EX PADS
6.0000 | MEDICATED_PAD | Freq: Every day | CUTANEOUS | Status: DC
  Administered 2015-05-20 – 2015-05-22 (×2): 6 via TOPICAL

## 2015-05-20 MED ORDER — MUPIROCIN 2 % EX OINT
1.0000 "application " | TOPICAL_OINTMENT | Freq: Two times a day (BID) | CUTANEOUS | Status: DC
  Administered 2015-05-20 – 2015-05-22 (×6): 1 via NASAL
  Filled 2015-05-20: qty 22

## 2015-05-20 MED ORDER — LIDOCAINE HCL (PF) 1 % IJ SOLN
INTRAMUSCULAR | Status: AC
  Filled 2015-05-20: qty 10

## 2015-05-20 NOTE — Procedures (Signed)
Ultrasound-guided diagnostic and therapeutic paracentesis performed yielding 4 liters of yellow colored fluid. No immediate complications.  Alexander Grant E 10:12 AM 05/20/2015

## 2015-05-20 NOTE — H&P (Signed)
Internal Medicine Attending Admission Note Date: 05/20/2015  Patient name: Alexander Grant. Medical record number: [id number] Date of birth: 1956/01/13 Age: 60 y.o. Gender: male  I saw and evaluated the patient. I reviewed the resident's note and I agree with the resident's findings and plan as documented in the resident's note.  Chief Complaint(s): Abdominal distention and lower extremity rash.  History - key components related to admission:  [name] is a 60 year old man with a history of granulomatosis with polyangiitis, end-stage renal disease on hemodialysis, portal hypertension with ascites, and history of substance abuse who presents with abdominal distention associated with a 7 pound weight gain and a pruritic bilateral lower extremity rash. Of note he was recently discharged for hospitalization where he was noted to have ascites and portal hypertension. As he is a hemodialysis patient diuretics were not an option. The plan was to have periodic outpatient paracenteses to control his ascites. Unfortunately the patient never followed through on this. Within 1 week of discharge he noted a return of his ascites and the current 7 pound weight gain. With this recurrence of ascites he has some abdominal discomfort and flank and back pain similar to his previous episode of ascites. He denies any fevers, shakes, or chills. He also notes a new bilateral lower extremity rash that began 3 days prior to admission. The lesions appeared at the same time and are pruritic. He was admitted to the internal medicine teaching service for further evaluation and care.  When seen on the morning following rounds after he received a 4 L paracentesis his abdominal pain, distention, and back pain were improved. His rash remains pruritic.  Physical Exam - key components related to admission:  Filed Vitals:   05/19/15 2245 05/19/15 2254 05/20/15 0429 05/20/15 1008  BP: 124/75 139/59 120/75   Pulse: 77 75  79   Temp: 98 F (36.7 C) 99.1 F (37.3 C) 97.6 F (36.4 C)   TempSrc: Oral Oral Oral   Resp: 20 18 18    Height:    6' (1.829 m)  Weight:   169 lb 5 oz (76.8 kg)   SpO2: 92% 94% 90%    Gen.: Thin man lying flat in bed in no acute distress. He is fully conversive, engaged, and alert and oriented 3. Abdomen: Soft, nontender, minimal ascites appreciated. Extremities: Palpable purpura to the knees bilaterally. Visually pulsating left upper extremity graft.  Lab results:  Basic Metabolic Panel:  Recent Labs  05/19/15 1439 05/20/15 0410  NA 134* 134*  K 5.1 5.2*  CL 94* 94*  CO2 23 25  GLUCOSE 100* 86  BUN 69* 80*  CREATININE 9.70* 10.36*  CALCIUM 9.0 9.1   Liver Function Tests:  Recent Labs  05/19/15 1439 05/20/15 0410  AST 31 28  ALT 14* 13*  ALKPHOS 87 94  BILITOT 0.7 0.7  PROT 6.0* 5.8*  ALBUMIN 1.8* 1.7*    Recent Labs  05/19/15 1439  LIPASE 51   CBC:  Recent Labs  05/19/15 1439 05/20/15 0410  WBC 3.2* 3.2*  HGB 9.9* 10.1*  HCT 31.4* 32.7*  MCV 92.1 91.1  PLT 133* 141*   Coagulation:  Recent Labs  05/19/15 1439  INR 1.21   Misc. Labs:  Blood cultures 2 pending Ascitic fluid culture pending Ascitic fluid Gram stain no organisms seen Ascitic fluid albumin 1.2 Ascitic fluid lactic dehydrogenase 80 Ascitic fluid total protein 3.1 Ascitic fluid glucose 91 Ascitic fluid cell count with differential pending  Imaging results:  US  Paracentesis  05/20/2015  INDICATION: With end-stage renal disease on hemodialysis and a history of cirrhosis with recurrent ascites. Request is made for diagnostic and therapeutic paracentesis. EXAM: ULTRASOUND GUIDED DIAGNOSTIC AND THERAPEUTIC PARACENTESIS MEDICATIONS: 1% lidocaine COMPLICATIONS: None immediate. PROCEDURE: Informed written consent was obtained from the patient after a discussion of the risks, benefits and alternatives to treatment. A timeout was performed prior to the initiation of the procedure.  Initial ultrasound scanning demonstrates a large amount of ascites within the left lower abdominal quadrant. The left lower abdomen was prepped and draped in the usual sterile fashion. 1% lidocaine was used for local anesthesia. Following this, a 19 gauge, 7-cm, Yueh catheter was introduced. An ultrasound image was saved for documentation purposes. The paracentesis was performed. The catheter was removed and a dressing was applied. The patient tolerated the procedure well without immediate post procedural complication. FINDINGS: A total of approximately 4 L of clear yellow fluid was removed. Samples were sent to the laboratory as requested by the clinical team. IMPRESSION: Successful ultrasound-guided paracentesis yielding 4 liters of peritoneal fluid. Read by: Saverio Danker, PA-C Electronically Signed   By: Lucrezia Europe M.D.   On: 05/20/2015 10:31   Dg Chest Port 1 View  05/19/2015  CLINICAL DATA:  Pt reports SOB, rash, and abdominal distension onset today; pt receives dialysis and had treatment today; non-smoker EXAM: PORTABLE CHEST 1 VIEW COMPARISON:  05/08/2015 FINDINGS: Allowing for low lung volumes, the lungs are clear. The cardiac silhouette is normal in size. No mediastinal hilar masses or convincing adenopathy. But no convincing pleural effusion.  No pneumothorax. Bony thorax is demineralized. IMPRESSION: No acute cardiopulmonary disease. Electronically Signed   By: Lajean Manes M.D.   On: 05/19/2015 15:52   Other results:  EKG: Normal sinus rhythm at 77 bpm, first-degree AV block, left axis deviation, left anterior hemiblock, no significant Q waves, no LVH by voltage, poor R wave progression, no ST or T-wave changes. This ECG is not significantly changed from the prior ECG on 05/08/2015.  Assessment & Plan by Problem:  [name] is a 60 year old man with a history of granulomatosis with polyangiitis, end-stage renal disease on hemodialysis, portal hypertension with ascites, and history of  substance abuse who presents with abdominal distention associated with a 7 pound weight gain and a pruritic bilateral lower extremity rash. The exact cause of his portal hypertension remains unclear but management will be via periodic outpatient paracenteses as overly aggressive volume removal during dialysis will likely lead to continued dizziness and hypotension postdialysis. A TIPS procedure may be an option in the future although the risks of hepatic encephalopathy need to be weighed with the benefits of convenience. He feels much improved after his paracentesis and our suspicion for SBP is low, although the cell count is pending at the time of this dictation. More worrisome is the palpable purpura which likely represents recurrence of his granulomatosis with polyangiitis. It appears this has not been treated since 2013.  1) Portal hypertension with ascites: We will set up outpatient paracenteses for the patient tomorrow morning. This way when he develops abdominal distention he can call interventional radiology and within the next 24-48 hours during the week can have the fluid removed prior to requiring admission to the hospital. We will follow-up on the cell count to make sure he does not have SBP.  2) Granulomatosis with polyangiitis: We will ask nephrology to assess his vasculitis and consider reinitiation of therapy. If they would prefer that another service manage this  we would be happy to consult them at that point.  3) Disposition: Once the issue of therapy for granulomatosis with polyangiitis has been addressed I anticipate he will be ready for discharge home with outpatient follow-up thereafter.

## 2015-05-20 NOTE — Progress Notes (Signed)
Critical lab - MRSA positive.  Standing orders initiated.  [employer] RN-BC, WTA.

## 2015-05-20 NOTE — Progress Notes (Signed)
Pt refused SCD for his DVT stated "I get heparin in Hemo,pt educated and verbalized understanding will continue to monitor

## 2015-05-20 NOTE — Progress Notes (Signed)
Subjective:  Patient continues to endorse discomfort from abdominal fluid, itchiness from lower extremity rash, and lower back pain. He does not complain of dysuria this AM. He indicated that he had intended to call the clinic to set up fluid draining, but he said he never got around to it. He wonders if a steroid cream can be used for his rash.   Upon further questioning, he indicated that approximately one week went by since his last paracentesis before his ascites worsened again.   Objective: Vital signs in last 24 hours: Filed Vitals:   05/19/15 1845 05/19/15 2245 05/19/15 2254 05/20/15 0429  BP: 129/81 124/75 139/59 120/75  Pulse:  77 75 79  Temp:  98 F (36.7 C) 99.1 F (37.3 C) 97.6 F (36.4 C)  TempSrc:  Oral Oral Oral  Resp: 32 20 18 18   Weight:    169 lb 5 oz (76.8 kg)  SpO2:  92% 94% 90%   Weight change:   Intake/Output Summary (Last 24 hours) at 05/20/15 1039 Last data filed at 05/20/15 0900  Gross per 24 hour  Intake    243 ml  Output      0 ml  Net    243 ml   Physical Exam: General: Lying in bed, NAD HEENT: [name], MMM, No JVD, no carotid bruits,  Cardiovascular: RRR, no m/r/g Pulmonary: Fine crackles in bases. Otherwise, CTAB. Unlabored breathing.  Abdominal: Distended. Minimal fluid wave appreciated. Normal bowel sounds. Skin: Pruritic, erythematous raised purpura from ankles to above the knee.  Neurological: Speech normal. AAOx3. No asterixis. Psychiatric: Normal affect and behavior.   Lab Results: Basic Metabolic Panel:  Recent Labs Lab 05/19/15 1439 05/20/15 0410  NA 134* 134*  K 5.1 5.2*  CL 94* 94*  CO2 23 25  GLUCOSE 100* 86  BUN 69* 80*  CREATININE 9.70* 10.36*  CALCIUM 9.0 9.1   Liver Function Tests:  Recent Labs Lab 05/19/15 1439 05/20/15 0410  AST 31 28  ALT 14* 13*  ALKPHOS 87 94  BILITOT 0.7 0.7  PROT 6.0* 5.8*  ALBUMIN 1.8* 1.7*  CBC:  Recent Labs Lab 05/19/15 1439 05/20/15 0410  WBC 3.2* 3.2*  HGB 9.9* 10.1*   HCT 31.4* 32.7*  MCV 92.1 91.1  PLT 133* 141*   Coagulation:  Recent Labs Lab 05/19/15 1439  LABPROT 15.5*  INR 1.21    Studies/Results: Dg Chest Port 1 View  05/19/2015  CLINICAL DATA:  Pt reports SOB, rash, and abdominal distension onset today; pt receives dialysis and had treatment today; non-smoker EXAM: PORTABLE CHEST 1 VIEW COMPARISON:  05/08/2015 FINDINGS: Allowing for low lung volumes, the lungs are clear. The cardiac silhouette is normal in size. No mediastinal hilar masses or convincing adenopathy. But no convincing pleural effusion.  No pneumothorax. Bony thorax is demineralized. IMPRESSION: No acute cardiopulmonary disease. Electronically Signed   By: Lajean Manes M.D.   On: 05/19/2015 15:52   Medications: I have reviewed the patient's current medications. Scheduled Meds: . Chlorhexidine Gluconate Cloth  6 each Topical Q0600  . cinacalcet  60 mg Oral Q supper  . feeding supplement (NEPRO CARB STEADY)  237 mL Oral BID BM  . lidocaine (PF)      . mupirocin ointment  1 application Nasal BID  . pantoprazole  40 mg Oral BID  . sevelamer carbonate  2,400 mg Oral TID WC  . sodium chloride flush  3 mL Intravenous Q12H   Continuous Infusions:  PRN Meds:.oxyCODONE, polyethylene glycol Assessment/Plan:  Ascites:  Patient has newly detected portal hypertension on RUQ dopplers earlier this month, which is inconsistent with SAAG of 1. Regardless of the etiology, diuretics are not an option in this gentleman given his ESRD status. He will require recurrent therapeutic paracenteses in the future. I will work with interventional radiology to schedule weekly paracenteses tomorrow. He has been seen by IR for a paracentesis, draining 4L. Studies sent to query SBP, for which I have very low clinical suspicion given that he has been afebrile without a leukocytosis. 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 While the rash on his lower extremities resembles a leukocytoclastic vasculitis, his known history of GPA significantly lowers my suspicion of this; therefore, we will defer punch biopsy at this time. He was previously on prednisone and cyclophosphamide, but the latter was stopped in 2013 for diarrhea and was never restarted. We will speak with nephrology to discuss the possibility of restarting immunosuppressive therapy. Otherwise, he reports good adherence with TTS dialysis. - HD per nephrology - Nephrology consult to discuss immunosuppressive therapy  Dysuria: No symptoms this AM. - UA pending  Cocaine Use Disorder: UDS pending  DVT PPx: SCDs  Dispo: Disposition is deferred at this time, awaiting improvement of current medical problems.  Anticipated discharge in approximately 1-2 day(s).   The patient does have a current PCP Corliss Parish, MD) and does need an [facility] hospital follow-up appointment after discharge.  The patient does have transportation limitations that hinder transportation to clinic appointments.  .Services Needed at time of discharge: Y = Yes, Blank = No PT:   OT:   RN:   Equipment:   Other:     LOS: 1 day   Liberty Handy, MD 05/20/2015, 10:39 AM

## 2015-05-21 ENCOUNTER — Other Ambulatory Visit: Payer: Self-pay

## 2015-05-21 ENCOUNTER — Other Ambulatory Visit: Payer: Self-pay | Admitting: Internal Medicine

## 2015-05-21 DIAGNOSIS — E875 Hyperkalemia: Secondary | ICD-10-CM

## 2015-05-21 DIAGNOSIS — R188 Other ascites: Secondary | ICD-10-CM

## 2015-05-21 DIAGNOSIS — F1410 Cocaine abuse, uncomplicated: Secondary | ICD-10-CM

## 2015-05-21 DIAGNOSIS — D692 Other nonthrombocytopenic purpura: Secondary | ICD-10-CM

## 2015-05-21 LAB — RENAL FUNCTION PANEL
Albumin: 1.8 g/dL — ABNORMAL LOW (ref 3.5–5.0)
Anion gap: 17 — ABNORMAL HIGH (ref 5–15)
BUN: 96 mg/dL — AB (ref 6–20)
CHLORIDE: 91 mmol/L — AB (ref 101–111)
CO2: 23 mmol/L (ref 22–32)
Calcium: 8.6 mg/dL — ABNORMAL LOW (ref 8.9–10.3)
Creatinine, Ser: 11.57 mg/dL — ABNORMAL HIGH (ref 0.61–1.24)
GFR calc Af Amer: 5 mL/min — ABNORMAL LOW (ref >60)
GFR, EST NON AFRICAN AMERICAN: 4 mL/min — AB (ref >60)
Glucose, Bld: 104 mg/dL — ABNORMAL HIGH (ref 65–99)
POTASSIUM: 5.6 mmol/L — AB (ref 3.5–5.1)
Phosphorus: 6.8 mg/dL — ABNORMAL HIGH (ref 2.5–4.6)
Sodium: 131 mmol/L — ABNORMAL LOW (ref 135–145)

## 2015-05-21 LAB — GC/CHLAMYDIA PROBE AMP (~~LOC~~) NOT AT ARMC
CHLAMYDIA, DNA PROBE: NEGATIVE
Neisseria Gonorrhea: NEGATIVE

## 2015-05-21 LAB — PATHOLOGIST SMEAR REVIEW

## 2015-05-21 LAB — POTASSIUM: Potassium: 4.9 mmol/L (ref 3.5–5.1)

## 2015-05-21 MED ORDER — TRIAMCINOLONE ACETONIDE 0.5 % EX CREA
TOPICAL_CREAM | Freq: Four times a day (QID) | CUTANEOUS | Status: DC
  Administered 2015-05-21 – 2015-05-22 (×6): via TOPICAL
  Filled 2015-05-21: qty 15

## 2015-05-21 MED ORDER — PRO-STAT SUGAR FREE PO LIQD
30.0000 mL | Freq: Two times a day (BID) | ORAL | Status: DC
  Administered 2015-05-21 – 2015-05-22 (×3): 30 mL via ORAL
  Filled 2015-05-21 (×2): qty 30

## 2015-05-21 MED ORDER — SODIUM POLYSTYRENE SULFONATE 15 GM/60ML PO SUSP
30.0000 g | Freq: Once | ORAL | Status: AC
  Administered 2015-05-21: 30 g via ORAL
  Filled 2015-05-21: qty 120

## 2015-05-21 MED ORDER — PREDNISONE 20 MG PO TABS
40.0000 mg | ORAL_TABLET | Freq: Every day | ORAL | Status: DC
  Administered 2015-05-21 – 2015-05-23 (×3): 40 mg via ORAL
  Filled 2015-05-21 (×3): qty 2

## 2015-05-21 NOTE — Progress Notes (Signed)
Subjective:  Patient reports temporary relief from paracentesis yesterday, but he reports that the fluid is still causing him problems. He is also complaining about his lower extremity rash pruritis.  Objective: Vital signs in last 24 hours: Filed Vitals:   05/20/15 1934 05/21/15 0312 05/21/15 0459 05/21/15 0947  BP: 120/79  121/77 113/73  Pulse: 72  67 78  Temp: 98.4 F (36.9 C)  97.6 F (36.4 C) 97.4 F (36.3 C)  TempSrc: Oral  Oral Oral  Resp: 18  18 18   Height:      Weight:  163 lb 9.3 oz (74.2 kg)    SpO2: 93%  94% 95%   Weight change: 5 lb 9.3 oz (2.532 kg)  Intake/Output Summary (Last 24 hours) at 05/21/15 1113 Last data filed at 05/21/15 0947  Gross per 24 hour  Intake    723 ml  Output      0 ml  Net    723 ml   Physical Exam: General: Lying in bed, appearing uncomfortable Cardiovascular: RRR, no m/r/g Pulmonary:  CTAB. Unlabored breathing.  Abdominal: Distended. Ventral hernia. Minimal fluid wave appreciated. Normal bowel sounds. Skin: Pruritic, erythematous raised purpura from ankles to above the knee.  Psychiatric: Normal affect and behavior.   Lab Results: Basic Metabolic Panel:  Recent Labs Lab 05/20/15 0410 05/21/15 0539  NA 134* 131*  K 5.2* 5.6*  CL 94* 91*  CO2 25 23  GLUCOSE 86 104*  BUN 80* 96*  CREATININE 10.36* 11.57*  CALCIUM 9.1 8.6*  PHOS  --  6.8*   Liver Function Tests:  Recent Labs Lab 05/19/15 1439 05/20/15 0410 05/21/15 0539  AST 31 28  --   ALT 14* 13*  --   ALKPHOS 87 94  --   BILITOT 0.7 0.7  --   PROT 6.0* 5.8*  --   ALBUMIN 1.8* 1.7* 1.8*  CBC:  Recent Labs Lab 05/19/15 1439 05/20/15 0410  WBC 3.2* 3.2*  HGB 9.9* 10.1*  HCT 31.4* 32.7*  MCV 92.1 91.1  PLT 133* 141*   Coagulation:  Recent Labs Lab 05/19/15 1439  LABPROT 15.5*  INR 1.21    Studies/Results: US Paracentesis  05/20/2015  INDICATION: With end-stage renal disease on hemodialysis and a history of cirrhosis with recurrent ascites.  Request is made for diagnostic and therapeutic paracentesis. EXAM: ULTRASOUND GUIDED DIAGNOSTIC AND THERAPEUTIC PARACENTESIS MEDICATIONS: 1% lidocaine COMPLICATIONS: None immediate. PROCEDURE: Informed written consent was obtained from the patient after a discussion of the risks, benefits and alternatives to treatment. A timeout was performed prior to the initiation of the procedure. Initial ultrasound scanning demonstrates a large amount of ascites within the left lower abdominal quadrant. The left lower abdomen was prepped and draped in the usual sterile fashion. 1% lidocaine was used for local anesthesia. Following this, a 19 gauge, 7-cm, Yueh catheter was introduced. An ultrasound image was saved for documentation purposes. The paracentesis was performed. The catheter was removed and a dressing was applied. The patient tolerated the procedure well without immediate post procedural complication. FINDINGS: A total of approximately 4 L of clear yellow fluid was removed. Samples were sent to the laboratory as requested by the clinical team. IMPRESSION: Successful ultrasound-guided paracentesis yielding 4 liters of peritoneal fluid. Read by: Saverio Danker, PA-C Electronically Signed   By: Lucrezia Europe M.D.   On: 05/20/2015 10:31   Dg Chest Port 1 View  05/19/2015  CLINICAL DATA:  Pt reports SOB, rash, and abdominal distension onset today; pt receives dialysis  and had treatment today; non-smoker EXAM: PORTABLE CHEST 1 VIEW COMPARISON:  05/08/2015 FINDINGS: Allowing for low lung volumes, the lungs are clear. The cardiac silhouette is normal in size. No mediastinal hilar masses or convincing adenopathy. But no convincing pleural effusion.  No pneumothorax. Bony thorax is demineralized. IMPRESSION: No acute cardiopulmonary disease. Electronically Signed   By: Lajean Manes M.D.   On: 05/19/2015 15:52   Medications: I have reviewed the patient's current medications. Scheduled Meds: . Chlorhexidine Gluconate Cloth  6  each Topical Q0600  . cinacalcet  60 mg Oral Q supper  . feeding supplement (NEPRO CARB STEADY)  237 mL Oral BID BM  . mupirocin ointment  1 application Nasal BID  . pantoprazole  40 mg Oral BID  . predniSONE  40 mg Oral Q breakfast  . sevelamer carbonate  2,400 mg Oral TID WC  . sodium chloride flush  3 mL Intravenous Q12H  . triamcinolone cream   Topical QID   Continuous Infusions:  PRN Meds:.oxyCODONE, polyethylene glycol Assessment/Plan:  Ascites:  He has been seen by IR for a paracentesis, draining 4L. Labs not consistent with SBP. It appears he is quickly symptomatic again from his fluid. We will attempt to set up him with recurring paracenteses with Interventional Radiology. If this continues to be a problem, he may benefit from a TIPS procedure, albeit with the risk of hepatic encephalopathy. While he is requesting a paracentesis today, we will hold off since he had 4L off yesterday and we want to avoid the sequelae of significant fluid shifts. 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 He was previously on prednisone and cyclophosphamide, but the latter was stopped in 2013 for diarrhea and was never restarted. We will speak with nephrologist, Dr. Lorrene Reid, to discuss the possibility of restarting cyclophosphamide, and we agreed that he would be a poor candidate.. Otherwise, he reports good adherence with TTS dialysis. - HD per nephrology - Prednisone 40 mg daily with topical triamcinolone for LE rash  Hyperkalemia: K 5.6 today. Repeat EKG shows no peaked T waves. - Kayexalate once  Cocaine Use Disorder: Cocaine positive  DVT PPx: SCDs  Dispo: Disposition is deferred at this time, awaiting improvement of current medical problems.  Anticipated discharge in approximately 0-2 day(s).   The patient does have a current PCP Corliss Parish, MD) and does need an [facility] hospital follow-up  appointment after discharge.  The patient does have transportation limitations that hinder transportation to clinic appointments.  .Services Needed at time of discharge: Y = Yes, Blank = No PT:   OT:   RN:   Equipment:   Other:     LOS: 2 days   Liberty Handy, MD 05/21/2015, 11:13 AM

## 2015-05-21 NOTE — Progress Notes (Signed)
Initial Nutrition Assessment  DOCUMENTATION CODES:   Not applicable  INTERVENTION:   -D/c Nepro Shake -30 ml Prostat BID, each supplement provides 100 kcals and 15 grams protein  NUTRITION DIAGNOSIS:   Increased nutrient needs related to chronic illness as evidenced by estimated needs.  GOAL:   Patient will meet greater than or equal to 90% of their needs  MONITOR:   PO intake, Supplement acceptance, Labs, Weight trends, Skin, I & O's  REASON FOR ASSESSMENT:   Malnutrition Screening Tool    ASSESSMENT:   Alexander Grant is a 60 year old man with a history of granulomatosis with polyangiitis, end-stage renal disease on hemodialysis, portal hypertension with ascites, and history of substance abuse who presents with abdominal distention associated with a 7 pound weight gain and a pruritic bilateral lower extremity rash.   Pt admitted with portal HTN with ascites.   Pt underwent paracentesis on 05/20/15, which removed 4 L of fluid.   Pt was out of room at time of visit. Unable to complete Nutrition-Focused physical exam at this time.   Lunch tray at bedside reveals that pt consumed about 75% of his salad. Meal completion records indicate PO: 25-100%. Pt has Nepro shakes ordered TID. Noted variable acceptance per MAR.   Reviewed wt hx, however, wt changes are difficult to assess given pt's hx of fluid retention and ascites. Also noted that pt has a tendency to leave his HD treatments early, per nephrology notes.  Labs reviewed: Na: 131, K: 5.6, Phos: 6.8.  Diet Order:  Diet renal with fluid restriction Fluid restriction:: 1200 mL Fluid; Room service appropriate?: Yes; Fluid consistency:: Thin  Skin:  Reviewed, no issues  Last BM:  05/21/15  Height:   Ht Readings from Last 1 Encounters:  05/20/15 6' (1.829 m)    Weight:   Wt Readings from Last 1 Encounters:  05/21/15 163 lb 9.3 oz (74.2 kg)    Ideal Body Weight:  80.9 kg  BMI:  Body mass index is 22.18  kg/(m^2).  Estimated Nutritional Needs:   Kcal:  2200-2400  Protein:  105-120 grams  Fluid:  per MD  EDUCATION NEEDS:   No education needs identified at this time  Alexander Grant A. Jimmye Norman, RD, LDN, CDE Pager: [phone] After hours Pager: [phone]

## 2015-05-21 NOTE — Progress Notes (Signed)
[facility] Progress Note  Assessment/Plan: 1. Portal Hypertension/Ascites: per primary. Last paracentesis 05/20/15. Aspirated 4 liters. 2. ESRD -TTS @ [facility]. H/O missed/shortened tx. K+5.6 at 0530. Will recheck stat K+ now-may need HD today for hyperkalemia. Otherwise HD on schedule tomorrow. Addendum: Repeat K+ 4.9-HD tomorrow.  3. Anemia - HGB 10.1. Last dose of ESA 05/15/15. Follow HGB for now.  4. Secondary hyperparathyroidism - Ca 8.6 C Ca 10.4 Phos 6.8. Last in-center PTH 242 (04/03/15). Cont cinacalcet, binders.  5. HTN/volume - BP controlled. No antihypertensive meds. Last wt 74.2 OP EDW 72 kg. Will attempt UFG 2-3 liters tomorrow.  6. Nutrition - Severely protein malnutrition. Albumin 1.8. Renal diet, add prostat, renal vit 7. Polysubstance Abuse: UDA positive for cocaine.   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 Past medical includes history of granulomatosis with polyangiitis, polysubstance abuse, non-adherence to HD. Patient was admitted to [facility] 05/08/15-05/12/15 after witnessed episode of hematemesis at HD center. He was worked up extensively per GI and found to have severe gastritis and esophagitis per EGD with bleeding thought to be due to small Mallory-Weiss tear. DC Hgb was 10.8. During this admission, he was found to have large volume ascites, low albumin and thrombocytopenia. He denied recent ETOH use. There was evidence of portal hypertension and dilated portal vein per [place]. Paracentesis was done and 4 liters of fluid was removed. Hepatitis panel was done and he was DC'd with instructions to F/U with GI for repeat paracentesis. He did not keep this apt and returned to ED 05/19/15 with 7 lb wt gain and new bilateral lower extremity pruritic rash with visible purpura. He received paracentesis for 4 liters and has for management of portal hypertension and ascites. He did attend  HD 03/21/15 prior to coming to hospital and stay 2 hours 13 minutes of 4 hours treatment (This is his usual behavior). He denies fever, chills, N, V,D. Does C/O mild SOB, + DOE. "I just didn't call for that apt to get this fluid taken off but my dialysis didn't remove any of it!"  Rita H. Brown NP-C 05/21/2015, 12:17 PM  [facility] [phone]  I have seen and examined this patient and agree with plan with highlighted additions. Has had 3 liter paracentesis, will get HD tomorrow. Concern about possible vasculitic rash on lower ext's. To get steroids for that.  Jahred Tatar B,MD 05/21/2015 5:29 PM   Subjective: "I feel better now that the fluid has been drained but my legs still itch!"  Objective Filed Vitals:   05/20/15 1934 05/21/15 0312 05/21/15 0459 05/21/15 0947  BP: 120/79  121/77 113/73  Pulse: 72  67 78  Temp: 98.4 F (36.9 C)  97.6 F (36.4 C) 97.4 F (36.3 C)  TempSrc: Oral  Oral Oral  Resp: 18  18 18   Height:      Weight:  74.2 kg (163 lb 9.3 oz)    SpO2: 93%  94% 95%   Physical Exam General: Cooperative, NAD Heart: S1, S2,+ S4. No M/R. SR with pacs on monitor. Mild JVD @ 30 degrees. Lungs: Bilateral breath sounds slightly decreased in bases, CTA.  Abdomen: Still has probable ascites present. Nontender with active BS. Extremities: No LE edema. Has rash bilateral LE with raised purpura present. Rash extends from knees to ankles bilaterally.  Dialysis Access: LUA AVF + bruit  Dialysis Orders: [place] TueThuSat, 4 hrs 0 min, 180NRe Optiflux, BFR  350, DFR Manual 800 mL/min, EDW 72 (kg), Dialysate 2.0 K, 2.25 Ca, UFR Profile: Profile 2, Sodium Model: None, Access: AV Fistula-Standard Heparin: NONE Aranesp: 200 mcg IV q week ( last dose 05/15/15).  Additional Objective Labs: Basic Metabolic Panel:  Recent Labs Lab 05/19/15 1439 05/20/15 0410 05/21/15 0539  NA 134* 134* 131*  K 5.1 5.2* 5.6*  CL 94* 94* 91*  CO2 23 25 23   GLUCOSE 100* 86 104*   BUN 69* 80* 96*  CREATININE 9.70* 10.36* 11.57*  CALCIUM 9.0 9.1 8.6*  PHOS  --   --  6.8*     Recent Labs Lab 05/19/15 1439 05/20/15 0410 05/21/15 0539  AST 31 28  --   ALT 14* 13*  --   ALKPHOS 87 94  --   BILITOT 0.7 0.7  --   PROT 6.0* 5.8*  --   ALBUMIN 1.8* 1.7* 1.8*    Recent Labs Lab 05/19/15 1439  LIPASE 51     Recent Labs Lab 05/19/15 1439 05/20/15 0410  WBC 3.2* 3.2*  HGB 9.9* 10.1*  HCT 31.4* 32.7*  MCV 92.1 91.1  PLT 133* 141*       Component Value Date/Time   SDES FLUID ASCITIC 05/20/2015 0930   SPECREQUEST NONE 05/20/2015 0930   CULT NO GROWTH < 24 HOURS 05/19/2015 1432   REPTSTATUS 05/20/2015 FINAL 05/20/2015 0930    Studies/Results: US Paracentesis  05/20/2015  INDICATION: With end-stage renal disease on hemodialysis and a history of cirrhosis with recurrent ascites. Request is made for diagnostic and therapeutic paracentesis. EXAM: ULTRASOUND GUIDED DIAGNOSTIC AND THERAPEUTIC PARACENTESIS MEDICATIONS: 1% lidocaine COMPLICATIONS: None immediate. PROCEDURE: Informed written consent was obtained from the patient after a discussion of the risks, benefits and alternatives to treatment. A timeout was performed prior to the initiation of the procedure. Initial ultrasound scanning demonstrates a large amount of ascites within the left lower abdominal quadrant. The left lower abdomen was prepped and draped in the usual sterile fashion. 1% lidocaine was used for local anesthesia. Following this, a 19 gauge, 7-cm, Yueh catheter was introduced. An ultrasound image was saved for documentation purposes. The paracentesis was performed. The catheter was removed and a dressing was applied. The patient tolerated the procedure well without immediate post procedural complication. FINDINGS: A total of approximately 4 L of clear yellow fluid was removed. Samples were sent to the laboratory as requested by the clinical team. IMPRESSION: Successful ultrasound-guided  paracentesis yielding 4 liters of peritoneal fluid. Read by: Saverio Danker, PA-C Electronically Signed   By: Lucrezia Europe M.D.   On: 05/20/2015 10:31   Dg Chest Port 1 View  05/19/2015  CLINICAL DATA:  Pt reports SOB, rash, and abdominal distension onset today; pt receives dialysis and had treatment today; non-smoker EXAM: PORTABLE CHEST 1 VIEW COMPARISON:  05/08/2015 FINDINGS: Allowing for low lung volumes, the lungs are clear. The cardiac silhouette is normal in size. No mediastinal hilar masses or convincing adenopathy. But no convincing pleural effusion.  No pneumothorax. Bony thorax is demineralized. IMPRESSION: No acute cardiopulmonary disease. Electronically Signed   By: Lajean Manes M.D.   On: 05/19/2015 15:52   Medications:   . Chlorhexidine Gluconate Cloth  6 each Topical Q0600  . cinacalcet  60 mg Oral Q supper  . feeding supplement (NEPRO CARB STEADY)  237 mL Oral BID BM  . mupirocin ointment  1 application Nasal BID  . pantoprazole  40 mg Oral BID  . predniSONE  40 mg Oral Q breakfast  .  sevelamer carbonate  2,400 mg Oral TID WC  . sodium chloride flush  3 mL Intravenous Q12H  . triamcinolone cream   Topical QID

## 2015-05-22 ENCOUNTER — Inpatient Hospital Stay (HOSPITAL_COMMUNITY): Payer: Medicaid Other

## 2015-05-22 DIAGNOSIS — R0600 Dyspnea, unspecified: Secondary | ICD-10-CM

## 2015-05-22 LAB — CBC
HEMATOCRIT: 36.9 % — AB (ref 39.0–52.0)
HEMOGLOBIN: 11.8 g/dL — AB (ref 13.0–17.0)
MCH: 28.6 pg (ref 26.0–34.0)
MCHC: 32 g/dL (ref 30.0–36.0)
MCV: 89.3 fL (ref 78.0–100.0)
Platelets: 171 10*3/uL (ref 150–400)
RBC: 4.13 MIL/uL — ABNORMAL LOW (ref 4.22–5.81)
RDW: 17 % — AB (ref 11.5–15.5)
WBC: 6 10*3/uL (ref 4.0–10.5)

## 2015-05-22 LAB — RENAL FUNCTION PANEL
ALBUMIN: 1.9 g/dL — AB (ref 3.5–5.0)
ANION GAP: 19 — AB (ref 5–15)
BUN: 107 mg/dL — AB (ref 6–20)
CALCIUM: 8.2 mg/dL — AB (ref 8.9–10.3)
CO2: 24 mmol/L (ref 22–32)
Chloride: 87 mmol/L — ABNORMAL LOW (ref 101–111)
Creatinine, Ser: 12.66 mg/dL — ABNORMAL HIGH (ref 0.61–1.24)
GFR calc Af Amer: 4 mL/min — ABNORMAL LOW (ref >60)
GFR, EST NON AFRICAN AMERICAN: 4 mL/min — AB (ref >60)
Glucose, Bld: 145 mg/dL — ABNORMAL HIGH (ref 65–99)
PHOSPHORUS: 7.8 mg/dL — AB (ref 2.5–4.6)
Potassium: 5.3 mmol/L — ABNORMAL HIGH (ref 3.5–5.1)
SODIUM: 130 mmol/L — AB (ref 135–145)

## 2015-05-22 LAB — ECHOCARDIOGRAM COMPLETE
Height: 72 in
WEIGHTICAEL: 2574.97 [oz_av]

## 2015-05-22 MED ORDER — SENNOSIDES-DOCUSATE SODIUM 8.6-50 MG PO TABS
1.0000 | ORAL_TABLET | Freq: Two times a day (BID) | ORAL | Status: DC
  Administered 2015-05-22 – 2015-05-23 (×2): 1 via ORAL
  Filled 2015-05-22 (×3): qty 1

## 2015-05-22 NOTE — Progress Notes (Signed)
[facility] Progress Note   Interval History (Pt discharged 4/15. 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 Past medical includes history of granulomatosis with polyangiitis, polysubstance abuse, non-adherence to HD. Admitted to [facility] 05/08/15-05/12/15 after witnessed episode of hematemesis at HD center. He was worked up extensively per GI and found to have severe gastritis and esophagitis per EGD with bleeding thought to be due to small Mallory-Weiss tear. DC Hgb was 10.8. During that admission, was found to have large volume ascites, low albumin and thrombocytopenia. Denied recent ETOH use. + evidence of portal hypertension and dilated portal vein per [place]. Paracentesis was done and 4 liters of fluid was removed. Hepatitis panel all negative, DC'd with instructions to F/U with GI for repeat paracentesis. Did not keep this appt and returned to ED 05/19/15 with 7 lb wt gain and new bilateral lower extremity pruritic rash with visible purpura. He had paracentesis for 4 liters 4/23. He did attend HD 05/19/15 prior to coming to hospital and stayed 2 hours 13 minutes of 4 hours treatment (This is his usual behavior). He denied fever, chills, N, V,D. Did C/O mild SOB, + DOE.   Subjective:  Steroids started for purpuric rash LE's with improvement (per pt) On HD now Says to have another paracentesis tomorrow  Objective  Physical Exam General: Cooperative, NAD Heart: S1, S2,+ S4.. Lungs: Bilateral breath sounds slightly decreased in bases, CTA.  Abdomen: Still has probable ascites present. Nontender with active BS. + ventral hernia reducible. Abdomen is not "tight" Extremities: Rash bilateral LE with raised purpura present. Rash extends from knees to ankles bilaterally. ? Some areas of fading. Dialysis Access: LUA AVF + bruit - currently accessed for HD  Additional Objective Labs: Basic Metabolic  Panel:  Recent Labs Lab 05/20/15 0410 05/21/15 0539 05/21/15 1304 05/22/15 0556  NA 134* 131*  --  130*  K 5.2* 5.6* 4.9 5.3*  CL 94* 91*  --  87*  CO2 25 23  --  24  GLUCOSE 86 104*  --  145*  BUN 80* 96*  --  107*  CREATININE 10.36* 11.57*  --  12.66*  CALCIUM 9.1 8.6*  --  8.2*  PHOS  --  6.8*  --  7.8*     Recent Labs Lab 05/19/15 1439 05/20/15 0410 05/21/15 0539 05/22/15 0556  AST 31 28  --   --   ALT 14* 13*  --   --   ALKPHOS 87 94  --   --   BILITOT 0.7 0.7  --   --   PROT 6.0* 5.8*  --   --   ALBUMIN 1.8* 1.7* 1.8* 1.9*    Recent Labs Lab 05/19/15 1439  LIPASE 51     Recent Labs Lab 05/19/15 1439 05/20/15 0410 05/22/15 0556  WBC 3.2* 3.2* 6.0  HGB 9.9* 10.1* 11.8*  HCT 31.4* 32.7* 36.9*  MCV 92.1 91.1 89.3  PLT 133* 141* 171   Cultures    Component Value Date/Time   SDES FLUID ASCITIC 05/20/2015 0930   SDES FLUID ASCITIC 05/20/2015 0930   SPECREQUEST NONE 05/20/2015 0930   SPECREQUEST NONE 05/20/2015 0930   CULT NO GROWTH 1 DAY 05/20/2015 0930   REPTSTATUS PENDING 05/20/2015 0930   REPTSTATUS 05/20/2015 FINAL 05/20/2015 0930    Medications:   . Chlorhexidine Gluconate Cloth  6 each Topical Q0600  . cinacalcet  60 mg Oral Q supper  .  feeding supplement (PRO-STAT SUGAR FREE 64)  30 mL Oral BID  . mupirocin ointment  1 application Nasal BID  . pantoprazole  40 mg Oral BID  . predniSONE  40 mg Oral Q breakfast  . senna-docusate  1 tablet Oral BID  . sevelamer carbonate  2,400 mg Oral TID WC  . sodium chloride flush  3 mL Intravenous Q12H  . triamcinolone cream   Topical QID   Dialysis Orders: [place] TueThuSat, 4 hrs 0 min, 180NRe Optiflux, BFR 350, DFR Manual 800 mL/min, EDW 72 (kg), Dialysate 2.0 K, 2.25 Ca, UFR Profile: Profile 2, Sodium Model: None, Access: AV Fistula-Standard Heparin: NONE Aranesp: 200 mcg IV q week ( last dose 05/15/15).  Assessment/Plan: 1. Portal Hypertension/Ascites: Hep serologies all neg. Last  paracentesis 05/20/15. Aspirated 4 liters. Says to have another tomorrow. Needs cytologies. Will need to have scheduled on a regular basis. Primary attending mentions CT abd but I don't see ordered. ? Etiology of the ascites? 2. ESRD -TTS @ [facility]. H/O missed/shortened txs. On HD now. Encourage not to sign off 3. Anemia - HGB up to 11.8. Last dose of ESA 05/15/15. Follow  for now.  4. Secondary hyperparathyroidism -  Last in-center PTH 242 (04/03/15). Cont cinacalcet, binders.  5. HTN/volume - BP controlled. No antihypertensive meds. EDW 72 kg. Pre HD weight 73.4 kg.UF goal 1.5 6. Nutrition - Severely protein malnutrition. Albumin 1.8. Renal diet, prostat, renal vit 7. Polysubstance Abuse: UDA positive for cocaine.  8. Purpuric rash LE. Palpable purpura. Probably vasculitic. H/O GPA. On topical steroids as well as prednisone 40. He feels rash is improving (looks about the same to me as it did yesterday)  Jamal Maes, MD [place] Pager 05/22/2015, 11:34 AM

## 2015-05-22 NOTE — Progress Notes (Signed)
Subjective:  Patient reports that the pruritis from his rash is improved after starting oral and topical steroids. He continues to feel uncomfortable from his abdominal fluid.  Objective: Vital signs in last 24 hours: Filed Vitals:   05/22/15 1027 05/22/15 1033 05/22/15 1042 05/22/15 1057  BP: 83/47 80/46 81/62  98/55  Pulse: 76 85 86 81  Temp:      TempSrc:      Resp:      Height:      Weight:      SpO2:       Weight change: 7 lb 4.4 oz (3.3 kg)  Intake/Output Summary (Last 24 hours) at 05/22/15 1112 Last data filed at 05/22/15 1051  Gross per 24 hour  Intake    720 ml  Output    100 ml  Net    620 ml   Physical Exam: General: Lying in bed, appearing uncomfortable Cardiovascular: RRR, no m/r/g Pulmonary:  CTAB. Unlabored breathing.  Abdominal: Distended. Ventral hernia. Minimal fluid wave appreciated. Normal bowel sounds. Skin: Pruritic, erythematous raised purpura from ankles to above the knee.  Psychiatric: Normal affect and behavior.   Lab Results: Basic Metabolic Panel:  Recent Labs Lab 05/21/15 0539 05/21/15 1304 05/22/15 0556  NA 131*  --  130*  K 5.6* 4.9 5.3*  CL 91*  --  87*  CO2 23  --  24  GLUCOSE 104*  --  145*  BUN 96*  --  107*  CREATININE 11.57*  --  12.66*  CALCIUM 8.6*  --  8.2*  PHOS 6.8*  --  7.8*   Liver Function Tests:  Recent Labs Lab 05/19/15 1439 05/20/15 0410 05/21/15 0539 05/22/15 0556  AST 31 28  --   --   ALT 14* 13*  --   --   ALKPHOS 87 94  --   --   BILITOT 0.7 0.7  --   --   PROT 6.0* 5.8*  --   --   ALBUMIN 1.8* 1.7* 1.8* 1.9*  CBC:  Recent Labs Lab 05/20/15 0410 05/22/15 0556  WBC 3.2* 6.0  HGB 10.1* 11.8*  HCT 32.7* 36.9*  MCV 91.1 89.3  PLT 141* 171   Coagulation:  Recent Labs Lab 05/19/15 1439  LABPROT 15.5*  INR 1.21    Studies/Results: No results found. Medications: I have reviewed the patient's current medications. Scheduled Meds: . Chlorhexidine Gluconate Cloth  6 each Topical  Q0600  . cinacalcet  60 mg Oral Q supper  . feeding supplement (PRO-STAT SUGAR FREE 64)  30 mL Oral BID  . mupirocin ointment  1 application Nasal BID  . pantoprazole  40 mg Oral BID  . predniSONE  40 mg Oral Q breakfast  . senna-docusate  1 tablet Oral BID  . sevelamer carbonate  2,400 mg Oral TID WC  . sodium chloride flush  3 mL Intravenous Q12H  . triamcinolone cream   Topical QID   Continuous Infusions:  PRN Meds:.oxyCODONE, polyethylene glycol Assessment/Plan:  Ascites secondary to Portal Hypertension:  The patient continues to be symptomatic from his ascites. He cannot receive a therapeutic paracentesis today due to dialysis. He has already had 4L drained this admission. I have spoken with scheduling with [facility], and they confirmed that they can do weekly outpatient paracenteses, however they cannot schedule one tomorrow. The etiology of cirrhotic liver changes (only seen on US doppler on 4/14) and portal hypertension are unknown - no significant drinking history to our knowledge and negative hepatitis panel -  at this time. The patient has not had a TTE in several years, so we we will query heart failure as a cause of cirrhosis. Patient denies any orthopnea now. 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 He was previously on prednisone and cyclophosphamide, but the latter was stopped in 2013 for diarrhea and was never restarted. I spoke with Dr. Lorrene Reid, nephrologist, and we agree on deferring cyclophosphamide and other cytotoxic agents at this time. HD today. I will also look into setting him up with a rheumatologist for his GPA.  - HD per nephrology - Prednisone 40 mg daily with topical triamcinolone for LE rash  Cocaine Use Disorder: Cocaine positive  DVT PPx: SCDs  Dispo: Disposition is deferred at this time, awaiting improvement of current medical  problems.  Anticipated discharge in approximately 1-2  The patient does have a current PCP Corliss Parish, MD) and does need an [facility] hospital follow-up appointment after discharge.  The patient does have transportation limitations that hinder transportation to clinic appointments.  .Services Needed at time of discharge: Y = Yes, Blank = No PT:   OT:   RN:   Equipment:   Other:     LOS: 3 days   Liberty Handy, MD 05/22/2015, 11:12 AM

## 2015-05-22 NOTE — Progress Notes (Signed)
Patient arrived to unit per bed.  Reviewed treatment plan and this RN agrees.  Report received from bedside RN, Ulice Dash.  Consent verified.  Patient A & O X 4. Lung sounds clear to ausculation in all fields. No edema. Cardiac: NSR.  Prepped LUAVF with alcohol and cannulated with two 15 gauge needles.  Pulsation of blood noted.  Flushed access well with saline per protocol.  Connected and secured lines and initiated tx at 0927.  UF goal of 3000 mL and net fluid removal of 2500 mL.  Will continue to monitor.

## 2015-05-22 NOTE — Progress Notes (Addendum)
Late note: Patient is noncompliant using call light and is getting out of the bed, reinforced the importance of the using the call light and to wait for someone to help, but he says he can help himself and don't need any help. Bed alarm on.

## 2015-05-22 NOTE — Procedures (Signed)
I have personally attended this patient's dialysis session.    L AVF 350 2K bath 1 liter goal - out of UF d/t soft BP  Jamal Maes, MD [place] Pager 05/22/2015, 11:39 AM

## 2015-05-22 NOTE — Progress Notes (Signed)
  Echocardiogram 2D Echocardiogram has been performed.  Alexander Grant 05/22/2015, 4:51 PM

## 2015-05-22 NOTE — Progress Notes (Signed)
Dialysis treatment terminated early per nephrologist due to clotted dialyzer; pt completed 3.75 of 4 hour treatment.  1000 mL ultrafiltrated and net fluid removal 400 mL.    Patient status unchanged. Lung sounds clear to ausculation in all fields. No edema. Cardiac: NSR.  Disconnected lines and removed needles.  Pressure held for 15 minutes and band aid/gauze dressing applied.  Report given to bedside RN, Ulice Dash.

## 2015-05-23 ENCOUNTER — Inpatient Hospital Stay (HOSPITAL_COMMUNITY): Payer: Medicaid Other

## 2015-05-23 DIAGNOSIS — K7460 Unspecified cirrhosis of liver: Secondary | ICD-10-CM

## 2015-05-23 MED ORDER — LIDOCAINE HCL (PF) 1 % IJ SOLN
INTRAMUSCULAR | Status: AC
  Filled 2015-05-23: qty 10

## 2015-05-23 MED ORDER — HYDROCORTISONE 1 % EX CREA: TOPICAL_CREAM | Freq: Four times a day (QID) | CUTANEOUS | Status: AC

## 2015-05-23 MED ORDER — PRO-STAT SUGAR FREE PO LIQD: 30.0000 mL | Freq: Two times a day (BID) | ORAL | Status: AC

## 2015-05-23 MED ORDER — PREDNISONE 20 MG PO TABS: ORAL_TABLET | ORAL | Status: DC

## 2015-05-23 NOTE — Progress Notes (Signed)
Subjective:  Patient reports that the pruritis from his rash is improved after starting oral and topical steroids. He continues to feel uncomfortable from his abdominal fluid.  Objective: Vital signs in last 24 hours: Filed Vitals:   05/22/15 1318 05/22/15 2020 05/23/15 0515 05/23/15 0951  BP: 106/69 130/80 137/84 121/73  Pulse: 86 77 81   Temp:  97.5 F (36.4 C) 97.5 F (36.4 C) 97.5 F (36.4 C)  TempSrc:  Oral Axillary Oral  Resp:  17 18 18   Height:      Weight:      SpO2:  95% 95% 96%   Weight change: -9 lb 0.6 oz (-4.1 kg)  Intake/Output Summary (Last 24 hours) at 05/23/15 1035 Last data filed at 05/23/15 0900  Gross per 24 hour  Intake   1560 ml  Output    400 ml  Net   1160 ml   Physical Exam: General: Sitting up in bed, NAD Cardiovascular: RRR, no m/r/g Pulmonary:  CTAB. Unlabored breathing.  Abdominal: Distended. Ventral hernia. Minimal fluid wave appreciated. Normal bowel sounds. Skin: Pruritic, erythematous raised purpura from ankles to above the knee, not significantly improved from yesterday. Psychiatric: Normal affect and behavior.   Lab Results: Basic Metabolic Panel:  Recent Labs Lab 05/21/15 0539 05/21/15 1304 05/22/15 0556  NA 131*  --  130*  K 5.6* 4.9 5.3*  CL 91*  --  87*  CO2 23  --  24  GLUCOSE 104*  --  145*  BUN 96*  --  107*  CREATININE 11.57*  --  12.66*  CALCIUM 8.6*  --  8.2*  PHOS 6.8*  --  7.8*   Liver Function Tests:  Recent Labs Lab 05/19/15 1439 05/20/15 0410 05/21/15 0539 05/22/15 0556  AST 31 28  --   --   ALT 14* 13*  --   --   ALKPHOS 87 94  --   --   BILITOT 0.7 0.7  --   --   PROT 6.0* 5.8*  --   --   ALBUMIN 1.8* 1.7* 1.8* 1.9*  CBC:  Recent Labs Lab 05/20/15 0410 05/22/15 0556  WBC 3.2* 6.0  HGB 10.1* 11.8*  HCT 32.7* 36.9*  MCV 91.1 89.3  PLT 141* 171   Coagulation:  Recent Labs Lab 05/19/15 1439  LABPROT 15.5*  INR 1.21    Studies/Results: No results found. Medications: I have  reviewed the patient's current medications. Scheduled Meds: . Chlorhexidine Gluconate Cloth  6 each Topical Q0600  . cinacalcet  60 mg Oral Q supper  . feeding supplement (PRO-STAT SUGAR FREE 64)  30 mL Oral BID  . mupirocin ointment  1 application Nasal BID  . pantoprazole  40 mg Oral BID  . predniSONE  40 mg Oral Q breakfast  . senna-docusate  1 tablet Oral BID  . sevelamer carbonate  2,400 mg Oral TID WC  . sodium chloride flush  3 mL Intravenous Q12H  . triamcinolone cream   Topical QID   Continuous Infusions:  PRN Meds:.oxyCODONE, polyethylene glycol Assessment/Plan:  Ascites secondary to Portal Hypertension:  Therapeutic paracentesis today. I will schedule the patient with [facility] for weekly paracenteses. The etiology of cirrhotic liver changes (only seen on US doppler on 4/14) and portal hypertension are unknown. TTE is unremarkable. Upon speaking with the patient's daughter, he in fact may have had an extensive drinking history, despite the patient's suggestion otherwise. 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 Not significantly improved on exam, but the patient objectively feels better. I will also look into setting him up with a rheumatologist for his GPA.  - HD per nephrology - Prednisone 40 mg daily with topical triamcinolone for LE rash  Cocaine Use Disorder: Cocaine positive  DVT PPx: SCDs  Dispo: Anticipated discharge home today.  The patient does have a current PCP Corliss Parish, MD) and does need an [facility] hospital follow-up appointment after discharge.  The patient does have transportation limitations that hinder transportation to clinic appointments.  .Services Needed at time of discharge: Y = Yes, Blank = No PT:   OT:   RN:   Equipment:   Other:     LOS: 4 days   Liberty Handy, MD 05/23/2015, 10:35 AM

## 2015-05-23 NOTE — Progress Notes (Signed)
Patient refusing bed alarm this morning. Re-educated patient on importance and reasoning behind bed alarm, patient still refused. Patient also refusing non skid socks. Bed in lowest position and call bell in reach. Instructed patient to please use call bell if needed to get out of bed. Pt verbalized understanding. Will continue to monitor.

## 2015-05-23 NOTE — Discharge Summary (Signed)
Name: Alexander Grant. MRN: [id number] DOB: 09-15-1955 60 y.o. PCP: Corliss Parish, MD  Date of Admission: 05/19/2015  1:28 PM Date of Discharge: 05/23/2015 Attending Physician: Aldine Contes, MD  Discharge Diagnosis: 1. Ascites 2. Portal Hypertension 3. Cirrhosis 4. ESRD 5. Lower Extremity Palpable Purpura 6. Granulomatosis with Polyangiitis 7. Cocaine Use   Discharge Medications:   Medication List    STOP taking these medications        acetaminophen 500 MG tablet  Commonly known as:  TYLENOL      TAKE these medications        Darbepoetin Alfa 200 MCG/0.4ML Sosy injection  Commonly known as:  ARANESP  Inject 0.4 mLs (200 mcg total) into the vein every Tuesday with hemodialysis.     feeding supplement (NEPRO CARB STEADY) Liqd  Take 237 mLs by mouth 3 (three) times daily between meals.     feeding supplement (PRO-STAT SUGAR FREE 64) Liqd  Take 30 mLs by mouth 2 (two) times daily.     hydrocortisone cream 1 %  Apply topically 4 (four) times daily.     multivitamin Tabs tablet  Take 1 tablet by mouth at bedtime.     ondansetron 4 MG tablet  Commonly known as:  ZOFRAN  Take 1 tablet (4 mg total) by mouth every 6 (six) hours as needed for nausea.     pantoprazole 40 MG tablet  Commonly known as:  PROTONIX  Take 1 tablet (40 mg total) by mouth 2 (two) times daily.     polyethylene glycol packet  Commonly known as:  MIRALAX / GLYCOLAX  Take 17 g by mouth daily.     predniSONE 20 MG tablet  Commonly known as:  DELTASONE  Take 2 tablets daily for 3 days. Then take 1 tablet daily for 2 days.     SENSIPAR 30 MG tablet  Generic drug:  cinacalcet  Take 60 mg by mouth every other day.     sevelamer carbonate 800 MG tablet  Commonly known as:  RENVELA  Take 3 tablets (2,400 mg total) by mouth 3 (three) times daily with meals.        Disposition and follow-up:   Alexander Grant. was discharged from [facility]  in stable condition.  At the hospital follow up visit please address:  1.  Patient was discharged with plans for weekly therapeutic paracenteses. Please ensure he is going to these sessions, otherwise he will be readmitted  2. Alexander Grant has GPA presenting with palpable purpura on lower extremities this admission. This was treated with steroids. Please assess for symptom resolution and consider establishing with rheumatologist if patient is willing.   3.  Labs / imaging needed at time of follow-up: Consider CT abdomen one month from this discharge to query causes of his ascites  4.  Pending labs/ test needing follow-up: Cytology of Ascitic fluid  Follow-up Appointments:     Follow-up Information    Follow up with [facility]. Go on 05/28/2015.   Why:  At 12:45 PM   Contact information:   [address] [phone] Ask to be connected to Radiology      Call Louis Meckel, MD.   Specialty:  Nephrology   Contact information:   [place] [address] [phone]       Follow up with Liberty Handy, MD. Go on 05/30/2015.   Specialty:  Internal Medicine   Why:  3:15 pm  appointment   Contact information:   [place] [id number] [phone]       Schedule an appointment as soon as possible for a visit with Hennie Duos, MD.   Specialty:  Rheumatology   Contact information:   [place] [address] [phone]       Discharge Instructions: Discharge Instructions    Diet - low sodium heart healthy    Complete by:  As directed      Increase activity slowly    Complete by:  As directed           Alexander Grant,  It was a pleasure taking care of you in the hospital.  For your fluid buildup in your abdomen, you will need drainings once a week. If you do not follow up on this you will end up back in the hospital. I have scheduled you an appointment at 12:45 PM on Monday 05/28/2015 at  this Hospital Radiology Department at [facility] to have this done. It is absolutely necessary that you make this appointment.   You will follow up with me, Dr. Marijean Bravo, in clinic next Wednesday 05/30/15 to follow up on your fluid and your rash. We may make a referral to a rheumatologist, Dr. Amil Amen. I will also schedule you an outpatient CT scan at that time.  For your rash, please take the prednisone 20 mg two tablets daily for three days, then take 1 tablet daily for two days.  Go to all of your dialysis sessions!  Consultations: Treatment Team:  Jamal Maes, MD  Procedures Performed:  Ct Abdomen Pelvis Wo Contrast  04/27/2015  CLINICAL DATA:  Dialysis patient, evaluate for ventral hernia, abdominal pain/nausea EXAM: CT ABDOMEN AND PELVIS WITHOUT CONTRAST TECHNIQUE: Multidetector CT imaging of the abdomen and pelvis was performed following the standard protocol without IV contrast. COMPARISON:  CT abdomen pelvis dated 10/02/2011 FINDINGS: Lower chest: Small to moderate right pleural effusion. Associated right lower lobe opacity, likely atelectasis. The heart is top-normal in size. No pericardial effusion. Three vessel coronary atherosclerosis. Hepatobiliary: 1.7 cm cyst in the left hepatic dome (series 3/ image 22), unchanged. Gallbladder is notable for layering sludge (series 3/image 32), without associated inflammatory changes. No intrahepatic or extrahepatic ductal dilatation. Pancreas: Within normal limits. Spleen: Within normal limits. Adrenals/Urinary Tract: Adrenal glands are within normal limits. Bilateral renal cortical atrophy.  No hydronephrosis. Bladder is underdistended/ poorly evaluated. Stomach/Bowel: Stomach is within normal limits. No evidence of bowel obstruction. Normal appendix (series 3/ image 70). Sigmoid diverticulosis, without evidence of diverticulitis. Vascular/Lymphatic: Atherosclerotic calcifications of the abdominal aorta and branch vessels. No evidence of abdominal aortic  aneurysm. No suspicious abdominopelvic lymphadenopathy. Reproductive: Prostate is grossly unremarkable. Other: Large volume abdominopelvic ascites. Small fat/fluid containing midline supraumbilical ventral hernia (series 3/ image 65). Tiny fat/ fluid containing periumbilical hernia (series 3/ image 70). Small fat/fluid containing right inguinal hernia (series 3/image 96). Subcutaneous fluid/stranding along the anterior abdominal wall (series 3/ image 83). Musculoskeletal: Mild degenerative changes of the visualized thoracolumbar spine. IMPRESSION: Large volume abdominopelvic ascites. Small fat/fluid containing midline supraumbilical ventral hernia. Small fat/fluid containing right inguinal hernia. Small moderate right pleural effusion. Layering gallbladder sludge, without associated inflammatory changes. Additional ancillary findings as above. Electronically Signed   By: Julian Hy M.D.   On: 04/27/2015 15:44   X-ray Chest Pa And Lateral  05/08/2015  CLINICAL DATA:  60 year old male with vomiting today during dialysis. Hypoxia and cough. Initial encounter. EXAM:  CHEST  2 VIEW COMPARISON:  [facility] CT Abdomen and Pelvis 04/27/2015 Report of chest radiograph 02/07/2013 (no images available). FINDINGS: Low lung volumes with continued small to moderate right pleural effusion. No superimposed pneumothorax, pulmonary edema, or confluent pulmonary opacity. Stable cardiomegaly and mediastinal contours. Visualized tracheal air column is within normal limits. No acute osseous abnormality identified. Calcified aortic atherosclerosis. Gray hazy opacity throughout the abdomen compatible with the large volume ascites seen recently by CT. IMPRESSION: 1. Small to moderate right pleural effusion appears stable since 04/27/2015. 2. No new cardiopulmonary abnormality. 3. Ascites. Electronically Signed   By: Genevie Ann M.D.   On: 05/08/2015 11:41   [place] Art/ven Flow Abd Pelv Doppler  05/11/2015  CLINICAL DATA:   End-stage renal disease, dialysis dependent, cirrhosis, abdominal ascites and distension EXAM: DUPLEX ULTRASOUND OF LIVER TECHNIQUE: Color and duplex Doppler ultrasound was performed to evaluate the hepatic in-flow and out-flow vessels. COMPARISON:  04/27/2015, 05/11/2015 FINDINGS: Portal Vein Velocities Main:  35 cm/sec Right:  36 cm/sec Left:  45 cm/sec Hepatic Vein Velocities Right:  240 cm/sec Middle:  132 cm/sec Left:  37 cm/sec Hepatic Artery Velocity:  126 cm/sec Splenic Vein Velocity:  20 cm/sec Varices: Varices noted in the splenic hilum. Ascites: Large volume diffuse abdominal ascites noted. Portal vein is dilated with a 13 mm diameter. Portal, hepatic, and splenic veins are patent. Main portal vein and left portal vein remain hepatopetal. Right portal vein is hepatofugal. Splenic vein remains hepatopetal. All hepatic veins are patent and hepatofugal. No evidence of portal vein or splenic vein thrombus or occlusion. Cirrhotic changes noted of the liver.  Spleen is enlarged. IMPRESSION: Patent portal, hepatic and splenic veins without occlusion or thrombus. Evidence of portal hypertension with a dilated portal vein. Main portal vein and left portal vein remain hepatopetal however the right portal vein is hepatofugal. Large volume of ascites Splenomegaly Left upper quadrant varices noted in the splenic hilum. Electronically Signed   By: Jerilynn Mages.  Shick M.D.   On: 05/11/2015 16:06   US Paracentesis  05/20/2015  INDICATION: With end-stage renal disease on hemodialysis and a history of cirrhosis with recurrent ascites. Request is made for diagnostic and therapeutic paracentesis. EXAM: ULTRASOUND GUIDED DIAGNOSTIC AND THERAPEUTIC PARACENTESIS MEDICATIONS: 1% lidocaine COMPLICATIONS: None immediate. PROCEDURE: Informed written consent was obtained from the patient after a discussion of the risks, benefits and alternatives to treatment. A timeout was performed prior to the initiation of the procedure. Initial  ultrasound scanning demonstrates a large amount of ascites within the left lower abdominal quadrant. The left lower abdomen was prepped and draped in the usual sterile fashion. 1% lidocaine was used for local anesthesia. Following this, a 19 gauge, 7-cm, Yueh catheter was introduced. An ultrasound image was saved for documentation purposes. The paracentesis was performed. The catheter was removed and a dressing was applied. The patient tolerated the procedure well without immediate post procedural complication. FINDINGS: A total of approximately 4 L of clear yellow fluid was removed. Samples were sent to the laboratory as requested by the clinical team. IMPRESSION: Successful ultrasound-guided paracentesis yielding 4 liters of peritoneal fluid. Read by: Saverio Danker, PA-C Electronically Signed   By: Lucrezia Europe M.D.   On: 05/20/2015 10:31   US Paracentesis  05/11/2015  INDICATION: End stage renal failure on hemodialysis. Massive ascites secondary to cirrhosis. Request for diagnostic and therapeutic paracentesis up to 4 liters maximum. EXAM: ULTRASOUND GUIDED RIGHT LOWER QUADRANT PARACENTESIS MEDICATIONS: None. COMPLICATIONS: None immediate. PROCEDURE: Informed written consent was obtained from  the patient after a discussion of the risks, benefits and alternatives to treatment. A timeout was performed prior to the initiation of the procedure. Initial ultrasound scanning demonstrates a large amount of ascites within the right lower abdominal quadrant. The right lower abdomen was prepped and draped in the usual sterile fashion. 1% lidocaine with epinephrine was used for local anesthesia. Following this, a 19 gauge, 7-cm, Yueh catheter was introduced. An ultrasound image was saved for documentation purposes. The paracentesis was performed. The catheter was removed and a dressing was applied. The patient tolerated the procedure well without immediate post procedural complication. FINDINGS: A total of approximately 4  liters of clear yellow fluid was removed. Samples were sent to the laboratory as requested by the clinical team. IMPRESSION: Successful ultrasound-guided paracentesis yielding 4 liters of peritoneal fluid. Read by:  Gareth Eagle, PA-C Electronically Signed   By: Corrie Mckusick D.O.   On: 05/11/2015 14:12   Dg Chest Port 1 View  05/19/2015  CLINICAL DATA:  Pt reports SOB, rash, and abdominal distension onset today; pt receives dialysis and had treatment today; non-smoker EXAM: PORTABLE CHEST 1 VIEW COMPARISON:  05/08/2015 FINDINGS: Allowing for low lung volumes, the lungs are clear. The cardiac silhouette is normal in size. No mediastinal hilar masses or convincing adenopathy. But no convincing pleural effusion.  No pneumothorax. Bony thorax is demineralized. IMPRESSION: No acute cardiopulmonary disease. Electronically Signed   By: Lajean Manes M.D.   On: 05/19/2015 15:52    2D Echo:   Study Conclusions  - Left ventricle: The cavity size was normal. There was mild focal  basal hypertrophy of the septum. Systolic function was normal.  The estimated ejection fraction was in the range of 55% to 60%.  Wall motion was normal; there were no regional wall motion  abnormalities. Doppler parameters are consistent with abnormal  left ventricular relaxation (grade 1 diastolic dysfunction). - Aortic valve: There was mild regurgitation. Valve area (VTI): 2.1  cm^2. Valve area (Vmax): 2.33 cm^2. Valve area (Vmean): 1.96  cm^2. - Aortic root: The aortic root was mildly dilated. - Mitral valve: Calcified annulus. - Left atrium: The atrium was moderately dilated. - Atrial septum: There was an atrial septal aneurysm. - Pulmonary arteries: Systolic pressure was mildly increased.  Impressions:  - Normal LV systolic function; grade 1 diastolic dysfunction;  moderate LAE; calcified aortic valve with mild AI; trace MR; mild  TR with mildly elevated pulmonary pressure.  Admission HPI: Alexander Grant  is a 60 y.o. male w/ PMHx of HTN, ESRD on HD (TTS), Granulomatosis with Polyangiitis, h/o substance abuse, and recent admission for esophagitis/gastritis, hematemesis, and ascites. Paracentesis revealed SAAG 1.0, but doppler US of the abdomen did show a dilated portal vein c/f portal hypertension. He was discharged on Protonix. ]  He presents today due to combination of factors. First, he reports being told that if he gained 3-6 lbs, to return for repeat paracentesis. His weight has increased from 153 on discharge to 160 lbs. He went to dialysis today, where he was told he 10L extra fluid, but only 1.5L was taken off. He endorses feeling unsteady on his feet, but no confusion. He reports abdominal pain and flank/back pain consistent with his previous ascites. These pains started yesterday and are stabbing in quality. They were previously reduced following his paracentesis. He states he feels bloated and has a lot of gas.   He has also noticed new onset bilateral LE rash that started Thursday. He reports sudden eruption of all  lesions at the same time, though now they are spreading to his knees/thigh. He reports that they itch, mildly relieved with topical Hydrocortisone. He has loose stools 2/2 Miralax (for prior constipation), but denies diarrhea, melena, hematochezia, joint pain, or neck pain. He denies fever, but has had chills.  In the ED, patient was calm and breathing comfortably. He was reportedly hypotensive by EMS and given 500 mL NS.   Hospital Course by problem list:   Ascites: Patient had 4L of ascitic fluid removed that day after admission to symptomatic relief. However the patient continued to have a minimal fluid wave on exam as well as some abdominal and dyspnea related to the fluid. Studies this admission demonstrated a SAAG of 0.5. Previous imaging showed now cirrhosis or evidence of malignancy on CT Abdomen (3/31), however, cirrhosis and portal hypertension with splenic varices were  observed on RUQ US dopplers during an admission earlier this month. Hepatitis panel was negative last admission. While the patient denies an extensive alcohol use history, his daughter suggested that he may have been a heavier drinker in the past, invoking the possibility of alcoholic cirrhosis. Regardless of the etiology, it was established that it took approximately a week for his ascites to become symptomatic, and in the setting of ESRD, was not a candidate for diuretics to prevent this. Therefore, it was concluded that he would require weekly therapeutic paracenteses. Arrangements were made to have this performed as an outpatient as [facility] Pontes. He had an additional therapeutic paracentesis on day of discharge, to his relief, and the amount of fluid drained for the final paracentesis was pending on discharge.  Lower Extremity Palpable Purpura: Patient has known GPA. After speaking with nephrology, we agreed it would be unwise to start any cytotoxic agents at this time. He was started on topical and oral steroids that improved his symptoms of pruritis, but the purpura did not resolve entirely. He was discharge on a short steroid taper.   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 Patient said he might be interested in seeing a rheumatologist, which I will discuss further with him during his appointment with me next week.  Cocaine Use: UDS positive for cocaine again, despite his claims that he had not used since his last discharge. He was counseled to stop using cocaine.  Discharge Vitals:   BP 117/75 mmHg  Pulse 81  Temp(Src) 97.5 F (36.4 C) (Oral)  Resp 18  Ht 6' (1.829 m)  Wt 160 lb 15 oz (73 kg)  BMI 21.82 kg/m2  SpO2 96%  Discharge Labs:  No results found for this or any previous visit (from the past 24 hour(s)).  Signed: Liberty Handy,  MD 05/23/2015, 1:07 PM

## 2015-05-23 NOTE — Progress Notes (Signed)
[facility] Progress Note   Interval History (Pt discharged 4/15. 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 Past medical includes history of granulomatosis with polyangiitis, polysubstance abuse, non-adherence to HD. Admitted to [facility] 05/08/15-05/12/15 after witnessed episode of hematemesis at HD center. He was worked up extensively per GI and found to have severe gastritis and esophagitis per EGD with bleeding thought to be due to small Mallory-Weiss tear. DC Hgb was 10.8. During that admission, was found to have large volume ascites, low albumin and thrombocytopenia. Denied recent ETOH use. + evidence of portal hypertension and dilated portal vein per [place]. Paracentesis was done and 4 liters of fluid was removed. Hepatitis panel all negative, DC'd with instructions to F/U with GI for repeat paracentesis. Did not keep this appt and returned to ED 05/19/15 with 7 lb wt gain and new bilateral lower extremity pruritic rash with visible purpura. He had paracentesis for 4 liters 4/23. He did attend HD 05/19/15 prior to coming to hospital and stayed 2 hours 13 minutes of 4 hours treatment (This is his usual behavior). He denied fever, chills, N, V,D. Did C/O mild SOB, + DOE.    Subjective: "I think I'm getting better if I can just get this fluid off my stomach". Sitting up at bedside, denies SOB.   Objective Filed Vitals:   05/22/15 1315 05/22/15 1318 05/22/15 2020 05/23/15 0515  BP: 95/63 106/69 130/80 137/84  Pulse: 79 86 77 81  Temp: 97 F (36.1 C)  97.5 F (36.4 C) 97.5 F (36.4 C)  TempSrc:   Oral Axillary  Resp: 19  17 18   Height:      Weight: 73 kg (160 lb 15 oz)     SpO2:   95% 95%   Physical Exam General: Chronically ill appearing male in NAD Heart: S1, S2, RRR  Lungs: Bilateral breath sounds, sl dec RLL CTA A/P no WOB Abdomen: soft, persistent ascites-looks increased today. Non-tender  active BS Extremities: No LE edema. Skin: purpuric rash bilateral LE. Appears less reddened today.  Dialysis Access: LUA AVF + bruit   Additional Objective Labs: Basic Metabolic Panel:  Recent Labs Lab 05/20/15 0410 05/21/15 0539 05/21/15 1304 05/22/15 0556  NA 134* 131*  --  130*  K 5.2* 5.6* 4.9 5.3*  CL 94* 91*  --  87*  CO2 25 23  --  24  GLUCOSE 86 104*  --  145*  BUN 80* 96*  --  107*  CREATININE 10.36* 11.57*  --  12.66*  CALCIUM 9.1 8.6*  --  8.2*  PHOS  --  6.8*  --  7.8*   Liver Function Tests:  Recent Labs Lab 05/19/15 1439 05/20/15 0410 05/21/15 0539 05/22/15 0556  AST 31 28  --   --   ALT 14* 13*  --   --   ALKPHOS 87 94  --   --   BILITOT 0.7 0.7  --   --   PROT 6.0* 5.8*  --   --   ALBUMIN 1.8* 1.7* 1.8* 1.9*    Recent Labs Lab 05/19/15 1439  LIPASE 51   CBC:  Recent Labs Lab 05/19/15 1439 05/20/15 0410 05/22/15 0556  WBC 3.2* 3.2* 6.0  HGB 9.9* 10.1* 11.8*  HCT 31.4* 32.7* 36.9*  MCV 92.1 91.1 89.3  PLT 133* 141* 171   Blood Culture    Component Value Date/Time   SDES FLUID ASCITIC 05/20/2015 0930  SDES FLUID ASCITIC 05/20/2015 0930   SPECREQUEST NONE 05/20/2015 0930   SPECREQUEST NONE 05/20/2015 0930   CULT NO GROWTH 2 DAYS 05/20/2015 0930   REPTSTATUS PENDING 05/20/2015 0930   REPTSTATUS 05/20/2015 FINAL 05/20/2015 0930    Medications:   . Chlorhexidine Gluconate Cloth  6 each Topical Q0600  . cinacalcet  60 mg Oral Q supper  . feeding supplement (PRO-STAT SUGAR FREE 64)  30 mL Oral BID  . mupirocin ointment  1 application Nasal BID  . pantoprazole  40 mg Oral BID  . predniSONE  40 mg Oral Q breakfast  . senna-docusate  1 tablet Oral BID  . sevelamer carbonate  2,400 mg Oral TID WC  . sodium chloride flush  3 mL Intravenous Q12H  . triamcinolone cream   Topical QID   Dialysis Orders: Dialysis Orders: [place] TueThuSat, 4 hrs 0 min, 180NRe Optiflux,  BFR 350, DFR Manual 800 mL/min,  EDW 72 (kg), Dialysate 2.0 K,  2.25 Ca,  UFR Profile: Profile 2,  Access: AV Fistula-Standard Heparin: NONE Aranesp: 200 mcg IV q week ( last dose 05/15/15).   Assessment/Plan: 1. Portal Hypertension/Ascites: Hep serologies all neg. Last paracentesis 05/20/15. Aspirated 4 liters. OP paracentesis been scheduled per primary. Probable repeat paracentesis today. CT of pelvis done 04/17/15 revealed large volume  abdominopelvic ascites, 1.7 cm cyst in the left hepatic dome unchanged from previous exam.  2. ESRD -TTS @ [facility]. H/O missed/shortened txs. HD 05/22/15 stayed for treatment. Next HD 05/24/15 on schedule. K+5.3 3. Anemia - HGB up to 11.8. Last dose of ESA 05/15/15. Follow for now.  4. Secondary hyperparathyroidism - Last in-center PTH 242 (04/03/15). Cont cinacalcet, binders.  5. HTN/volume - HD 05/22/15 Pre wt 73.4 kg Net UF 400 Post wt 73 kg. Had issues with hypotension. OP EDW 72 kg. May need to increase.  6. Nutrition - Severely protein malnutrition. Albumin 1.8. Renal diet, prostat, renal vit 7. Polysubstance Abuse: UDA positive for cocaine.  8. Purpuric rash LE. Palpable purpura. Probably vasculitic. H/O GPA. On topical steroids as well as prednisone 40. He feels rash is improving (looks about the same to me as it did yesterday) Rash does not appear as reddened as previous assessment.   Rita H. Brown NP-C 05/23/2015, 9:27 AM  [employer] [phone]  I have seen and examined this patient and agree with plan and assessment in the above note. Had 6 liter paracentesis today, and per teaching service has been scheduled for regular taps on a weekly basis.  Prednisone continued for vasculitic LE rash (not biopsied). For discharge to home - not sure who he is to followup with for his primary care issues and who will be scheduling his f/u paracentesis. Will check with the teaching service.  Hamna Asa B,MD 05/23/2015 3:47 PM

## 2015-05-23 NOTE — Discharge Instructions (Signed)
Alexander Grant,  It was a pleasure taking care of you in the hospital.  For your fluid buildup in your abdomen, you will need drainings once a week. If you do not follow up on this you will end up back in the hospital. I have scheduled you an appointment at 12:45 PM on Monday 05/28/2015 at this Hospital Radiology Department at [facility] to have this done. It is absolutely necessary that you make this appointment.   You will follow up with me, Dr. Marijean Bravo, in clinic next Wednesday 05/30/15 to follow up on your fluid and your rash. We may make a referral to a rheumatologist, Dr. Amil Amen. I will also schedule you an outpatient CT scan at that time.  For your rash, please take the prednisone 20 mg two tablets daily for three days, then take 1 tablet daily for two days.  Go to all of your dialysis sessions!

## 2015-05-23 NOTE — Progress Notes (Signed)
Patient refusing bed alarm this evening shift.  Re-educated patient on importance of utilizing bed alarm; patient still refused.  Non-skid socks refused.  Bed in lowest position.  Emphasized use of call bell if needed to use the bathroom; verbalized understanding.  Will continue to monitor patient. 

## 2015-05-23 NOTE — Procedures (Signed)
Para Amount pending No comp/EBL

## 2015-05-24 LAB — CULTURE, BLOOD (ROUTINE X 2)
CULTURE: NO GROWTH
Culture: NO GROWTH

## 2015-05-25 ENCOUNTER — Other Ambulatory Visit: Payer: Self-pay | Admitting: Internal Medicine

## 2015-05-25 DIAGNOSIS — R188 Other ascites: Secondary | ICD-10-CM

## 2015-05-25 LAB — CULTURE, BODY FLUID-BOTTLE: CULTURE: NO GROWTH

## 2015-05-25 LAB — CULTURE, BODY FLUID W GRAM STAIN -BOTTLE

## 2015-05-28 ENCOUNTER — Ambulatory Visit (HOSPITAL_COMMUNITY): Payer: Medicaid Other

## 2015-05-28 ENCOUNTER — Ambulatory Visit (HOSPITAL_COMMUNITY)
Admission: RE | Admit: 2015-05-28 | Discharge: 2015-05-28 | Disposition: A | Discharge status: Home / Self Care | Payer: Medicaid Other | Source: Ambulatory Visit | Attending: Internal Medicine | Admitting: Internal Medicine

## 2015-05-28 DIAGNOSIS — R188 Other ascites: Secondary | ICD-10-CM | POA: Diagnosis not present

## 2015-05-28 MED ORDER — LIDOCAINE HCL (PF) 1 % IJ SOLN
INTRAMUSCULAR | Status: AC
  Filled 2015-05-28: qty 10

## 2015-05-28 NOTE — Procedures (Signed)
Ultrasound-guided therapeutic paracentesis performed yielding 9.4 liters of clear yellow colored fluid. No immediate complications.  Kingsley Herandez E 3:14 PM 05/28/2015

## 2015-05-30 ENCOUNTER — Encounter: Payer: Self-pay | Admitting: Internal Medicine

## 2015-05-30 ENCOUNTER — Ambulatory Visit (INDEPENDENT_AMBULATORY_CARE_PROVIDER_SITE_OTHER): Payer: Medicaid Other | Admitting: Gastroenterology

## 2015-05-30 ENCOUNTER — Ambulatory Visit (INDEPENDENT_AMBULATORY_CARE_PROVIDER_SITE_OTHER): Payer: Medicaid Other | Admitting: Internal Medicine

## 2015-05-30 ENCOUNTER — Other Ambulatory Visit: Payer: Medicaid Other

## 2015-05-30 ENCOUNTER — Encounter: Payer: Self-pay | Admitting: Gastroenterology

## 2015-05-30 VITALS — BP 110/60 | HR 68 | Ht 74.0 in | Wt 160.0 lb

## 2015-05-30 VITALS — BP 146/88 | HR 86 | Temp 98.0°F | Ht 74.0 in | Wt 160.5 lb

## 2015-05-30 DIAGNOSIS — D692 Other nonthrombocytopenic purpura: Secondary | ICD-10-CM | POA: Diagnosis not present

## 2015-05-30 DIAGNOSIS — R188 Other ascites: Secondary | ICD-10-CM | POA: Diagnosis not present

## 2015-05-30 DIAGNOSIS — Z992 Dependence on renal dialysis: Secondary | ICD-10-CM

## 2015-05-30 DIAGNOSIS — K317 Polyp of stomach and duodenum: Secondary | ICD-10-CM | POA: Diagnosis not present

## 2015-05-30 DIAGNOSIS — M545 Low back pain: Secondary | ICD-10-CM | POA: Diagnosis not present

## 2015-05-30 DIAGNOSIS — Z860100 Personal history of colon polyps, unspecified: Secondary | ICD-10-CM

## 2015-05-30 DIAGNOSIS — K209 Esophagitis, unspecified without bleeding: Secondary | ICD-10-CM

## 2015-05-30 DIAGNOSIS — M3131 Wegener's granulomatosis with renal involvement: Secondary | ICD-10-CM | POA: Diagnosis not present

## 2015-05-30 DIAGNOSIS — N186 End stage renal disease: Secondary | ICD-10-CM

## 2015-05-30 DIAGNOSIS — A150 Tuberculosis of lung: Secondary | ICD-10-CM

## 2015-05-30 MED ORDER — HYDROCODONE-ACETAMINOPHEN 5-325 MG PO TABS: 1.0000 | ORAL_TABLET | Freq: Four times a day (QID) | ORAL | Status: AC | PRN

## 2015-05-30 NOTE — Progress Notes (Signed)
   Subjective:    Patient ID: Alexander Grant., male    DOB: [birth year], 60 y.o.   MRN: [id number]  HPI  [name] is a 60 year old gentleman with a PMH as below who comes to the clinic to discuss his ascites, vasculitis, lower back pain, and lower extremity rash. With respect to his ascites, he feel much better after nearly 10 L of fluid was removed on 5/1. He has not felt any appreciable fluid collection since then. He says he would feel comfortable calling the clinic again for a referral for a repeat paracentesis if necessary. He says the lower extremity palpable purpura since the last admission has resolved with steroids. He is interested in going to a rheumatologist for his vasculitis, and he has an appointment with Dr. Amil Amen on May 24th. He says he goes to all of his HD sessions. He also describes severe back pain that he's felt for a month. He says it is in his lower back and beside his spine. It is exacerbated by sitting for a long time. He wonders if his symptoms are due carrying fluid on his stomach. He says tramadol has been unhelpful and he does not wish to take much tylenol or ibuprofen given his possible liver failure and ESRD. He denies any cocaine use since discharge.    Active Ambulatory Problems    Diagnosis Date Noted  . Cocaine use 09/03/2011  . Wegener's granulomatosis with renal involvement ([place])   . Ascites   . Back pain 05/19/2015  . Palpable purpura ([place]) 05/19/2015  . End stage renal disease on dialysis ([place])   . Hypoalbuminemia   . Thrombocytopenia ([place])      Review of Systems  Constitutional: Negative for fever and fatigue.  HENT: Negative for congestion and sore throat.   Eyes: Negative for redness and itching.  Respiratory: Negative for cough, shortness of breath and wheezing.   Cardiovascular: Negative for chest pain, palpitations and leg swelling.  Gastrointestinal: Negative for abdominal pain, diarrhea, constipation and abdominal distention.    Endocrine: Negative for cold intolerance and heat intolerance.  Genitourinary: Negative for hematuria and decreased urine volume.       Still makes urine.   Musculoskeletal: Positive for back pain and arthralgias.  Neurological: Negative for dizziness and headaches.  Psychiatric/Behavioral: Negative for dysphoric mood. The patient is not nervous/anxious.        Objective:   Physical Exam  Constitutional: Cachectic, NAD Head: Normocephalic and atraumatic.  Eyes: EOM are normal. No scleral icterus.  Neck: No JVD present. No tracheal deviation present.  Cardiovascular: Normal rate, regular rhythm, normal heart sounds and intact distal pulses.  Pulmonary/Chest: No stridor.  CTAB, unlabored breathing Abdominal: Soft.  Abdomen nondistended and nontender palpation. No fluid wave appreciated. Normal bowel sounds Musculoskeletal: He exhibits no edema.  Neurological: He is alert and oriented to person, place, and time.  Skin: Skin is warm and dry. He is not diaphoretic.  No palpable purpura on bilateral LE. Nontender. Healing scabs.      Assessment & Plan:   Please see problem based assessment and plan for details.

## 2015-05-30 NOTE — Patient Instructions (Signed)
[facility] will contact you to schedule your Liver biopsy  Go to the basement for labs today

## 2015-05-30 NOTE — Progress Notes (Signed)
HPI :  60 y.o. male with PMH of HTN, medical non-compliance, ESRD on HD due to Wegener's granulomatosis, history of substance abuse, history of kideny stones, and history of Fournier's gangrene with abscess drainage, here for a follow up visit.   I initially met him when consulted while inpatient last month when he presented with a few episodes of hematemesis. During this admission he also complained of new onset significant abdominal distention from ascites, as seen on recent CT scan. EGD was done while inpatient 4/12 showed the following: - 6 cm hiatal hernia. - Severe reflux esophagitis. Brushings ruled out Candidiasis. - Suspected enlarged gastric fold vs. gastric polyp. 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 - Normal duodenal bulb. - A single duodenal polyp. Benign polyp  He was treated with PPI and had no recurrence of symptoms. It was thought that his hematemesis was due to severe esophagitis. Otherwise the cause of his ascites is not clear at this time. Abdominal US with doppler done which showed large volume of ascites, splenomegaly, and left upper quadrant varices noted in the splenic hilum. No evidence of portal vein thrombosis was noted, however the portal vein was dilated concerning for portal hypertension. He has had 3 paracentesis since his hospitalization, total of 21L of fluid removed.. Labs for most recent paracentesis on 4/23 showed the following: t prot 3.1, alb 1.2, LDH 10, glucose 91, WBC 278, 10% N, culture and gram stain negative. SAAG of 0.5.  Echocardiogram in April showed,  Normal LV systolic function; grade 1 diastolic dysfunction; moderate LAE; calcified aortic valve with mild AI; trace MR; mild TR with mildly elevated pulmonary pressure.  He is taking pantoprazole 60m once daily. He is taking zofran twice daily. He has not had any further vomiting or hematemesis. His last paracentesis was 9.5  liters on Monday. He has been very distended prior to this but endorses feeling significant improvement since the paracentesis. He denies any history of liver disease. No alcohol use at all. NO FH of liver disease. No TB exposures. He makes minimal urine, and is on dialysis, he has been on dialysis for 1.5 years. He has been on a high protein diet now to improve his nutritional status. It has been taking a few weeks of abdominal distension to recur. Aside of ascites he has been feeling pretty well without difficulty. He denies NSAID use.   Prior colonoscopy 08/2011 by Dr. PHilarie Fredricksonwith several polyps removed (tubular adenomas and eroded inflammatory polyps) but prep was poor and it was recommended that he have a repeat colonoscopy in 6 months from that time, which he did not have performed.   Past Medical History  Diagnosis Date  . Fournier gangrene     with peri-rectal/perineal abscess drainage  . PONV (postoperative nausea and vomiting)   . [place]'s granulomatosis with [place]   . Anemia   . History of blood transfusion 08/2011  . Lower GI bleeding 10/01/2011  . Cocaine abuse 2013  . Tubular adenoma of colon   . Hyponatremia   . Acute renal failure ([place]   . Pulmonary infiltrate   . Reflux esophagitis   . Diverticulosis   . Right thyroid nodule   . Cardiomegaly   . Renal disorder     Dialysis  . Kidney stone   . Hypertension   . Upper GI bleed 04/2015     Past Surgical History  Procedure Laterality Date  . Esophagogastroduodenoscopy  09/10/2011  Procedure: ESOPHAGOGASTRODUODENOSCOPY (EGD);  Surgeon: Jerene Bears, MD;  Location: [facility];  Service: Gastroenterology;  Laterality: N/A;  . Colonoscopy  09/11/2011    Procedure: COLONOSCOPY;  Surgeon: Jerene Bears, MD;  Location: [facility];  Service: Gastroenterology;  Laterality: N/A;  . Knee arthroscopy w/ medial collateral ligament (mcl) repair  ~ 2000    left  . Knee arthroscopy w/ acl reconstruction  2002  .  Shoulder open rotator cuff repair   09/1999    right  . Incision and drainage perirectal abscess  05/2008    'for Fournier's gangrene"  . Kidney stone surgery  1990  . Vasectomy    . Fracture surgery Right     wrist and great toe  . Av fistula placement Left 02/07/2013    Procedure: ARTERIOVENOUS (AV) FISTULA CREATION- LEFT BRACHIOCEPHALIC ;  Surgeon: Rosetta Posner, MD;  Location: [facility];  Service: Vascular;  Laterality: Left;  . Insertion of dialysis catheter N/A 02/07/2013    Procedure: INSERTION OF DIALYSIS CATHETER;  Surgeon: Rosetta Posner, MD;  Location: [facility];  Service: Vascular;  Laterality: N/A;  . Anterior cruciate ligament repair    . Shoulder surgery Left   . Esophagogastroduodenoscopy N/A 05/09/2015    Procedure: ESOPHAGOGASTRODUODENOSCOPY (EGD);  Surgeon: Manus Gunning, MD;  Location: [facility];  Service: Gastroenterology;  Laterality: N/A;   Family History  Problem Relation Age of Onset  . Stroke Mother   . Cancer Father    Social History  Substance Use Topics  . Smoking status: Never Smoker   . Smokeless tobacco: Never Used  . Alcohol Use: No     Comment: 10/01/2011 "last alcohol ~ 2012"   Current Outpatient Prescriptions  Medication Sig Dispense Refill  . Amino Acids-Protein Hydrolys (FEEDING SUPPLEMENT, PRO-STAT SUGAR FREE 64,) LIQD Take 30 mLs by mouth 2 (two) times daily. 900 mL 0  . cinacalcet (SENSIPAR) 30 MG tablet Take 60 mg by mouth every other day.     . Darbepoetin Alfa (ARANESP) 200 MCG/0.4ML SOSY injection Inject 0.4 mLs (200 mcg total) into the vein every Tuesday with hemodialysis. 1.68 mL 5  . hydrocortisone cream 1 % Apply topically 4 (four) times daily. 30 g 0  . multivitamin (RENA-VIT) TABS tablet Take 1 tablet by mouth at bedtime. 60 tablet 5  . Nutritional Supplements (FEEDING SUPPLEMENT, NEPRO CARB STEADY,) LIQD Take 237 mLs by mouth 3 (three) times daily between meals. 48 Can 5  . ondansetron (ZOFRAN) 4 MG tablet Take 1 tablet (4 mg total)  by mouth every 6 (six) hours as needed for nausea. 20 tablet 0  . pantoprazole (PROTONIX) 40 MG tablet Take 1 tablet (40 mg total) by mouth 2 (two) times daily. 60 tablet 3  . polyethylene glycol (MIRALAX / GLYCOLAX) packet Take 17 g by mouth daily. (Patient taking differently: Take 17 g by mouth daily as needed for moderate constipation. ) 14 each 0  . predniSONE (DELTASONE) 20 MG tablet Take 2 tablets daily for 3 days. Then take 1 tablet daily for 2 days. 8 tablet 0  . sevelamer carbonate (RENVELA) 800 MG tablet Take 3 tablets (2,400 mg total) by mouth 3 (three) times daily with meals. (Patient taking differently: Take 800-2,400 mg by mouth 3 (three) times daily with meals. Takes 3 tabs with each meal and 1 tab with each snack) 90 tablet 3  . HYDROcodone-acetaminophen (NORCO/VICODIN) 5-325 MG tablet Take 1 tablet by mouth every 6 (six) hours as needed for moderate pain. [place]  tablet 0   No current facility-administered medications for this visit.   Allergies  Allergen Reactions  . Bee Pollen Anaphylaxis, Shortness Of Breath and Rash  . Bee Venom Anaphylaxis, Shortness Of Breath and Rash  . Percocet [Oxycodone-Acetaminophen] Hives and Rash     Review of Systems: All systems reviewed and negative except where noted in HPI.   Lab Results  Component Value Date   ALT 13* 05/20/2015   AST 28 05/20/2015   ALKPHOS 94 05/20/2015   BILITOT 0.7 05/20/2015    Lab Results  Component Value Date   WBC 6.0 05/22/2015   HGB 11.8* 05/22/2015   HCT 36.9* 05/22/2015   MCV 89.3 05/22/2015   PLT 171 05/22/2015    Lab Results  Component Value Date   CREATININE 12.66* 05/22/2015   BUN 107* 05/22/2015   NA 130* 05/22/2015   K 5.3* 05/22/2015   CL 87* 05/22/2015   CO2 24 05/22/2015   Lab Results  Component Value Date   INR 1.21 05/19/2015   INR 1.25 05/08/2015   INR 1.15 10/02/2011      X-ray Chest Pa And Lateral  05/08/2015  CLINICAL DATA:  60 year old male with vomiting today during  dialysis. Hypoxia and cough. Initial encounter. EXAM: CHEST  2 VIEW COMPARISON:  [facility] CT Abdomen and Pelvis 04/27/2015 Report of chest radiograph 02/07/2013 (no images available). FINDINGS: Low lung volumes with continued small to moderate right pleural effusion. No superimposed pneumothorax, pulmonary edema, or confluent pulmonary opacity. Stable cardiomegaly and mediastinal contours. Visualized tracheal air column is within normal limits. No acute osseous abnormality identified. Calcified aortic atherosclerosis. Gray hazy opacity throughout the abdomen compatible with the large volume ascites seen recently by CT. IMPRESSION: 1. Small to moderate right pleural effusion appears stable since 04/27/2015. 2. No new cardiopulmonary abnormality. 3. Ascites. Electronically Signed   By: Genevie Ann M.D.   On: 05/08/2015 11:41   [place] Art/ven Flow Abd Pelv Doppler  05/11/2015  CLINICAL DATA:  End-stage renal disease, dialysis dependent, cirrhosis, abdominal ascites and distension EXAM: DUPLEX ULTRASOUND OF LIVER TECHNIQUE: Color and duplex Doppler ultrasound was performed to evaluate the hepatic in-flow and out-flow vessels. COMPARISON:  04/27/2015, 05/11/2015 FINDINGS: Portal Vein Velocities Main:  35 cm/sec Right:  36 cm/sec Left:  45 cm/sec Hepatic Vein Velocities Right:  240 cm/sec Middle:  132 cm/sec Left:  37 cm/sec Hepatic Artery Velocity:  126 cm/sec Splenic Vein Velocity:  20 cm/sec Varices: Varices noted in the splenic hilum. Ascites: Large volume diffuse abdominal ascites noted. Portal vein is dilated with a 13 mm diameter. Portal, hepatic, and splenic veins are patent. Main portal vein and left portal vein remain hepatopetal. Right portal vein is hepatofugal. Splenic vein remains hepatopetal. All hepatic veins are patent and hepatofugal. No evidence of portal vein or splenic vein thrombus or occlusion. Cirrhotic changes noted of the liver.  Spleen is enlarged. IMPRESSION: Patent portal, hepatic and  splenic veins without occlusion or thrombus. Evidence of portal hypertension with a dilated portal vein. Main portal vein and left portal vein remain hepatopetal however the right portal vein is hepatofugal. Large volume of ascites Splenomegaly Left upper quadrant varices noted in the splenic hilum. Electronically Signed   By: Jerilynn Mages.  Shick M.D.   On: 05/11/2015 16:06   US Paracentesis  05/28/2015  INDICATION: End-stage renal disease on hemodialysis and a history of cirrhosis with recurrent ascites. Request is made today for a therapeutic paracentesis. EXAM: ULTRASOUND GUIDED THERAPEUTIC PARACENTESIS MEDICATIONS: 1% lidocaine COMPLICATIONS: None immediate.  PROCEDURE: Informed written consent was obtained from the patient after a discussion of the risks, benefits and alternatives to treatment. A timeout was performed prior to the initiation of the procedure. Initial ultrasound scanning demonstrates a large amount of ascites within the right lower abdominal quadrant. The right lower abdomen was prepped and draped in the usual sterile fashion. 1% lidocaine was used for local anesthesia. Following this, a 19 gauge, 7-cm, Yueh catheter was introduced. An ultrasound image was saved for documentation purposes. The paracentesis was performed. The catheter was removed and a dressing was applied. The patient tolerated the procedure well without immediate post procedural complication. FINDINGS: A total of approximately 9.4 L of clear yellow fluid was removed. IMPRESSION: Successful ultrasound-guided paracentesis yielding 9.4 liters of peritoneal fluid. Read by: Saverio Danker, PA-C Electronically Signed   By: Corrie Mckusick D.O.   On: 05/28/2015 15:17   US Paracentesis  05/23/2015  INDICATION: Liver disease an ascites. EXAM: ULTRASOUND GUIDED RIGHT LOWER QUADRANT PARACENTESIS MEDICATIONS: None. COMPLICATIONS: None immediate. PROCEDURE: Informed written consent was obtained from the patient after a discussion of the risks,  benefits and alternatives to treatment. A timeout was performed prior to the initiation of the procedure. Initial ultrasound scanning demonstrates a large amount of ascites within the right lower abdominal quadrant. The right lower abdomen was prepped and draped in the usual sterile fashion. 1% lidocaine with epinephrine was used for local anesthesia. Following this, a Safe-T-Centesis catheter was introduced. An ultrasound image was saved for documentation purposes. The paracentesis was performed. The catheter was removed and a dressing was applied. The patient tolerated the procedure well without immediate post procedural complication. FINDINGS: A total of approximately 6 L of clear fluid was removed. IMPRESSION: Successful ultrasound-guided paracentesis yielding 6 liters of peritoneal fluid. Electronically Signed   By: Marybelle Killings M.D.   On: 05/23/2015 13:48   US Paracentesis  05/20/2015  INDICATION: With end-stage renal disease on hemodialysis and a history of cirrhosis with recurrent ascites. Request is made for diagnostic and therapeutic paracentesis. EXAM: ULTRASOUND GUIDED DIAGNOSTIC AND THERAPEUTIC PARACENTESIS MEDICATIONS: 1% lidocaine COMPLICATIONS: None immediate. PROCEDURE: Informed written consent was obtained from the patient after a discussion of the risks, benefits and alternatives to treatment. A timeout was performed prior to the initiation of the procedure. Initial ultrasound scanning demonstrates a large amount of ascites within the left lower abdominal quadrant. The left lower abdomen was prepped and draped in the usual sterile fashion. 1% lidocaine was used for local anesthesia. Following this, a 19 gauge, 7-cm, Yueh catheter was introduced. An ultrasound image was saved for documentation purposes. The paracentesis was performed. The catheter was removed and a dressing was applied. The patient tolerated the procedure well without immediate post procedural complication. FINDINGS: A total of  approximately 4 L of clear yellow fluid was removed. Samples were sent to the laboratory as requested by the clinical team. IMPRESSION: Successful ultrasound-guided paracentesis yielding 4 liters of peritoneal fluid. Read by: Saverio Danker, PA-C Electronically Signed   By: Lucrezia Europe M.D.   On: 05/20/2015 10:31   US Paracentesis  05/11/2015  INDICATION: End stage renal failure on hemodialysis. Massive ascites secondary to cirrhosis. Request for diagnostic and therapeutic paracentesis up to 4 liters maximum. EXAM: ULTRASOUND GUIDED RIGHT LOWER QUADRANT PARACENTESIS MEDICATIONS: None. COMPLICATIONS: None immediate. PROCEDURE: Informed written consent was obtained from the patient after a discussion of the risks, benefits and alternatives to treatment. A timeout was performed prior to the initiation of the procedure. Initial ultrasound scanning  demonstrates a large amount of ascites within the right lower abdominal quadrant. The right lower abdomen was prepped and draped in the usual sterile fashion. 1% lidocaine with epinephrine was used for local anesthesia. Following this, a 19 gauge, 7-cm, Yueh catheter was introduced. An ultrasound image was saved for documentation purposes. The paracentesis was performed. The catheter was removed and a dressing was applied. The patient tolerated the procedure well without immediate post procedural complication. FINDINGS: A total of approximately 4 liters of clear yellow fluid was removed. Samples were sent to the laboratory as requested by the clinical team. IMPRESSION: Successful ultrasound-guided paracentesis yielding 4 liters of peritoneal fluid. Read by:  Gareth Eagle, PA-C Electronically Signed   By: Corrie Mckusick D.O.   On: 05/11/2015 14:12   Dg Chest Port 1 View  05/19/2015  CLINICAL DATA:  Pt reports SOB, rash, and abdominal distension onset today; pt receives dialysis and had treatment today; non-smoker EXAM: PORTABLE CHEST 1 VIEW COMPARISON:  05/08/2015 FINDINGS:  Allowing for low lung volumes, the lungs are clear. The cardiac silhouette is normal in size. No mediastinal hilar masses or convincing adenopathy. But no convincing pleural effusion.  No pneumothorax. Bony thorax is demineralized. IMPRESSION: No acute cardiopulmonary disease. Electronically Signed   By: Lajean Manes M.D.   On: 05/19/2015 15:52    Physical Exam: BP 110/60 mmHg  Pulse 68  Ht _0  (1.88 m)  Wt 160 lb (72.576 kg)  BMI 20.53 kg/m2 Constitutional: Pleasant,well-developed, male in no acute distress. HEENT: Normocephalic and atraumatic. Conjunctivae are normal. No scleral icterus. Neck supple.  Cardiovascular: Normal rate, regular rhythm. 2/6 SEM Pulmonary/chest: Effort normal and breath sounds normal. No wheezing, rales or rhonchi. Abdominal: Soft, nondistended, small fluid wave, nontender. Bowel sounds active throughout. There are no masses palpable. No hepatomegaly. Extremities: (+) 2 edema, LE B Lymphadenopathy: No cervical adenopathy noted. Neurological: Alert and oriented to person place and time. Skin: Skin is warm and dry. Psychiatric: Normal mood and affect. Behavior is normal.   ASSESSMENT AND PLAN: 60 y/o male with ESRD from Wegener's on HD, who presented last month with an upper GI bleed due to severe esophagitis, also incidentally found to have other abnormalities on EGD, and noted to have massive ascites of unclear etiology at this time. Issues as addressed as below:  Ascites - etiology for this is not clear at this time, labs and imaging are discordant. His SAAG is 0.5 and argues against portal hypertension, however imaging of the liver and spleen argues for portal hypertension. Cytology is negative for malignancy. Echocardiogram did not show significant dysfunction. Nephrotic syndrome is possible but he doesn't make much urine and urinalysis did not show significant proteinuria. There is no evidence of portal vein thrombosis or Budd chiari. He has no TB exposures  but will send quantiferon gold to assess for peritoneal tuberculosis. His platelets are mildly low and with his imaging, despite SAAG result and normal LFTs, cirrhosis / portal hypertension remains on the differential. Unfortunately without any significant urine output he cannot diurese the fluid and it continues to reaccumulate leading to repeated large volume paracentesis. Given the etiology is not clear, I offered him a transjugular liver biopsy with portal pressures to clarify if he has portal hypertension or cirrhosis. If he did have portal hypertension / cirrhosis, perhaps he could be a candidate for TIPS. I discussed the procedure with him, risks / benefits, and he wished to proceed. Consult placed to IR. I will let him know biopsy results once  available. Of note, viral hepatitis testing is negative for B and C. Prior iron panel okay. Will await results of liver biopsy prior to further serologic workup for liver diseases. He agreed. Likely will need repeat paracentesis in a few weeks if fluid re accumulates quickly.   Esophagitis - no recurrence of symptoms. 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 GAVE could be related to underlying portal hypertension if present. Will await liver biopsy results, reassess this issue in the future.   Colon polyps - he is overdue for a surveillance colonoscopy. Again once his acute issues have been addressed we will reassess this issue. He agreed.   McLeod Cellar, MD [facility] Pager [phone]  CC: Corliss Parish, MD

## 2015-05-30 NOTE — Patient Instructions (Signed)
Alexander Grant,  It was a pleasure seeing you again.  For your lower back pain, we will do a two week course of hydrocodone-acetaminophen 5-325. DO NOT take Tylenol with this. I think this pain will resolve on its own, but if it doesn't, the hydrocodone will not be a good solution long-term. We will not prescribe the hydrocodone any further because it will not likely be effective in the long-term. The next step is physical therapy if this back pain does not go away.  For your ascites (fluid build-up), please call the clinic once you notice your fluid starts to build up and we can arrange for a paracentesis. Please follow up with Dr. Duanne Guess for your biopsy next week. Please follow up with your new rheumatologist on May 24th  PLEASE ATTEND ALL OF [place].  We will see you in a month.

## 2015-05-30 NOTE — Assessment & Plan Note (Signed)
A: Patient interested in exploring treatment options  P: Appt with rheum 5/24

## 2015-05-30 NOTE — Assessment & Plan Note (Signed)
A: Back pain appears to be related to the ascites, and therefore, should be a pain limited in duration. We will treat this musculoskeletal pain for 2 weeks with hydrocodone and reevaluate. If his symptoms persist, a PT referral would be in order  P: Hydrocodone 5-325 #30 pills

## 2015-05-30 NOTE — Assessment & Plan Note (Signed)
A: Etiology is unclear at this time. Dr. Duanne Guess from GI saw him today, who made the recommendation for a transjugular liver biopsy to query that he has portal hypertension and cirrhosis. If this is confirmed, he may be a candidate for a TIPS procedure. In the mean time, he will likely re-accumulate fluid and will need intermittent therapeutic paracenteses. I instructed him that if he should notice increasing fluid in his abdomen, he should call the clinic and I will refer him for a paracentesis ASAP. He agreed with this plan. GI is also investigating other sources of his ascites, such as TB.  P: TJ Liver biopsy Tx Paracenteses as above.

## 2015-05-30 NOTE — Assessment & Plan Note (Signed)
A: Due to GPA. Symptoms entirely resolved with steroids.  P: Address GPA as above Steroid cream

## 2015-05-31 ENCOUNTER — Other Ambulatory Visit: Payer: Self-pay | Admitting: Internal Medicine

## 2015-05-31 DIAGNOSIS — R188 Other ascites: Secondary | ICD-10-CM

## 2015-05-31 NOTE — Progress Notes (Signed)
Internal Medicine Clinic Attending  Case discussed with Dr. Ford at the time of the visit.  We reviewed the resident's history and exam and pertinent patient test results.  I agree with the assessment, diagnosis, and plan of care documented in the resident's note.  

## 2015-06-01 LAB — QUANTIFERON TB GOLD ASSAY (BLOOD)
MITOGEN-NIL SO: 0.23 [IU]/mL
QUANTIFERON NIL VALUE: 0.06 [IU]/mL
QUANTIFERON TB AG MINUS NIL: 0.02 [IU]/mL

## 2015-06-02 ENCOUNTER — Other Ambulatory Visit (HOSPITAL_COMMUNITY): Payer: Self-pay

## 2015-06-04 ENCOUNTER — Emergency Department (HOSPITAL_COMMUNITY): Payer: Medicaid Other

## 2015-06-04 ENCOUNTER — Inpatient Hospital Stay (HOSPITAL_COMMUNITY)
Admission: EM | Admit: 2015-06-04 | Discharge: 2015-06-06 | DRG: 602 | Disposition: A | Discharge status: Home / Self Care | Payer: Medicaid Other | Attending: Oncology | Admitting: Oncology

## 2015-06-04 ENCOUNTER — Ambulatory Visit (HOSPITAL_COMMUNITY)
Admission: RE | Admit: 2015-06-04 | Discharge: 2015-06-04 | Disposition: A | Discharge status: Home / Self Care | Payer: Medicaid Other | Source: Ambulatory Visit | Attending: Internal Medicine | Admitting: Internal Medicine

## 2015-06-04 ENCOUNTER — Other Ambulatory Visit: Payer: Self-pay

## 2015-06-04 ENCOUNTER — Encounter (HOSPITAL_COMMUNITY): Payer: Self-pay | Admitting: Emergency Medicine

## 2015-06-04 DIAGNOSIS — K7469 Other cirrhosis of liver: Secondary | ICD-10-CM | POA: Diagnosis present

## 2015-06-04 DIAGNOSIS — K209 Esophagitis, unspecified: Secondary | ICD-10-CM | POA: Diagnosis present

## 2015-06-04 DIAGNOSIS — R188 Other ascites: Secondary | ICD-10-CM

## 2015-06-04 DIAGNOSIS — E875 Hyperkalemia: Secondary | ICD-10-CM | POA: Diagnosis present

## 2015-06-04 DIAGNOSIS — E872 Acidosis: Secondary | ICD-10-CM | POA: Diagnosis present

## 2015-06-04 DIAGNOSIS — L03312 Cellulitis of back [any part except buttock]: Secondary | ICD-10-CM | POA: Diagnosis present

## 2015-06-04 DIAGNOSIS — K766 Portal hypertension: Secondary | ICD-10-CM | POA: Diagnosis present

## 2015-06-04 DIAGNOSIS — E8889 Other specified metabolic disorders: Secondary | ICD-10-CM | POA: Diagnosis present

## 2015-06-04 DIAGNOSIS — R262 Difficulty in walking, not elsewhere classified: Secondary | ICD-10-CM | POA: Diagnosis present

## 2015-06-04 DIAGNOSIS — Z809 Family history of malignant neoplasm, unspecified: Secondary | ICD-10-CM

## 2015-06-04 DIAGNOSIS — Z9119 Patient's noncompliance with other medical treatment and regimen: Secondary | ICD-10-CM

## 2015-06-04 DIAGNOSIS — L02212 Cutaneous abscess of back [any part, except buttock]: Principal | ICD-10-CM | POA: Diagnosis present

## 2015-06-04 DIAGNOSIS — F1410 Cocaine abuse, uncomplicated: Secondary | ICD-10-CM | POA: Diagnosis present

## 2015-06-04 DIAGNOSIS — F1490 Cocaine use, unspecified, uncomplicated: Secondary | ICD-10-CM | POA: Diagnosis present

## 2015-06-04 DIAGNOSIS — Z9115 Patient's noncompliance with renal dialysis: Secondary | ICD-10-CM

## 2015-06-04 DIAGNOSIS — Z91030 Bee allergy status: Secondary | ICD-10-CM

## 2015-06-04 DIAGNOSIS — Z992 Dependence on renal dialysis: Secondary | ICD-10-CM

## 2015-06-04 DIAGNOSIS — D72829 Elevated white blood cell count, unspecified: Secondary | ICD-10-CM

## 2015-06-04 DIAGNOSIS — D696 Thrombocytopenia, unspecified: Secondary | ICD-10-CM | POA: Diagnosis present

## 2015-06-04 DIAGNOSIS — Z823 Family history of stroke: Secondary | ICD-10-CM

## 2015-06-04 DIAGNOSIS — E8770 Fluid overload, unspecified: Secondary | ICD-10-CM | POA: Diagnosis present

## 2015-06-04 DIAGNOSIS — M310 Hypersensitivity angiitis: Secondary | ICD-10-CM | POA: Diagnosis present

## 2015-06-04 DIAGNOSIS — D61818 Other pancytopenia: Secondary | ICD-10-CM | POA: Diagnosis present

## 2015-06-04 DIAGNOSIS — D72819 Decreased white blood cell count, unspecified: Secondary | ICD-10-CM | POA: Diagnosis present

## 2015-06-04 DIAGNOSIS — I951 Orthostatic hypotension: Secondary | ICD-10-CM | POA: Diagnosis present

## 2015-06-04 DIAGNOSIS — N186 End stage renal disease: Secondary | ICD-10-CM | POA: Diagnosis present

## 2015-06-04 DIAGNOSIS — A419 Sepsis, unspecified organism: Secondary | ICD-10-CM | POA: Diagnosis present

## 2015-06-04 DIAGNOSIS — Z885 Allergy status to narcotic agent status: Secondary | ICD-10-CM

## 2015-06-04 DIAGNOSIS — K5900 Constipation, unspecified: Secondary | ICD-10-CM | POA: Diagnosis present

## 2015-06-04 LAB — URINALYSIS, ROUTINE W REFLEX MICROSCOPIC
Bilirubin Urine: NEGATIVE
Glucose, UA: NEGATIVE mg/dL
Ketones, ur: NEGATIVE mg/dL
LEUKOCYTES UA: NEGATIVE
NITRITE: NEGATIVE
PH: 7.5 (ref 5.0–8.0)
Protein, ur: 30 mg/dL — AB
SPECIFIC GRAVITY, URINE: 1.013 (ref 1.005–1.030)

## 2015-06-04 LAB — URINE MICROSCOPIC-ADD ON

## 2015-06-04 MED ORDER — VANCOMYCIN HCL IN DEXTROSE 1-5 GM/200ML-% IV SOLN
1000.0000 mg | Freq: Once | INTRAVENOUS | Status: AC
  Administered 2015-06-05: 1000 mg via INTRAVENOUS
  Filled 2015-06-04: qty 200

## 2015-06-04 MED ORDER — SODIUM CHLORIDE 0.9 % IV SOLN
1000.0000 mL | INTRAVENOUS | Status: DC
  Administered 2015-06-04: 1000 mL via INTRAVENOUS

## 2015-06-04 MED ORDER — PIPERACILLIN-TAZOBACTAM 3.375 G IVPB 30 MIN
3.3750 g | Freq: Once | INTRAVENOUS | Status: AC
  Administered 2015-06-05: 3.375 g via INTRAVENOUS
  Filled 2015-06-04: qty 50

## 2015-06-04 MED ORDER — LIDOCAINE HCL (PF) 1 % IJ SOLN
INTRAMUSCULAR | Status: DC
Start: 2015-06-04 — End: 2015-06-05
  Filled 2015-06-04: qty 10

## 2015-06-04 MED ORDER — LIDOCAINE HCL 2 % IJ SOLN
10.0000 mL | Freq: Once | INTRAMUSCULAR | Status: AC
  Administered 2015-06-05: 10 mL
  Filled 2015-06-04: qty 20

## 2015-06-04 NOTE — ED Notes (Signed)
Pt brought to ED by [facility] from home for generalized weakness, pt states he had a  Paracentesis done today here 5.25L out, he got home and started feeling very weak unable to walk by him self. Pt is a HD T, TH, Saturday, las HD done on Saturday, VSS BP 137/77, HR 70, R-18, SPO2 96% on RA.

## 2015-06-04 NOTE — ED Provider Notes (Signed)
CSN: [id number]     Arrival date & time 06/04/15  2303 History  By signing my name below, I, Altamease Oiler, attest that this documentation has been prepared under the direction and in the presence of Orpah Greek, MD. Electronically Signed: Altamease Oiler, ED Scribe. 06/05/2015. 2:10 AM   Chief Complaint  Patient presents with  . Fatigue   The history is provided by the patient. No language interpreter was used.   Alexander Grant. is a 60 y.o. 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 He has had difficulty walking due to the weakness. The pt also complains of a red and painful abscess at his back. Pt denies abdominal pain, SOB, and cough. He is scheduled to have dialysis tomorrow.    Past Medical History  Diagnosis Date  . Fournier gangrene     with peri-rectal/perineal abscess drainage  . PONV (postoperative nausea and vomiting)   . Wegener's granulomatosis with renal involvement ([place])   . Anemia   . History of blood transfusion 08/2011  . Lower GI bleeding 10/01/2011  . Cocaine abuse 2013  . Tubular adenoma of colon   . Hyponatremia   . Acute renal failure ([place])   . Pulmonary infiltrate   . Reflux esophagitis   . Diverticulosis   . Right thyroid nodule   . Cardiomegaly   . Renal disorder     Dialysis  . Kidney stone   . Hypertension   . Upper GI bleed 04/2015   Past Surgical History  Procedure Laterality Date  . Esophagogastroduodenoscopy  09/10/2011    Procedure: ESOPHAGOGASTRODUODENOSCOPY (EGD);  Surgeon: Jerene Bears, MD;  Location: [place];  Service: Gastroenterology;  Laterality: N/A;  . Colonoscopy  09/11/2011    Procedure: COLONOSCOPY;  Surgeon: Jerene Bears, MD;  Location:  [place];  Service: Gastroenterology;  Laterality: N/A;  . Knee arthroscopy w/ medial collateral ligament (mcl) repair  ~ 2000    left  . Knee arthroscopy w/ acl reconstruction  2002  . Shoulder open rotator cuff repair   09/1999    right  . Incision and drainage perirectal abscess  05/2008    'for Fournier's gangrene"  . Kidney stone surgery  1990  . Vasectomy    . Fracture surgery Right     wrist and great toe  . Av fistula placement Left 02/07/2013    Procedure: ARTERIOVENOUS (AV) FISTULA CREATION- LEFT BRACHIOCEPHALIC ;  Surgeon: Rosetta Posner, MD;  Location: [place];  Service: Vascular;  Laterality: Left;  . Insertion of dialysis catheter N/A 02/07/2013    Procedure: INSERTION OF DIALYSIS CATHETER;  Surgeon: Rosetta Posner, MD;  Location: [place];  Service: Vascular;  Laterality: N/A;  . Anterior cruciate ligament repair    . Shoulder surgery Left   . Esophagogastroduodenoscopy N/A 05/09/2015    Procedure: ESOPHAGOGASTRODUODENOSCOPY (EGD);  Surgeon: Manus Gunning, MD;  Location: [place];  Service: Gastroenterology;  Laterality: N/A;   Family History  Problem Relation Age of Onset  . Stroke Mother   . Cancer Father    Social History  Substance Use Topics  . Smoking status: Never Smoker   . Smokeless tobacco: Never Used  . Alcohol Use: No     Comment: 10/01/2011 "last alcohol ~ 2012"    Review  of Systems  Constitutional: Positive for fatigue.  Skin: Positive for color change.       Abscess at the mid back   Neurological: Positive for weakness.  All other systems reviewed and are negative.  Allergies  Bee pollen; Bee venom; and Percocet  Home Medications   Prior to Admission medications   Medication Sig Start Date End Date Taking? Authorizing Provider  Amino Acids-Protein Hydrolys (FEEDING SUPPLEMENT, PRO-STAT SUGAR FREE 64,) LIQD Take 30 mLs by mouth 2 (two) times daily. 05/23/15  Yes Liberty Handy, MD  Darbepoetin Alfa (ARANESP) 200 MCG/0.4ML SOSY injection  Inject 0.4 mLs (200 mcg total) into the vein every Tuesday with hemodialysis. 05/12/15  Yes Liberty Handy, MD  HYDROcodone-acetaminophen (NORCO/VICODIN) 5-325 MG tablet Take 1 tablet by mouth every 6 (six) hours as needed for moderate pain. 05/30/15  Yes Liberty Handy, MD  hydrocortisone cream 1 % Apply topically 4 (four) times daily. 05/23/15  Yes Liberty Handy, MD  multivitamin (RENA-VIT) TABS tablet Take 1 tablet by mouth at bedtime. 05/12/15  Yes Liberty Handy, MD  Nutritional Supplements (FEEDING SUPPLEMENT, NEPRO CARB STEADY,) LIQD Take 237 mLs by mouth 3 (three) times daily between meals. 05/12/15  Yes Liberty Handy, MD  ondansetron (ZOFRAN) 4 MG tablet Take 1 tablet (4 mg total) by mouth every 6 (six) hours as needed for nausea. 05/12/15  Yes Liberty Handy, MD  pantoprazole (PROTONIX) 40 MG tablet Take 1 tablet (40 mg total) by mouth 2 (two) times daily. 05/12/15  Yes Liberty Handy, MD  polyethylene glycol [facility] / GLYCOLAX) packet Take 17 g by mouth daily. Patient taking differently: Take 17 g by mouth daily as needed for moderate constipation.  05/12/15  Yes Liberty Handy, MD  sevelamer carbonate (RENVELA) 800 MG tablet Take 3 tablets (2,400 mg total) by mouth 3 (three) times daily with meals. Patient taking differently: Take 800-2,400 mg by mouth 3 (three) times daily with meals. Takes 3 tabs with each meal and 1 tab with each snack 05/12/15  Yes Liberty Handy, MD  predniSONE (DELTASONE) 20 MG tablet Take 2 tablets daily for 3 days. Then take 1 tablet daily for 2 days. Patient not taking: Reported on 06/05/2015 05/23/15   Liberty Handy, MD   BP 133/72 mmHg  Pulse 74  Temp(Src) 100.1 F (37.8 C) (Rectal)  Resp 20  Ht 5\' 9"  (1.753 m)  Wt 160 lb (72.576 kg)  BMI 23.62 kg/m2  SpO2 100% Physical Exam  Constitutional: He is oriented to person, place, and time. He appears well-developed and well-nourished. No distress.  HENT:  Head: Normocephalic and atraumatic.  Right Ear: Hearing normal.  Left Ear: Hearing  normal.  Nose: Nose normal.  Mouth/Throat: Oropharynx is clear and moist and mucous membranes are normal.  Eyes: Conjunctivae and EOM are normal. Pupils are equal, round, and reactive to light.  Neck: Normal range of motion. Neck supple.  Cardiovascular: Regular rhythm, S1 normal and S2 normal.  Exam reveals no gallop and no friction rub.   No murmur heard. Pulmonary/Chest: Effort normal and breath sounds normal. No respiratory distress. He exhibits no tenderness.  Abdominal: Soft. Normal appearance and bowel sounds are normal. There is no hepatosplenomegaly. There is no tenderness. There is no rebound, no guarding, no tenderness at McBurney's point and negative Murphy's sign. No hernia.  Musculoskeletal: Normal range of motion.  Neurological: He is alert and oriented to person, place, and time. He has normal strength. No cranial nerve deficit or sensory deficit. Coordination normal. GCS eye subscore is  4. GCS verbal subscore is 5. GCS motor subscore is 6.  Skin: Skin is warm, dry and intact. No rash noted. There is erythema. No cyanosis.   3X3 cm tender, fluctuant nodule at the mid back with erythema and warmth.  Sacral erythema with a small 4 mm ulcerative area.   Psychiatric: He has a normal mood and affect. His speech is normal and behavior is normal. Thought content normal.  Nursing note and vitals reviewed.   ED Course  Procedures (including critical care time)  INCISION AND DRAINAGE Performed by: Orpah Greek. Consent: Verbal consent obtained. Risks and benefits: risks, benefits and alternatives were discussed Type: abscess  Body area: back  Anesthesia: local infiltration  Incision was made with a scalpel.  Local anesthetic: lidocaine 2% without epinephrine  Anesthetic total: 5 ml  Complexity: complex Blunt dissection to break up loculations  Drainage: purulent  Drainage amount: none  Packing material: none  Patient tolerance: Patient tolerated the  procedure well with no immediate complications.     DIAGNOSTIC STUDIES: Oxygen Saturation is 100% on RA,  normal by my interpretation.    COORDINATION OF CARE: 11:11 PM Discussed treatment plan which includes lab work, CXR, EKG, and I&D with pt at bedside and pt agreed to plan.  2:05 AM-Consult complete with Internal Medicine Resident. Patient case explained and discussed. Agrees to admit patient for further evaluation and treatment. Call ended at 2:09 AM  3:04 AM-Consult complete with Dr. Posey Pronto (Nephrology). Patient case explained and discussed. Call ended at 3:05 AM    Labs Review Labs Reviewed  COMPREHENSIVE METABOLIC PANEL - Abnormal; Notable for the following:    Sodium 130 (*)    Potassium >7.5 (*)    Chloride 91 (*)    CO2 18 (*)    BUN 178 (*)    Creatinine, Ser 14.24 (*)    Total Protein 5.7 (*)    Albumin 1.7 (*)    GFR calc non Af Amer 3 (*)    GFR calc Af Amer 4 (*)    Anion gap 21 (*)    All other components within normal limits  CBC WITH DIFFERENTIAL/PLATELET - Abnormal; Notable for the following:    WBC 1.5 (*)    Hemoglobin 17.1 (*)    RDW 18.0 (*)    Platelets 111 (*)    Neutro Abs 0.6 (*)    Lymphs Abs 0.3 (*)    All other components within normal limits  URINALYSIS, ROUTINE W REFLEX MICROSCOPIC (NOT AT [facility]) - Abnormal; Notable for the following:    Hgb urine dipstick MODERATE (*)    Protein, ur 30 (*)    All other components within normal limits  URINE MICROSCOPIC-ADD ON - Abnormal; Notable for the following:    Squamous Epithelial / LPF 0-5 (*)    Bacteria, UA FEW (*)    All other components within normal limits  I-STAT CG4 LACTIC ACID, ED - Abnormal; Notable for the following:    Lactic Acid, Venous 2.07 (*)    All other components within normal limits  CULTURE, BLOOD (ROUTINE X 2)  CULTURE, BLOOD (ROUTINE X 2)  URINE CULTURE  WOUND CULTURE  PROTIME-INR  I-STAT CG4 LACTIC ACID, ED    Imaging Review US Paracentesis  06/04/2015   INDICATION: Ascites EXAM: ULTRASOUND-GUIDED PARACENTESIS COMPARISON:  None. MEDICATIONS: 10 cc 1% lidocaine COMPLICATIONS: None immediate. TECHNIQUE: Informed written consent was obtained from the patient after a discussion of the risks, benefits and alternatives to treatment. A timeout  was performed prior to the initiation of the procedure. Initial ultrasound scanning demonstrates a large amount of ascites within the right lower abdominal quadrant. The right lower abdomen was prepped and draped in the usual sterile fashion. 1% lidocaine with epinephrine was used for local anesthesia. Under direct ultrasound guidance, a 19 gauge, 7-cm, Yueh catheter was introduced. An ultrasound image was saved for documentation purposed. The paracentesis was performed. The catheter was removed and a dressing was applied. The patient tolerated the procedure well without immediate post procedural complication. FINDINGS: A total of approximately 5.6 liters of yellow fluid was removed. IMPRESSION: Successful ultrasound-guided paracentesis yielding 5.6 liters of peritoneal fluid. Read by:  Lavonia Drafts [facility] Electronically Signed   By: Jerilynn Mages.  Shick M.D.   On: 06/04/2015 14:35   Dg Chest Port 1 View  06/05/2015  CLINICAL DATA:  Weakness and fever. EXAM: PORTABLE CHEST 1 VIEW COMPARISON:  05/19/2015 FINDINGS: A single AP portable view of the chest demonstrates no focal airspace consolidation or alveolar edema. The lungs are grossly clear. There is no large effusion or pneumothorax. There is unchanged cardiomegaly. Cardiac and mediastinal contours are otherwise unremarkable. IMPRESSION: Stable cardiomegaly.  No acute cardiopulmonary findings. Electronically Signed   By: Andreas [facility] M.D.   On: 06/05/2015 00:02   I have personally reviewed and evaluated these images and lab results as part of my medical decision-making.   EKG Interpretation None      MDM   Final diagnoses:  Hyperkalemia  Cellulitis of back except buttock   ESRD (end stage renal disease) ([place])  Leukocytosis    Patient presented to the emergency department for evaluation of generalized weakness. Patient has a Complicated history of end-stage renal disease and hemodialysis as well as cirrhosis. Patient was hospitalized week ago and had large volume paracentesis. He had repeat outpatient paracentesis today. After he left the hospital from the procedure he went home and started to feel extremely weak. He sat in a chair and was unable to get up because of the generalized weakness.  EMS brings him to the emergency department. He has not been hypotensive for EMS. EMS report orthostatic pressures were checked and negative prior to arrival.  Patient was warm to the touch and found to be exhibiting low-grade fever. Examining his back reveals a large erythematous, warm, tender nodule. This was felt to likely be an abscess. I recommended incision and drainage and he did consent. After numbing the area and performing an incision, however, no abscess cavity was encountered.  Bloodwork reveals severe leukocytosis and borderline neutropenia. He received empiric broad-spectrum antibiotics in the form of Zosyn and vancomycin at arrival for his skin infection. He has not been hypotensive, tachycardic and his lactate was just over 2, did not require sepsis protocol fluids.  Patient was, however, found to be significantly hyperkalemic. He is due for dialysis in the morning. EKG shows widening of the QRS with his potassium greater than 7.5. Patient administered empiric treatment for hyperkalemia. Discussed with Dr. Posey Pronto, nephrology. He will evaluate the patient for dialysis.  CRITICAL CARE Performed by: Orpah Greek   Total critical care time: 35 minutes  Critical care time was exclusive of separately billable procedures and treating other patients.  Critical care was necessary to treat or prevent imminent or life-threatening deterioration.  Critical  care was time spent personally by me on the following activities: development of treatment plan with patient and/or surrogate as well as nursing, discussions with consultants, evaluation of patient's response to treatment,  examination of patient, obtaining history from patient or surrogate, ordering and performing treatments and interventions, ordering and review of laboratory studies, ordering and review of radiographic studies, pulse oximetry and re-evaluation of patient's condition.   I personally performed the services described in this documentation, which was scribed in my presence. The recorded information has been reviewed and is accurate.    Orpah Greek, MD 06/05/15 [phone]

## 2015-06-04 NOTE — ED Notes (Signed)
Called carelink to activate Code Sepsis

## 2015-06-05 ENCOUNTER — Other Ambulatory Visit: Payer: Self-pay | Admitting: Radiology

## 2015-06-05 DIAGNOSIS — A419 Sepsis, unspecified organism: Secondary | ICD-10-CM | POA: Diagnosis present

## 2015-06-05 DIAGNOSIS — D61818 Other pancytopenia: Secondary | ICD-10-CM | POA: Diagnosis present

## 2015-06-05 DIAGNOSIS — M3130 Wegener's granulomatosis without renal involvement: Secondary | ICD-10-CM

## 2015-06-05 DIAGNOSIS — Z79899 Other long term (current) drug therapy: Secondary | ICD-10-CM

## 2015-06-05 DIAGNOSIS — F1410 Cocaine abuse, uncomplicated: Secondary | ICD-10-CM | POA: Diagnosis present

## 2015-06-05 DIAGNOSIS — E8770 Fluid overload, unspecified: Secondary | ICD-10-CM | POA: Diagnosis present

## 2015-06-05 DIAGNOSIS — Z992 Dependence on renal dialysis: Secondary | ICD-10-CM | POA: Diagnosis not present

## 2015-06-05 DIAGNOSIS — L03312 Cellulitis of back [any part except buttock]: Secondary | ICD-10-CM | POA: Diagnosis present

## 2015-06-05 DIAGNOSIS — K5909 Other constipation: Secondary | ICD-10-CM

## 2015-06-05 DIAGNOSIS — L02212 Cutaneous abscess of back [any part, except buttock]: Secondary | ICD-10-CM | POA: Diagnosis not present

## 2015-06-05 DIAGNOSIS — K766 Portal hypertension: Secondary | ICD-10-CM | POA: Diagnosis present

## 2015-06-05 DIAGNOSIS — Z823 Family history of stroke: Secondary | ICD-10-CM | POA: Diagnosis not present

## 2015-06-05 DIAGNOSIS — Z9889 Other specified postprocedural states: Secondary | ICD-10-CM

## 2015-06-05 DIAGNOSIS — E875 Hyperkalemia: Secondary | ICD-10-CM | POA: Diagnosis present

## 2015-06-05 DIAGNOSIS — Z9119 Patient's noncompliance with other medical treatment and regimen: Secondary | ICD-10-CM | POA: Diagnosis not present

## 2015-06-05 DIAGNOSIS — E872 Acidosis: Secondary | ICD-10-CM | POA: Diagnosis present

## 2015-06-05 DIAGNOSIS — Z91030 Bee allergy status: Secondary | ICD-10-CM | POA: Diagnosis not present

## 2015-06-05 DIAGNOSIS — Z9115 Patient's noncompliance with renal dialysis: Secondary | ICD-10-CM | POA: Diagnosis not present

## 2015-06-05 DIAGNOSIS — R262 Difficulty in walking, not elsewhere classified: Secondary | ICD-10-CM | POA: Diagnosis present

## 2015-06-05 DIAGNOSIS — R531 Weakness: Secondary | ICD-10-CM | POA: Diagnosis present

## 2015-06-05 DIAGNOSIS — N186 End stage renal disease: Secondary | ICD-10-CM | POA: Diagnosis present

## 2015-06-05 DIAGNOSIS — K7469 Other cirrhosis of liver: Secondary | ICD-10-CM

## 2015-06-05 DIAGNOSIS — K209 Esophagitis, unspecified: Secondary | ICD-10-CM

## 2015-06-05 DIAGNOSIS — Z885 Allergy status to narcotic agent status: Secondary | ICD-10-CM | POA: Diagnosis not present

## 2015-06-05 DIAGNOSIS — Z809 Family history of malignant neoplasm, unspecified: Secondary | ICD-10-CM | POA: Diagnosis not present

## 2015-06-05 DIAGNOSIS — D72819 Decreased white blood cell count, unspecified: Secondary | ICD-10-CM | POA: Diagnosis present

## 2015-06-05 DIAGNOSIS — I951 Orthostatic hypotension: Secondary | ICD-10-CM | POA: Diagnosis present

## 2015-06-05 DIAGNOSIS — F1490 Cocaine use, unspecified, uncomplicated: Secondary | ICD-10-CM | POA: Diagnosis present

## 2015-06-05 DIAGNOSIS — E8889 Other specified metabolic disorders: Secondary | ICD-10-CM | POA: Diagnosis present

## 2015-06-05 DIAGNOSIS — D72818 Other decreased white blood cell count: Secondary | ICD-10-CM

## 2015-06-05 DIAGNOSIS — R188 Other ascites: Secondary | ICD-10-CM | POA: Diagnosis present

## 2015-06-05 DIAGNOSIS — M310 Hypersensitivity angiitis: Secondary | ICD-10-CM

## 2015-06-05 DIAGNOSIS — K5900 Constipation, unspecified: Secondary | ICD-10-CM | POA: Diagnosis present

## 2015-06-05 LAB — CBC WITH DIFFERENTIAL/PLATELET
BASOS PCT: 1 %
Basophils Absolute: 0 10*3/uL (ref 0.0–0.1)
EOS ABS: 0 10*3/uL (ref 0.0–0.7)
EOS PCT: 2 %
HCT: 51.5 % (ref 39.0–52.0)
HEMOGLOBIN: 17.1 g/dL — AB (ref 13.0–17.0)
LYMPHS PCT: 17 %
Lymphs Abs: 0.3 10*3/uL — ABNORMAL LOW (ref 0.7–4.0)
MCH: 30.2 pg (ref 26.0–34.0)
MCHC: 33.2 g/dL (ref 30.0–36.0)
MCV: 91 fL (ref 78.0–100.0)
Monocytes Absolute: 0.6 10*3/uL (ref 0.1–1.0)
Monocytes Relative: 40 %
NEUTROS ABS: 0.6 10*3/uL — AB (ref 1.7–7.7)
Neutrophils Relative %: 40 %
Platelets: 111 10*3/uL — ABNORMAL LOW (ref 150–400)
RBC: 5.66 MIL/uL (ref 4.22–5.81)
RDW: 18 % — ABNORMAL HIGH (ref 11.5–15.5)
WBC: 1.5 10*3/uL — ABNORMAL LOW (ref 4.0–10.5)

## 2015-06-05 LAB — GLUCOSE, CAPILLARY
Glucose-Capillary: 47 mg/dL — ABNORMAL LOW (ref 65–99)
Glucose-Capillary: 117 mg/dL — ABNORMAL HIGH (ref 65–99)
Glucose-Capillary: 99 mg/dL (ref 65–99)

## 2015-06-05 LAB — PATHOLOGIST SMEAR REVIEW

## 2015-06-05 LAB — COMPREHENSIVE METABOLIC PANEL
ALBUMIN: 1.3 g/dL — AB (ref 3.5–5.0)
ALBUMIN: 1.7 g/dL — AB (ref 3.5–5.0)
ALK PHOS: 89 U/L (ref 38–126)
ALT: 15 U/L — ABNORMAL LOW (ref 17–63)
ALT: 19 U/L (ref 17–63)
ANION GAP: 21 — AB (ref 5–15)
AST: 19 U/L (ref 15–41)
AST: 25 U/L (ref 15–41)
Alkaline Phosphatase: 70 U/L (ref 38–126)
Anion gap: 21 — ABNORMAL HIGH (ref 5–15)
BUN: 178 mg/dL — ABNORMAL HIGH (ref 6–20)
BUN: 181 mg/dL — ABNORMAL HIGH (ref 6–20)
CALCIUM: 9.3 mg/dL (ref 8.9–10.3)
CHLORIDE: 96 mmol/L — AB (ref 101–111)
CO2: 14 mmol/L — AB (ref 22–32)
CO2: 18 mmol/L — AB (ref 22–32)
CREATININE: 14.24 mg/dL — AB (ref 0.61–1.24)
Calcium: 8.4 mg/dL — ABNORMAL LOW (ref 8.9–10.3)
Chloride: 91 mmol/L — ABNORMAL LOW (ref 101–111)
Creatinine, Ser: 13.47 mg/dL — ABNORMAL HIGH (ref 0.61–1.24)
GFR calc Af Amer: 4 mL/min — ABNORMAL LOW (ref >60)
GFR calc non Af Amer: 3 mL/min — ABNORMAL LOW (ref >60)
GFR calc non Af Amer: 3 mL/min — ABNORMAL LOW (ref >60)
GFR, EST AFRICAN AMERICAN: 4 mL/min — AB (ref >60)
GLUCOSE: 52 mg/dL — AB (ref 65–99)
GLUCOSE: 89 mg/dL (ref 65–99)
Potassium: 7.2 mmol/L (ref 3.5–5.1)
Potassium: >7.5 mmol/L (ref 3.5–5.1)
SODIUM: 131 mmol/L — AB (ref 135–145)
SODIUM: 130 mmol/L — AB (ref 135–145)
Total Bilirubin: 0.8 mg/dL (ref 0.3–1.2)
Total Bilirubin: 1.1 mg/dL (ref 0.3–1.2)
Total Protein: 4.4 g/dL — ABNORMAL LOW (ref 6.5–8.1)
Total Protein: 5.7 g/dL — ABNORMAL LOW (ref 6.5–8.1)

## 2015-06-05 LAB — I-STAT CG4 LACTIC ACID, ED
Lactic Acid, Venous: 2.07 mmol/L (ref 0.5–2.0)
Lactic Acid, Venous: 2.52 mmol/L (ref 0.5–2.0)

## 2015-06-05 LAB — CBC
HEMATOCRIT: 28.5 % — AB (ref 39.0–52.0)
HEMOGLOBIN: 9.1 g/dL — AB (ref 13.0–17.0)
MCH: 29.2 pg (ref 26.0–34.0)
MCHC: 31.9 g/dL (ref 30.0–36.0)
MCV: 91.3 fL (ref 78.0–100.0)
Platelets: 143 10*3/uL — ABNORMAL LOW (ref 150–400)
RBC: 3.12 MIL/uL — AB (ref 4.22–5.81)
RDW: 17.9 % — ABNORMAL HIGH (ref 11.5–15.5)
WBC: 2.5 10*3/uL — ABNORMAL LOW (ref 4.0–10.5)

## 2015-06-05 LAB — PROTIME-INR
INR: 1.4 (ref 0.00–1.49)
Prothrombin Time: 17.3 seconds — ABNORMAL HIGH (ref 11.6–15.2)

## 2015-06-05 MED ORDER — DEXTROSE 50 % IV SOLN
INTRAVENOUS | Status: AC
  Administered 2015-06-05: 50 mL
  Filled 2015-06-05: qty 50

## 2015-06-05 MED ORDER — HEPARIN SODIUM (PORCINE) 1000 UNIT/ML DIALYSIS: 1000.0000 [IU] | INTRAMUSCULAR | Status: DC | PRN

## 2015-06-05 MED ORDER — HEPARIN SODIUM (PORCINE) 5000 UNIT/ML IJ SOLN
5000.0000 [IU] | Freq: Three times a day (TID) | INTRAMUSCULAR | Status: DC
  Administered 2015-06-05 – 2015-06-06 (×3): 5000 [IU] via SUBCUTANEOUS
  Filled 2015-06-05 (×3): qty 1

## 2015-06-05 MED ORDER — PANTOPRAZOLE SODIUM 40 MG PO TBEC
40.0000 mg | DELAYED_RELEASE_TABLET | Freq: Two times a day (BID) | ORAL | Status: DC
  Administered 2015-06-05 – 2015-06-06 (×4): 40 mg via ORAL
  Filled 2015-06-05 (×4): qty 1

## 2015-06-05 MED ORDER — SODIUM POLYSTYRENE SULFONATE 15 GM/60ML PO SUSP
30.0000 g | Freq: Once | ORAL | Status: AC
  Administered 2015-06-05: 30 g via ORAL
  Filled 2015-06-05: qty 120

## 2015-06-05 MED ORDER — NEPRO/CARBSTEADY PO LIQD
237.0000 mL | Freq: Three times a day (TID) | ORAL | Status: DC
  Administered 2015-06-05 (×3): 237 mL via ORAL

## 2015-06-05 MED ORDER — LIDOCAINE HCL (PF) 1 % IJ SOLN: 5.0000 mL | INTRAMUSCULAR | Status: DC | PRN

## 2015-06-05 MED ORDER — VANCOMYCIN HCL IN DEXTROSE 750-5 MG/150ML-% IV SOLN
750.0000 mg | INTRAVENOUS | Status: DC
  Administered 2015-06-05: 750 mg via INTRAVENOUS
  Filled 2015-06-05: qty 150

## 2015-06-05 MED ORDER — PENTAFLUOROPROP-TETRAFLUOROETH EX AERO: 1.0000 "application " | INHALATION_SPRAY | CUTANEOUS | Status: DC | PRN

## 2015-06-05 MED ORDER — DEXTROSE 50 % IV SOLN
1.0000 | Freq: Once | INTRAVENOUS | Status: AC
  Administered 2015-06-05: 50 mL via INTRAVENOUS
  Filled 2015-06-05: qty 50

## 2015-06-05 MED ORDER — SODIUM CHLORIDE 0.9 % IV SOLN: 100.0000 mL | INTRAVENOUS | Status: DC | PRN

## 2015-06-05 MED ORDER — POLYETHYLENE GLYCOL 3350 17 G PO PACK: 17.0000 g | PACK | Freq: Every day | ORAL | Status: DC | PRN

## 2015-06-05 MED ORDER — HYDROCORTISONE 1 % EX CREA
TOPICAL_CREAM | Freq: Four times a day (QID) | CUTANEOUS | Status: DC
  Administered 2015-06-05 – 2015-06-06 (×6): via TOPICAL
  Filled 2015-06-05: qty 28

## 2015-06-05 MED ORDER — CALCIUM GLUCONATE 10 % IV SOLN
1.0000 g | Freq: Once | INTRAVENOUS | Status: AC
  Administered 2015-06-05: 1 g via INTRAVENOUS
  Filled 2015-06-05: qty 10

## 2015-06-05 MED ORDER — PIPERACILLIN-TAZOBACTAM IN DEX 2-0.25 GM/50ML IV SOLN
2.2500 g | Freq: Three times a day (TID) | INTRAVENOUS | Status: DC
  Administered 2015-06-05 – 2015-06-06 (×4): 2.25 g via INTRAVENOUS
  Filled 2015-06-05 (×5): qty 50

## 2015-06-05 MED ORDER — ALBUTEROL SULFATE (2.5 MG/3ML) 0.083% IN NEBU
10.0000 mg | INHALATION_SOLUTION | Freq: Once | RESPIRATORY_TRACT | Status: AC
  Administered 2015-06-05: 10 mg via RESPIRATORY_TRACT
  Filled 2015-06-05: qty 12

## 2015-06-05 MED ORDER — VANCOMYCIN HCL 500 MG IV SOLR
500.0000 mg | Freq: Once | INTRAVENOUS | Status: AC
  Administered 2015-06-05: 500 mg via INTRAVENOUS
  Filled 2015-06-05: qty 500

## 2015-06-05 MED ORDER — INSULIN ASPART 100 UNIT/ML IV SOLN
10.0000 [IU] | Freq: Once | INTRAVENOUS | Status: AC
  Administered 2015-06-05: 10 [IU] via INTRAVENOUS
  Filled 2015-06-05: qty 1

## 2015-06-05 MED ORDER — FENTANYL CITRATE (PF) 100 MCG/2ML IJ SOLN
50.0000 ug | INTRAMUSCULAR | Status: DC | PRN
  Administered 2015-06-05 (×2): 50 ug via INTRAVENOUS
  Filled 2015-06-05 (×2): qty 2

## 2015-06-05 MED ORDER — ALTEPLASE 2 MG IJ SOLR
2.0000 mg | Freq: Once | INTRAMUSCULAR | Status: DC | PRN
  Filled 2015-06-05: qty 2

## 2015-06-05 MED ORDER — LIDOCAINE-PRILOCAINE 2.5-2.5 % EX CREA: 1.0000 "application " | TOPICAL_CREAM | CUTANEOUS | Status: DC | PRN

## 2015-06-05 MED ORDER — ONDANSETRON HCL 4 MG PO TABS: 4.0000 mg | ORAL_TABLET | Freq: Four times a day (QID) | ORAL | Status: DC | PRN

## 2015-06-05 NOTE — Progress Notes (Signed)
Pharmacy Antibiotic Note  Alexander Grant. is a 60 y.o. male admitted on 06/04/2015 with sepsis.  Pharmacy has been consulted for Vancocin and Zosyn dosing.  Plan: Rec'd vanc 1g and Zosyn 3.375g in ED. Additional vanc 500mg  to complete load. Vancomycin 750mg  IV after each HD (goal level pre-HD 15-25). Zosyn 2.25g IV Q8H.  Height: 5\' 9"  (175.3 cm) Weight: 160 lb (72.576 kg) IBW/kg (Calculated) : 70.7  Temp (24hrs), Avg:99.7 F (37.6 C), Min:99.3 F (37.4 C), Max:100.1 F (37.8 C)   Recent Labs Lab 06/04/15 2337 06/05/15 0018 06/05/15 0244  WBC 1.5*  --   --   CREATININE 14.24*  --   --   LATICACIDVEN  --  2.07* 2.52*    Estimated Creatinine Clearance: 5.5 mL/min (by C-G formula based on Cr of 14.24).    Allergies  Allergen Reactions  . Bee Pollen Anaphylaxis, Shortness Of Breath and Rash  . Bee Venom Anaphylaxis, Shortness Of Breath and Rash  . Percocet [Oxycodone-Acetaminophen] Hives and Rash      Thank you for allowing pharmacy to be a part of this patient's care.  Wynona Neat, PharmD, BCPS  06/05/2015 2:52 AM

## 2015-06-05 NOTE — ED Notes (Signed)
CRITICAL VALUE ALERT  Critical value received:  Potassium >7.5 Dr. Betsey Holiday to be notified.

## 2015-06-05 NOTE — Consult Note (Signed)
Reason for Consult: Continuity of ESRD care-critical hyperkalemia Referring Physician: Murriel Hopper M.D. 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 Presented to the emergency room with global but primarily lower extremity weakness after paracentesis earlier today by interventional radiology. He denies any other focal neurological symptoms and provides conflicting details about "a boil in his back" that was drained by his brother yesterday morning to drain some pus at home. He has been undergoing frequent paracentesis for recurrent ascites and is being planned for transjugular liver biopsy to explain his portal hypertension.  I am asked to see him emergently for provision of dialysis after labs revealed a potassium >7.5. I have reviewed his outpatient records -- last his last dialysis treatment was on 05/29/15 (1 week ago). He has only gone to 2 out of the past 6 scheduled dialysis treatments. He offers no clear reason for missing his dialysis treatments  and states that he perceives that he is having recovery of his renal function because he makes "good amounts of urine".  Dialysis prescription: TTS at [facility] 4 hours,  180 dialyzer, blood flow rate 350, dialysate flow rate 800, EDW 69 kg, 2K/2.25 calcium, UF profile 2, no sodium modeling, no heparin. Aranesp  150 g weekly.  Past Medical History  Diagnosis Date  . Fournier gangrene     with peri-rectal/perineal abscess drainage  . PONV (postoperative nausea and vomiting)   . Wegener's granulomatosis with renal involvement ([place])   . Anemia   . History of blood transfusion 08/2011  . Lower GI bleeding 10/01/2011  . Cocaine abuse 2013  . Tubular adenoma of colon   . Hyponatremia   . Acute renal  failure ([place])   . Pulmonary infiltrate   . Reflux esophagitis   . Diverticulosis   . Right thyroid nodule   . Cardiomegaly   . Renal disorder     Dialysis  . Kidney stone   . Hypertension   . Upper GI bleed 04/2015    Past Surgical History  Procedure Laterality Date  . Esophagogastroduodenoscopy  09/10/2011    Procedure: ESOPHAGOGASTRODUODENOSCOPY (EGD);  Surgeon: Jerene Bears, MD;  Location: [place];  Service: Gastroenterology;  Laterality: N/A;  . Colonoscopy  09/11/2011    Procedure: COLONOSCOPY;  Surgeon: Jerene Bears, MD;  Location: [place];  Service: Gastroenterology;  Laterality: N/A;  . Knee arthroscopy w/ medial collateral ligament (mcl) repair  ~ 2000    left  . Knee arthroscopy w/ acl reconstruction  2002  . Shoulder open rotator cuff repair   09/1999    right  . Incision and drainage perirectal abscess  05/2008    'for Fournier's gangrene"  . Kidney stone surgery  1990  . Vasectomy    . Fracture surgery Right     wrist and great toe  . Av fistula placement Left 02/07/2013    Procedure: ARTERIOVENOUS (AV) FISTULA CREATION- LEFT BRACHIOCEPHALIC ;  Surgeon: Rosetta Posner, MD;  Location: [place];  Service: Vascular;  Laterality: Left;  . Insertion of dialysis catheter N/A 02/07/2013    Procedure: INSERTION OF DIALYSIS CATHETER;  Surgeon: Rosetta Posner, MD;  Location: [place];  Service: Vascular;  Laterality: N/A;  . Anterior cruciate ligament repair    . Shoulder surgery Left   . Esophagogastroduodenoscopy N/A 05/09/2015  Procedure: ESOPHAGOGASTRODUODENOSCOPY (EGD);  Surgeon: Manus Gunning, MD;  Location: [place];  Service: Gastroenterology;  Laterality: N/A;    Family History  Problem Relation Age of Onset  . Stroke Mother   . Cancer Father     Social History:  reports that he has never smoked. He has never used smokeless tobacco. He reports that he does not drink alcohol or use illicit drugs.  Allergies:  Allergies  Allergen Reactions  . Bee  Pollen Anaphylaxis, Shortness Of Breath and Rash  . Bee Venom Anaphylaxis, Shortness Of Breath and Rash  . Percocet [Oxycodone-Acetaminophen] Hives and Rash    Medications:  Scheduled: . feeding supplement (NEPRO CARB STEADY)  237 mL Oral TID BM  . heparin  5,000 Units Subcutaneous Q8H  . hydrocortisone cream   Topical QID  . pantoprazole  40 mg Oral BID  . vancomycin  750 mg Intravenous Q T,Th,Sa-HD    BMP Latest Ref Rng 06/04/2015 05/22/2015 05/21/2015  Glucose 65 - 99 mg/dL 89 145(H) -  BUN 6 - 20 mg/dL 178(H) 107(H) -  Creatinine 0.61 - 1.24 mg/dL 14.24(H) 12.66(H) -  Sodium 135 - 145 mmol/L 130(L) 130(L) -  Potassium 3.5 - 5.1 mmol/L >7.5(HH) 5.3(H) 4.9  Chloride 101 - 111 mmol/L 91(L) 87(L) -  CO2 22 - 32 mmol/L 18(L) 24 -  Calcium 8.9 - 10.3 mg/dL 9.3 8.2(L) -    CBC Latest Ref Rng 06/04/2015 05/22/2015 05/20/2015  WBC 4.0 - 10.5 K/uL 1.5(L) 6.0 3.2(L)  Hemoglobin 13.0 - 17.0 g/dL 17.1(H) 11.8(L) 10.1(L)  Hematocrit 39.0 - 52.0 % 51.5 36.9(L) 32.7(L)  Platelets 150 - 400 K/uL 111(L) 171 141(L)      US Paracentesis  06/04/2015  INDICATION: Ascites EXAM: ULTRASOUND-GUIDED PARACENTESIS COMPARISON:  None. MEDICATIONS: 10 cc 1% lidocaine COMPLICATIONS: None immediate. TECHNIQUE: Informed written consent was obtained from the patient after a discussion of the risks, benefits and alternatives to treatment. A timeout was performed prior to the initiation of the procedure. Initial ultrasound scanning demonstrates a large amount of ascites within the right lower abdominal quadrant. The right lower abdomen was prepped and draped in the usual sterile fashion. 1% lidocaine with epinephrine was used for local anesthesia. Under direct ultrasound guidance, a 19 gauge, 7-cm, Yueh catheter was introduced. An ultrasound image was saved for documentation purposed. The paracentesis was performed. The catheter was removed and a dressing was applied. The patient tolerated the procedure well without  immediate post procedural complication. FINDINGS: A total of approximately 5.6 liters of yellow fluid was removed. IMPRESSION: Successful ultrasound-guided paracentesis yielding 5.6 liters of peritoneal fluid. Read by:  Lavonia Drafts [facility] Electronically Signed   By: Jerilynn Mages.  Shick M.D.   On: 06/04/2015 14:35   Dg Chest Port 1 View  06/05/2015  CLINICAL DATA:  Weakness and fever. EXAM: PORTABLE CHEST 1 VIEW COMPARISON:  05/19/2015 FINDINGS: A single AP portable view of the chest demonstrates no focal airspace consolidation or alveolar edema. The lungs are grossly clear. There is no large effusion or pneumothorax. There is unchanged cardiomegaly. Cardiac and mediastinal contours are otherwise unremarkable. IMPRESSION: Stable cardiomegaly.  No acute cardiopulmonary findings. Electronically Signed   By: Andreas Newport M.D.   On: 06/05/2015 00:02    Review of Systems  Constitutional: Positive for malaise/fatigue.  HENT: Positive for congestion. Negative for nosebleeds.   Eyes: Negative.   Respiratory: Negative for cough, shortness of breath and wheezing.   Cardiovascular: Positive for leg swelling. Negative for chest pain, palpitations and orthopnea.  Gastrointestinal: Negative.   Genitourinary: Negative.   Skin: Positive for itching.  Neurological: Positive for focal weakness, weakness and headaches.   Blood pressure 121/72, pulse 87, temperature 100.1 F (37.8 C), temperature source Rectal, resp. rate 21, height 5\' 9"  (1.753 m), weight 72.576 kg (160 lb), SpO2 99 %. Physical Exam  Nursing note and vitals reviewed. Constitutional: He appears well-developed and well-nourished.  HENT:  Head: Normocephalic and atraumatic.  Nose: Nose normal.  Eyes: Pupils are equal, round, and reactive to light.  Cardiovascular: Normal rate and regular rhythm.   Murmur heard. 3/6 ejection systolic murmur  Respiratory: Breath sounds normal. He has no wheezes. He has no rales.  GI: Soft. Bowel sounds are normal.  He exhibits distension. There is no tenderness. There is no guarding.  Musculoskeletal: He exhibits edema.  1-2+ lower extremity edema. Left upper arm brachiocephalic fistula-pulsatile  Neurological: He is alert.  Skin: Skin is warm and dry.  Right mid back with focal indurated area status post  Incision and drainage-serosanguineous drainage    Assessment/Plan: 1. Hyperkalemia: this is most likely due to his noncompliance with hemodialysis and most likely noncompliance with the prescribed renal diet.  Given associated EKG changes and lower extremity weakness-emergent hemodialysis.He has been treated with temporizing medical measures with insulin/dextrose, calcium gluconate, sodium bicarbonate and Kayexalate in the emergency room. 2. End-stage renal disease: prescribed for Tuesday/Thursday/Saturday as an outpatient however records indicate that he is clearly noncompliant. Clinically appears to be volume overloaded and without much residual renal function in spite of good urine output. Discussed the importance of compliance but he is very reluctant to believe this. 3. Abscess over his back with associated cellulitis: Started on broad-spectrum antibiotic therapy with vancomycin and Zosyn together with empiric blood cultures done. He appears to be febrile at this time. 4. Ascites: With portal hypertension of unclear etioathology-transjugular liver biopsy scheduled. 5. Anemia: Initial labs indicate that he is currently polycythemic-hold ESA and recheck hemoglobin/hematocrit. 6. Metabolic bone disease: resume Renvela for phosphorus binding,  He does not appear to be on Hectorol from outpatient records.  Alexander Grant K. 06/05/2015, 3:48 AM

## 2015-06-05 NOTE — Progress Notes (Signed)
Subjective: Alexander Grant feels very well upon evaluation this morning. He reports the weakness in his legs has improved significantly, and that the pain/pressure associated with his back abscess is resolved following drainage last night. He is comfortable during his dialysis session, and requests PO food.   Objective: Vital signs in last 24 hours: Filed Vitals:   06/05/15 0858 06/05/15 0928 06/05/15 0948 06/05/15 1055  BP: 70/47 90/52 101/67 96/58  Pulse: 88 85 85 78  Temp:   98 F (36.7 C) 97.5 F (36.4 C)  TempSrc:   Oral Oral  Resp: 19 26 18 21   Height:      Weight:   69.3 kg (152 lb 12.5 oz)   SpO2:  100% 100% 97%   Weight change:   Intake/Output Summary (Last 24 hours) at 06/05/15 1157 Last data filed at 06/05/15 0948  Gross per 24 hour  Intake   1250 ml  Output   2300 ml  Net  -1050 ml   Physical Exam: GENERAL- Lying in bed with dialysis session running in NAD HEENT- Moist oral mucosa, PERRL, EOMI, Poor dentition with several caries, no cervical LAN CARDIAC- RRR, with normal S1 and S2. Admission exam noted 2/6 systolic murmur - I did not appreciate this on auscultation; Exam perhaps limited by noisy dialysis room.  RESP- CTAB, no wheezes or crackles. ABDOMEN- Normoactive bowel sounds. Soft, non-tender, non-distended. No prominent tympany or dullness. arding or rebound. No organomegaly or masses.  BACK- R mid back area of mild erythema s/p drainage of central 2x2cm area, with continued small sanguinous drainage onto dressing. Very mildly TTP NEURO- No obvious Cr N abnormality, strength upper and lower extremities- 5/5, Sensation intact globally EXTREMITIES- symmetric, 1+ pedal edema b/l, LUE HD access with palpable thrill SKIN- Erythematous region as above, warm and dry, no rashes on legs PSYCH- Appropriate mood, appropriate thought content and speech.  Lab Results: Results for orders placed or performed during the hospital encounter of 06/04/15 (from the past 24  hour(s))  Urinalysis, Routine w reflex microscopic (not at [facility])     Status: Abnormal   Collection Time: 06/04/15 11:21 PM  Result Value Ref Range   Color, Urine YELLOW YELLOW   APPearance CLEAR CLEAR   Specific Gravity, Urine 1.013 1.005 - 1.030   pH 7.5 5.0 - 8.0   Glucose, UA NEGATIVE NEGATIVE mg/dL   Hgb urine dipstick MODERATE (A) NEGATIVE   Bilirubin Urine NEGATIVE NEGATIVE   Ketones, ur NEGATIVE NEGATIVE mg/dL   Protein, ur 30 (A) NEGATIVE mg/dL   Nitrite NEGATIVE NEGATIVE   Leukocytes, UA NEGATIVE NEGATIVE  Urine microscopic-add on     Status: Abnormal   Collection Time: 06/04/15 11:21 PM  Result Value Ref Range   Squamous Epithelial / LPF 0-5 (A) NONE SEEN   WBC, UA 0-5 0 - 5 WBC/hpf   RBC / HPF 6-30 0 - 5 RBC/hpf   Bacteria, UA FEW (A) NONE SEEN  Comprehensive metabolic panel     Status: Abnormal   Collection Time: 06/04/15 11:37 PM  Result Value Ref Range   Sodium 130 (L) 135 - 145 mmol/L   Potassium >7.5 (HH) 3.5 - 5.1 mmol/L   Chloride 91 (L) 101 - 111 mmol/L   CO2 18 (L) 22 - 32 mmol/L   Glucose, Bld 89 65 - 99 mg/dL   BUN 178 (H) 6 - 20 mg/dL   Creatinine, Ser 14.24 (H) 0.61 - 1.24 mg/dL   Calcium 9.3 8.9 - 10.3 mg/dL   Total  Protein 5.7 (L) 6.5 - 8.1 g/dL   Albumin 1.7 (L) 3.5 - 5.0 g/dL   AST 19 15 - 41 U/L   ALT 19 17 - 63 U/L   Alkaline Phosphatase 89 38 - 126 U/L   Total Bilirubin 1.1 0.3 - 1.2 mg/dL   GFR calc non Af Amer 3 (L) >60 mL/min   GFR calc Af Amer 4 (L) >60 mL/min   Anion gap 21 (H) 5 - 15  CBC WITH DIFFERENTIAL     Status: Abnormal   Collection Time: 06/04/15 11:37 PM  Result Value Ref Range   WBC 1.5 (L) 4.0 - 10.5 K/uL   RBC 5.66 4.22 - 5.81 MIL/uL   Hemoglobin 17.1 (H) 13.0 - 17.0 g/dL   HCT 51.5 39.0 - 52.0 %   MCV 91.0 78.0 - 100.0 fL   MCH 30.2 26.0 - 34.0 pg   MCHC 33.2 30.0 - 36.0 g/dL   RDW 18.0 (H) 11.5 - 15.5 %   Platelets 111 (L) 150 - 400 K/uL   Neutrophils Relative % 40 %   Lymphocytes Relative 17 %   Monocytes  Relative 40 %   Eosinophils Relative 2 %   Basophils Relative 1 %   Neutro Abs 0.6 (L) 1.7 - 7.7 K/uL   Lymphs Abs 0.3 (L) 0.7 - 4.0 K/uL   Monocytes Absolute 0.6 0.1 - 1.0 K/uL   Eosinophils Absolute 0.0 0.0 - 0.7 K/uL   Basophils Absolute 0.0 0.0 - 0.1 K/uL   RBC Morphology POLYCHROMASIA PRESENT   I-Stat CG4 Lactic Acid, ED  (not at  [facility])     Status: Abnormal   Collection Time: 06/05/15 12:18 AM  Result Value Ref Range   Lactic Acid, Venous 2.07 (HH) 0.5 - 2.0 mmol/L   Comment NOTIFIED PHYSICIAN   Protime-INR     Status: Abnormal   Collection Time: 06/05/15  2:38 AM  Result Value Ref Range   Prothrombin Time 17.3 (H) 11.6 - 15.2 seconds   INR 1.40 0.00 - 1.49  I-Stat CG4 Lactic Acid, ED  (not at  [facility])     Status: Abnormal   Collection Time: 06/05/15  2:44 AM  Result Value Ref Range   Lactic Acid, Venous 2.52 (HH) 0.5 - 2.0 mmol/L   Comment NOTIFIED PHYSICIAN   Comprehensive metabolic panel     Status: Abnormal   Collection Time: 06/05/15  3:32 AM  Result Value Ref Range   Sodium 131 (L) 135 - 145 mmol/L   Potassium 7.2 (HH) 3.5 - 5.1 mmol/L   Chloride 96 (L) 101 - 111 mmol/L   CO2 14 (L) 22 - 32 mmol/L   Glucose, Bld 52 (L) 65 - 99 mg/dL   BUN 181 (H) 6 - 20 mg/dL   Creatinine, Ser 13.47 (H) 0.61 - 1.24 mg/dL   Calcium 8.4 (L) 8.9 - 10.3 mg/dL   Total Protein 4.4 (L) 6.5 - 8.1 g/dL   Albumin 1.3 (L) 3.5 - 5.0 g/dL   AST 25 15 - 41 U/L   ALT 15 (L) 17 - 63 U/L   Alkaline Phosphatase 70 38 - 126 U/L   Total Bilirubin 0.8 0.3 - 1.2 mg/dL   GFR calc non Af Amer 3 (L) >60 mL/min   GFR calc Af Amer 4 (L) >60 mL/min   Anion gap 21 (H) 5 - 15  CBC     Status: Abnormal   Collection Time: 06/05/15  3:32 AM  Result Value Ref Range   WBC  2.5 (L) 4.0 - 10.5 K/uL   RBC 3.12 (L) 4.22 - 5.81 MIL/uL   Hemoglobin 9.1 (L) 13.0 - 17.0 g/dL   HCT 28.5 (L) 39.0 - 52.0 %   MCV 91.3 78.0 - 100.0 fL   MCH 29.2 26.0 - 34.0 pg   MCHC 31.9 30.0 - 36.0 g/dL   RDW 17.9 (H) 11.5 - 15.5 %     Platelets 143 (L) 150 - 400 K/uL  Glucose, capillary     Status: Abnormal   Collection Time: 06/05/15 11:24 AM  Result Value Ref Range   Glucose-Capillary 47 (L) 65 - 99 mg/dL    Micro Results: No results found for this or any previous visit (from the past 240 hour(s)). Studies/Results: US Paracentesis  06/04/2015  INDICATION: Ascites EXAM: ULTRASOUND-GUIDED PARACENTESIS COMPARISON:  None. MEDICATIONS: 10 cc 1% lidocaine COMPLICATIONS: None immediate. TECHNIQUE: Informed written consent was obtained from the patient after a discussion of the risks, benefits and alternatives to treatment. A timeout was performed prior to the initiation of the procedure. Initial ultrasound scanning demonstrates a large amount of ascites within the right lower abdominal quadrant. The right lower abdomen was prepped and draped in the usual sterile fashion. 1% lidocaine with epinephrine was used for local anesthesia. Under direct ultrasound guidance, a 19 gauge, 7-cm, Yueh catheter was introduced. An ultrasound image was saved for documentation purposed. The paracentesis was performed. The catheter was removed and a dressing was applied. The patient tolerated the procedure well without immediate post procedural complication. FINDINGS: A total of approximately 5.6 liters of yellow fluid was removed. IMPRESSION: Successful ultrasound-guided paracentesis yielding 5.6 liters of peritoneal fluid. Read by:  Lavonia Drafts [facility] Electronically Signed   By: Jerilynn Mages.  Shick M.D.   On: 06/04/2015 14:35   Dg Chest Port 1 View  06/05/2015  CLINICAL DATA:  Weakness and fever. EXAM: PORTABLE CHEST 1 VIEW COMPARISON:  05/19/2015 FINDINGS: A single AP portable view of the chest demonstrates no focal airspace consolidation or alveolar edema. The lungs are grossly clear. There is no large effusion or pneumothorax. There is unchanged cardiomegaly. Cardiac and mediastinal contours are otherwise unremarkable. IMPRESSION: Stable cardiomegaly.  No  acute cardiopulmonary findings. Electronically Signed   By: Andreas Newport M.D.   On: 06/05/2015 00:02   Medications:  Scheduled Meds: . feeding supplement (NEPRO CARB STEADY)  237 mL Oral TID BM  . heparin  5,000 Units Subcutaneous Q8H  . hydrocortisone cream   Topical QID  . pantoprazole  40 mg Oral BID  . piperacillin-tazobactam (ZOSYN)  IV  2.25 g Intravenous Q8H  . vancomycin  750 mg Intravenous Q T,Th,Sa-HD   Continuous Infusions:  PRN Meds:.sodium chloride, sodium chloride, alteplase, fentaNYL (SUBLIMAZE) injection, heparin, lidocaine (PF), lidocaine-prilocaine, ondansetron, pentafluoroprop-tetrafluoroeth, polyethylene glycol   Assessment & Plan by Problem:  Transient lower extremity Weakness: Clinically resolving this morning. Presented with bilateral LE weakness following therapeutic paracentesis of 5.6 L. Etiology unclear, but potentially related to hypotension or fluid shifts following large-volume paracentesis. Initially concerning for sepsis due to severe leukopenia and low grade fever, however repeat CBC showed WBC trending to his baseline, no more fever, and he is clinically improved this morning. He is on ESRD and also has poorly characterized portal hypertension plus frequent steroid treatment for GPA. CXR negative. No focal symptoms or confusion. There was an abscess with overlying cellulitis on his back that was instrumented by his brother and subsequently drained in the ED which could be a source of infection. Overall he  is a high risk of complication or decompensation due to his underlying disease states. Given his spontaneous improvement from presentation, we will continue with supportive management.  -Vancomycin (5/9- ), Zosyn (5/9- ) -F/U blood Cx -Regular diet  -Gentle IV resuscitation if needed for ESRD pt  -Stepdown unit for high risk of complications  Hyperkalemia 2/2 ESRD on HD: K>7.5 on presentation. Partial T wave changes on EKG. His story as to HD adherence  and date of last session is inconsistent. Per Nephrology service note, last session on 5/2, which likely explains his degree of uremia, hyperkalemia, hypervolemia. Calcium gluconate, insulin, kayexalate given in ED. Nephrology consulted for evaluation and plan urgent HD. Hyperemic to Hgb 17. AM labs continue to show hyperkalemia to 7.2, and he underwent HD this morning.  -Cardiac telemetry -Repeat AM CMet to f/u dialysis -Nephrology on board, recs appreciated  Back abscess with cellulitis:  Possible infectious source as mentioned above, reportedly present for 3 wks PTA. S/p lancing by his brother at home and then I&D in the ED. Overlying skin inflamed and TTP.  -Abtx as above -Fentanyl 76mcg q2hrs PRN  Ascites: Portal hypertension of unclear etiology.. Paracentesis was 5/8 precipitating his current symptoms. Was planning for TJ biopsy coming up. -Consider diagnostic repeat paracentesis tmrw if clinically warranted  Granulomatosis with polyangiitis: Currently no complaints of rash. Has appt with Dr. Amil Amen 5/24. Home hydrocortisone cream PRN.  Esophagitis: Stable. Continue home protonix. Constipation: Stable. Continue home miralax PRN.  PPx: Heparin Code: Full  Dispo: Disposition is deferred at this time, awaiting improvement of current medical problems. Anticipated discharge in approximately 2-3 day(s).   The patient does not have a current PCP (No primary care provider on file.) and does need an [facility] hospital follow-up appointment after discharge.  This is a Careers information officer Note.  The care of the patient was discussed with Dr. Beryle Beams and the assessment and plan formulated with their assistance.  Please see their attached note for official documentation of the daily encounter.   LOS: 0 days   Konrad Penta, Med Student 06/05/2015, 11:57 AM

## 2015-06-05 NOTE — H&P (Signed)
Date: 06/05/2015               Patient Name:  Alexander Grant. MRN: [id number]  DOB: [birth year] Age / Sex: 60 y.o., male   PCP: No primary care provider on file.         Medical Service: Internal Medicine Teaching Service         Attending Physician: Dr. Annia Belt, MD    First Contact: Dene Gentry, MS IV Pager: [phone]  Second Contact: Dr. Randell Patient Pager: [phone]       After Hours (After 5p/  First Contact Pager: [phone]  weekends / holidays): Second Contact Pager: [phone]   Chief Complaint: Weakness  History of Present Illness: 60 y/o man with PMHx significant for ESRD on HD T,T,S, granulomatosus with polyangiitis, esophagitis, cocaine abuse, fournier gangrene, and recently ascites with poorly understood etiology presents to the ED for weakness after undergoing a 5.6L paracentesis at IR today. He was feeling fine until after his procedure he developed severe weakness of his legs preventing him from standing back up. He has had this sensation after paracentesis before but never to the extent he cannot walk easily. He denies any dizziness, vision changes, or shortness of breath. He has no headache or confusion. He denies any significant abdominal pain. However he also reports a boil on his back that has been bothering him for about 3 weeks. He reports purulent drainage when brother cut this open for him earlier Monday after "he sterilized it". A 3x3cm nodule with the surrounding area indurated and inflamed was noted by the EDP who repeated I&D with minimal further purulence expressed. He denies having previous skin wounds similar to this one.  While in the ED labs were significant for a potassium of >7.5 prompting Nephrology consultation for possible urgent hemodialysis. Other chemistry abnormal consistent with his ESRD several days since HD. CBC showing WBC 1.5 with 0.6 ANC and 111 plts.  He was previously admitted on 4/22 for abdominal pain found to have ascites and  due to his ESRD plan for weekly paracentesis to limit fluid reaccumulation. He was seen in clinic on 5/3 where he had moderate lower back pain, was seen by Dr. Havery Moros in clinic who plans for TJ liver biopsy.  The cause of his ascites was not clearly identified with a mixed picture of low SAAG however imaging studies demonstrating portal hypertension. He reports attending his HD sessions without complication.  Meds: Current Facility-Administered Medications  Medication Dose Route Frequency Provider Last Rate Last Dose  . 0.9 %  sodium chloride infusion  1,000 mL Intravenous Continuous Orpah Greek, MD   Stopped at 06/05/15 [phone]  . feeding supplement (NEPRO CARB STEADY) liquid 237 mL  237 mL Oral TID BM Collier Salina, MD      . fentaNYL (SUBLIMAZE) injection 50 mcg  50 mcg Intravenous Q2H PRN Collier Salina, MD      . heparin injection 5,000 Units  5,000 Units Subcutaneous Q8H Collier Salina, MD      . hydrocortisone cream 1 %   Topical QID Collier Salina, MD      . ondansetron [facility]) tablet 4 mg  4 mg Oral Q6H PRN Collier Salina, MD      . pantoprazole (PROTONIX) EC tablet 40 mg  40 mg Oral BID Collier Salina, MD      . piperacillin-tazobactam (ZOSYN) IVPB 2.25 g  2.25 g Intravenous Q8H Veronda P Bryk, RPH      .  polyethylene glycol (MIRALAX / GLYCOLAX) packet 17 g  17 g Oral Daily PRN Collier Salina, MD      . vancomycin (VANCOCIN) 500 mg in sodium chloride 0.9 % 100 mL IVPB  500 mg Intravenous Once [employer], RPH      . vancomycin (VANCOCIN) IVPB 750 mg/150 ml premix  750 mg Intravenous Q T,Th,Sa-HD Laren Everts, RPH        Allergies: Allergies as of 06/04/2015 - Review Complete 06/04/2015  Allergen Reaction Noted  . Bee pollen Anaphylaxis, Shortness Of Breath, and Rash 10/01/2011  . Bee venom Anaphylaxis, Shortness Of Breath, and Rash 08/01/2013  . Percocet [oxycodone-acetaminophen] Hives and Rash 09/01/2011   Past Medical History    Diagnosis Date  . Fournier gangrene     with peri-rectal/perineal abscess drainage  . PONV (postoperative nausea and vomiting)   . Wegener's granulomatosis with renal involvement ([place])   . Anemia   . History of blood transfusion 08/2011  . Lower GI bleeding 10/01/2011  . Cocaine abuse 2013  . Tubular adenoma of colon   . Hyponatremia   . Acute renal failure ([place])   . Pulmonary infiltrate   . Reflux esophagitis   . Diverticulosis   . Right thyroid nodule   . Cardiomegaly   . Renal disorder     Dialysis  . Kidney stone   . Hypertension   . Upper GI bleed 04/2015   Past Surgical History  Procedure Laterality Date  . Esophagogastroduodenoscopy  09/10/2011    Procedure: ESOPHAGOGASTRODUODENOSCOPY (EGD);  Surgeon: Jerene Bears, MD;  Location: [place];  Service: Gastroenterology;  Laterality: N/A;  . Colonoscopy  09/11/2011    Procedure: COLONOSCOPY;  Surgeon: Jerene Bears, MD;  Location: [place];  Service: Gastroenterology;  Laterality: N/A;  . Knee arthroscopy w/ medial collateral ligament (mcl) repair  ~ 2000    left  . Knee arthroscopy w/ acl reconstruction  2002  . Shoulder open rotator cuff repair   09/1999    right  . Incision and drainage perirectal abscess  05/2008    'for Fournier's gangrene"  . Kidney stone surgery  1990  . Vasectomy    . Fracture surgery Right     wrist and great toe  . Av fistula placement Left 02/07/2013    Procedure: ARTERIOVENOUS (AV) FISTULA CREATION- LEFT BRACHIOCEPHALIC ;  Surgeon: Rosetta Posner, MD;  Location: [place];  Service: Vascular;  Laterality: Left;  . Insertion of dialysis catheter N/A 02/07/2013    Procedure: INSERTION OF DIALYSIS CATHETER;  Surgeon: Rosetta Posner, MD;  Location: [place];  Service: Vascular;  Laterality: N/A;  . Anterior cruciate ligament repair    . Shoulder surgery Left   . Esophagogastroduodenoscopy N/A 05/09/2015    Procedure: ESOPHAGOGASTRODUODENOSCOPY (EGD);  Surgeon: Manus Gunning, MD;  Location: [place];  Service: Gastroenterology;  Laterality: N/A;   Family History  Problem Relation Age of Onset  . Stroke Mother   . Cancer Father    Social History   Social History  . Marital Status: Divorced    Spouse Name: N/A  . Number of Children: N/A  . Years of Education: N/A   Occupational History  . Not on file.   Social History Main Topics  . Smoking status: Never Smoker   . Smokeless tobacco: Never Used  . Alcohol Use: No     Comment: 10/01/2011 "last alcohol ~ 2012"  . Drug Use: No     Comment: as  recent as 09/02/11 per toxicology screen at hospital  . Sexual Activity: Not Currently   Other Topics Concern  . Not on file   Social History Narrative   ** Merged History Encounter **       Review of Systems: Review of Systems  Constitutional: Negative for fever.  Eyes: Negative for blurred vision.  Respiratory: Negative for shortness of breath.   Cardiovascular: Positive for leg swelling. Negative for chest pain.  Gastrointestinal: Negative for abdominal pain.  Genitourinary: Negative for dysuria.  Musculoskeletal: Positive for back pain.  Skin: Negative for rash.  Neurological: Positive for focal weakness and weakness. Negative for dizziness and headaches.  Endo/Heme/Allergies: Negative for polydipsia.  Psychiatric/Behavioral: The patient is not nervous/anxious.    Physical Exam: Blood pressure 125/67, pulse 89, temperature 100.1 F (37.8 C), temperature source Rectal, resp. rate 24, height 5\' 9"  (1.753 m), weight 72.576 kg (160 lb), SpO2 100 %.  GENERAL- Very fatigued, alert and oriented, NAD HEENT- Moist oral mucosa, PERRL, EOMI, Poor dentition with several caries, no cervical LAN CARDIAC- RRR, 2/6 systolic murmur RESP- CTAB, no wheezes or crackles. ABDOMEN- Soft, nontender, no guarding or rebound, no prominent tympany or dullness BACK- R mid back area of erythema and induration s/p drainage of central 2x2cm area, with continued small bloody drainage, mildly  TTP NEURO- No obvious Cr N abnormality, strength upper and lower extremities- 5/5, Sensation intact globally EXTREMITIES- symmetric, 1+ pedal edema b/l, LUE HD access with palpable thrill SKIN- Erythematous region as above, warm and dry, no rashes on legs PSYCH- Tired, appropriate mood, appropriate thought content and speech.   Lab results: Basic Metabolic Panel:  Recent Labs  06/04/15 2337  NA 130*  K >7.5*  CL 91*  CO2 18*  GLUCOSE 89  BUN 178*  CREATININE 14.24*  CALCIUM 9.3   Liver Function Tests:  Recent Labs  06/04/15 2337  AST 19  ALT 19  ALKPHOS 89  BILITOT 1.1  PROT 5.7*  ALBUMIN 1.7*   CBC:  Recent Labs  06/04/15 2337 06/05/15 0332  WBC 1.5* 2.5*  NEUTROABS 0.6*  --   HGB 17.1* 9.1*  HCT 51.5 28.5*  MCV 91.0 91.3  PLT 111* 143*  Urinalysis:  Recent Labs  06/04/15 2321  COLORURINE YELLOW  LABSPEC 1.013  PHURINE 7.5  GLUCOSEU NEGATIVE  HGBUR MODERATE*  BILIRUBINUR NEGATIVE  KETONESUR NEGATIVE  PROTEINUR 30*  NITRITE NEGATIVE  LEUKOCYTESUR NEGATIVE   Imaging results:  US Paracentesis  06/04/2015  INDICATION: Ascites EXAM: ULTRASOUND-GUIDED PARACENTESIS COMPARISON:  None. MEDICATIONS: 10 cc 1% lidocaine COMPLICATIONS: None immediate. TECHNIQUE: Informed written consent was obtained from the patient after a discussion of the risks, benefits and alternatives to treatment. A timeout was performed prior to the initiation of the procedure. Initial ultrasound scanning demonstrates a large amount of ascites within the right lower abdominal quadrant. The right lower abdomen was prepped and draped in the usual sterile fashion. 1% lidocaine with epinephrine was used for local anesthesia. Under direct ultrasound guidance, a 19 gauge, 7-cm, Yueh catheter was introduced. An ultrasound image was saved for documentation purposed. The paracentesis was performed. The catheter was removed and a dressing was applied. The patient tolerated the procedure well without  immediate post procedural complication. FINDINGS: A total of approximately 5.6 liters of yellow fluid was removed. IMPRESSION: Successful ultrasound-guided paracentesis yielding 5.6 liters of peritoneal fluid. Read by:  Lavonia Drafts [facility] Electronically Signed   By: Jerilynn Mages.  Shick M.D.   On: 06/04/2015 14:35   Dg  Chest Port 1 View  06/05/2015  CLINICAL DATA:  Weakness and fever. EXAM: PORTABLE CHEST 1 VIEW COMPARISON:  05/19/2015 FINDINGS: A single AP portable view of the chest demonstrates no focal airspace consolidation or alveolar edema. The lungs are grossly clear. There is no large effusion or pneumothorax. There is unchanged cardiomegaly. Cardiac and mediastinal contours are otherwise unremarkable. IMPRESSION: Stable cardiomegaly.  No acute cardiopulmonary findings. Electronically Signed   By: Andreas Newport M.D.   On: 06/05/2015 00:02   Other results: EKG: Sinus rhythm, no QT prolongation, PAC  I personally reviewed the patient's records and plans of care including but not limited to his recent hospitalization and workup, his clinic visit with Dr. Marijean Bravo, and his GI clinic visit with GI Dr. Havery Moros.  Assessment & Plan by Problem: Weakness: Concerning for sepsis due to leukopenia, low grade fever in this immunocompromised patient. He is on ESRD and has poorly characterized portal hypertension plus frequent steroid treatment with GPA. CXR negative. No focal symptoms or confusion. There is a cellulitis with probable abscess that was instrumented by his brother that could be a source of infection. He just had a paracentesis and fluid shift may also account in large part for the onset of his weakness. Overall he is a high risk of complication or decompensation due to his underlying disease states. -Vancomycin 5/9>> -Zosyn 5/9>> -F/U blood Cx -Repeat CBC in AM -NPO for now -Gentle IV resuscitation if needed for ESRD pt  -Stepdown unit for high risk of complications  Hyperkalemia 2/2 ESRD on HD:  K>7.5 on presentation. He reports completing HD on Thursday without incident so not entirely clear why it is this high already. Calcium gluconate, insulin, kayexalate given in ED. Partial T wave changes on EKG. Nephrology consulted for evaluation and plan urgent HD. Hyperemic Hgb 17. Per Nephrology service note review of outpt HD charts last session on 5/2. Explains his degree of uremia, hyperkalemia, hypervolemia. -Cardiac telemetry -F/U AM Cmet -Agree with temporizing treatments in ED -Nephrology on board, recs appreciated  Back cellulitis: As mentioned above as possible infectious source. S/p I&D in the ED but was lanced by his brother at home. Does look very inflamed and reportedly present for 3 wks PTA. -Abtx as above -Fentanyl 48mcg q2hrs PRN  Ascites: Portal hypertension of unclear etiology.. Paracentesis was 5/8 precipitating his current symptoms. Was planning for TJ biopsy coming up. -Consider diagnostic repeat paracentesis tmrw if clinically warranted  Granulomatosis with polyangiitis: Currently no complaints of rash. Has appt with Dr. Amil Amen 5/24. Home hydrocortisone cream PRN.  Esophagitis: Stable. Continue home protonix. Constipation: Stable. Continue home miralax PRN.  Dispo: Disposition is deferred at this time, awaiting improvement of current medical problems. Anticipated discharge in approximately 2-3 day(s).   The patient does not know have a current PCP (No primary care provider on file.) and does need an [facility] hospital follow-up appointment after discharge.  Signed: Collier Salina, MD 06/05/2015, 4:23 AM

## 2015-06-05 NOTE — Procedures (Signed)
I have seen and examined this patient and agree with the plan of care.  Patient was seen on dialysis   Hypotension and lower extremity edema   Emergent treatment for hyperkalemia   Alexander Grant 06/05/2015, 9:12 AM

## 2015-06-05 NOTE — Progress Notes (Signed)
Blood glucose rechecked, 117.

## 2015-06-05 NOTE — Progress Notes (Signed)
Pt's blood glucose from labwork at 3am was 52. RN checked pt's blood glucose after arrival from HD and sugar was 47. Dextrose 50% administered. Will continue to monitor.

## 2015-06-05 NOTE — Progress Notes (Signed)
CRITICAL VALUE ALERT  Critical value received:  Potassium 7.2  Date of notification:  06/05/2015   Time of notification:  4:38 AM   Critical value read back:Yes.    Nurse who received alert:  Monico Hoar, RN   MD notified (1st page):  IMTS MD  Time of first page:  4:38 AM   MD notified (2nd page):  Time of second page:  Responding MD:  IMTS MD  Time MD responded:  4:38 AM

## 2015-06-06 ENCOUNTER — Other Ambulatory Visit: Payer: Self-pay | Admitting: Radiology

## 2015-06-06 DIAGNOSIS — L03312 Cellulitis of back [any part except buttock]: Secondary | ICD-10-CM

## 2015-06-06 DIAGNOSIS — E875 Hyperkalemia: Secondary | ICD-10-CM

## 2015-06-06 DIAGNOSIS — R188 Other ascites: Secondary | ICD-10-CM

## 2015-06-06 DIAGNOSIS — Z992 Dependence on renal dialysis: Secondary | ICD-10-CM

## 2015-06-06 DIAGNOSIS — R531 Weakness: Secondary | ICD-10-CM

## 2015-06-06 DIAGNOSIS — N186 End stage renal disease: Secondary | ICD-10-CM

## 2015-06-06 DIAGNOSIS — L02212 Cutaneous abscess of back [any part, except buttock]: Principal | ICD-10-CM

## 2015-06-06 DIAGNOSIS — B9689 Other specified bacterial agents as the cause of diseases classified elsewhere: Secondary | ICD-10-CM

## 2015-06-06 LAB — CBC WITH DIFFERENTIAL/PLATELET
Basophils Absolute: 0 10*3/uL (ref 0.0–0.1)
Basophils Relative: 0 %
Eosinophils Absolute: 0.4 10*3/uL (ref 0.0–0.7)
Eosinophils Relative: 7 %
HEMATOCRIT: 30.5 % — AB (ref 39.0–52.0)
Hemoglobin: 9.8 g/dL — ABNORMAL LOW (ref 13.0–17.0)
LYMPHS PCT: 9 %
Lymphs Abs: 0.5 10*3/uL — ABNORMAL LOW (ref 0.7–4.0)
MCH: 29.6 pg (ref 26.0–34.0)
MCHC: 32.1 g/dL (ref 30.0–36.0)
MCV: 92.1 fL (ref 78.0–100.0)
MONO ABS: 1.2 10*3/uL — AB (ref 0.1–1.0)
MONOS PCT: 24 %
NEUTROS ABS: 3 10*3/uL (ref 1.7–7.7)
Neutrophils Relative %: 60 %
Platelets: 169 10*3/uL (ref 150–400)
RBC: 3.31 MIL/uL — ABNORMAL LOW (ref 4.22–5.81)
RDW: 17.8 % — AB (ref 11.5–15.5)
WBC: 5.1 10*3/uL (ref 4.0–10.5)

## 2015-06-06 LAB — RENAL FUNCTION PANEL
ANION GAP: 18 — AB (ref 5–15)
Albumin: 1.2 g/dL — ABNORMAL LOW (ref 3.5–5.0)
BUN: 83 mg/dL — ABNORMAL HIGH (ref 6–20)
CO2: 21 mmol/L — AB (ref 22–32)
Calcium: 8.4 mg/dL — ABNORMAL LOW (ref 8.9–10.3)
Chloride: 98 mmol/L — ABNORMAL LOW (ref 101–111)
Creatinine, Ser: 8.29 mg/dL — ABNORMAL HIGH (ref 0.61–1.24)
GFR calc Af Amer: 7 mL/min — ABNORMAL LOW (ref >60)
GFR calc non Af Amer: 6 mL/min — ABNORMAL LOW (ref >60)
GLUCOSE: 138 mg/dL — AB (ref 65–99)
POTASSIUM: 4.1 mmol/L (ref 3.5–5.1)
Phosphorus: 8 mg/dL — ABNORMAL HIGH (ref 2.5–4.6)
Sodium: 137 mmol/L (ref 135–145)

## 2015-06-06 LAB — GLUCOSE, CAPILLARY
Glucose-Capillary: 98 mg/dL (ref 65–99)
Glucose-Capillary: 128 mg/dL — ABNORMAL HIGH (ref 65–99)

## 2015-06-06 MED ORDER — DOXYCYCLINE HYCLATE 100 MG PO TABS: 100.0000 mg | ORAL_TABLET | Freq: Two times a day (BID) | ORAL | Status: DC

## 2015-06-06 MED ORDER — DOXYCYCLINE HYCLATE 100 MG PO TABS: 100.0000 mg | ORAL_TABLET | Freq: Two times a day (BID) | ORAL | Status: AC

## 2015-06-06 MED ORDER — LEVOFLOXACIN 500 MG PO TABS: 500.0000 mg | ORAL_TABLET | ORAL | Status: AC

## 2015-06-06 MED ORDER — LEVOFLOXACIN 500 MG PO TABS: 500.0000 mg | ORAL_TABLET | ORAL | Status: DC

## 2015-06-06 NOTE — Progress Notes (Signed)
Subjective: Alexander Grant continues to feel well this morning, reporting that he walked with assistance several times yesterday and could walk by himself if allowed. He reports his LE strength ~75% of his baseline. The pain/pressure from his back abscess is stable from yesterday. He has not noted any fevers or chills. He feels ready to go home today.   Objective: Vital signs in last 24 hours: Filed Vitals:   06/05/15 2355 06/06/15 0200 06/06/15 0404 06/06/15 0844  BP: 106/63 104/73 114/68 97/70  Pulse: 78 74 80 87  Temp: 97.6 F (36.4 C)  97 F (36.1 C) 97.4 F (36.3 C)  TempSrc: Oral  Oral Oral  Resp: 19 20 16 24   Height:      Weight:      SpO2: 93%  95% 100%   Weight change: -3.276 kg (-7 lb 3.5 oz)  Intake/Output Summary (Last 24 hours) at 06/06/15 0854 Last data filed at 06/06/15 0035  Gross per 24 hour  Intake    750 ml  Output   2300 ml  Net  -1550 ml   Physical Exam: GENERAL- Lying in bed in NAD HEENT- Moist oral mucosa, EOMI. Sclera anicteric.  CARDIAC- RRR, with normal S1 and S2. Admission exam noted 2/6 systolic murmur - I did not appreciate this on auscultation. RESP- CTAB, no wheezes or crackles. SORA. ABDOMEN- Normoactive bowel sounds. Soft, non-tender, slightly more distended than yesterday. No prominent tympany or dullness. No guarding or rebound. No organomegaly or masses.  BACK- R mid back area of mild erythema overlying central 2x2cm area of fluctuance s/p I&D, with continued small sanguinous drainage onto dressing. Very mildly TTP NEURO- No obvious Cr N abnormality, strength upper and lower extremities- 5/5, Sensation intact globally EXTREMITIES- symmetric, 1+ pedal edema b/l, LUE HD access with palpable thrill SKIN- Erythematous region as above, warm and dry, no rashes on legs PSYCH- Appropriate mood, appropriate thought content and speech.   Lab Results: CBC Latest Ref Rng 06/06/2015 06/05/2015 06/04/2015  WBC 4.0 - 10.5 K/uL 5.1 2.5(L) 1.5(L)  Hemoglobin  13.0 - 17.0 g/dL 9.8(L) 9.1(L) 17.1(H)  Hematocrit 39.0 - 52.0 % 30.5(L) 28.5(L) 51.5  Platelets 150 - 400 K/uL 169 143(L) 111(L)   CMP Latest Ref Rng 06/06/2015 06/05/2015 06/04/2015  Glucose 65 - 99 mg/dL 138(H) 52(L) 89  BUN 6 - 20 mg/dL 83(H) 181(H) 178(H)  Creatinine 0.61 - 1.24 mg/dL 8.29(H) 13.47(H) 14.24(H)  Sodium 135 - 145 mmol/L 137 131(L) 130(L)  Potassium 3.5 - 5.1 mmol/L 4.1 7.2(HH) >7.5(HH)  Chloride 101 - 111 mmol/L 98(L) 96(L) 91(L)  CO2 22 - 32 mmol/L 21(L) 14(L) 18(L)  Calcium 8.9 - 10.3 mg/dL 8.4(L) 8.4(L) 9.3  Total Protein 6.5 - 8.1 g/dL - 4.4(L) 5.7(L)  Total Bilirubin 0.3 - 1.2 mg/dL - 0.8 1.1  Alkaline Phos 38 - 126 U/L - 70 89  AST 15 - 41 U/L - 25 19  ALT 17 - 63 U/L - 15(L) 19   Recent Labs     06/04/15  2337  06/05/15  0332  06/06/15  0431  CALCIUM  9.3  8.4*  8.4*   Recent Labs     06/06/15  0431  PHOS  8.0*    Micro Results: No results found for this or any previous visit (from the past 240 hour(s)). Studies/Results: US Paracentesis  06/04/2015  INDICATION: Ascites EXAM: ULTRASOUND-GUIDED PARACENTESIS COMPARISON:  None. MEDICATIONS: 10 cc 1% lidocaine COMPLICATIONS: None immediate. TECHNIQUE: Informed written consent was obtained from the patient after a  discussion of the risks, benefits and alternatives to treatment. A timeout was performed prior to the initiation of the procedure. Initial ultrasound scanning demonstrates a large amount of ascites within the right lower abdominal quadrant. The right lower abdomen was prepped and draped in the usual sterile fashion. 1% lidocaine with epinephrine was used for local anesthesia. Under direct ultrasound guidance, a 19 gauge, 7-cm, Yueh catheter was introduced. An ultrasound image was saved for documentation purposed. The paracentesis was performed. The catheter was removed and a dressing was applied. The patient tolerated the procedure well without immediate post procedural complication. FINDINGS: A total of  approximately 5.6 liters of yellow fluid was removed. IMPRESSION: Successful ultrasound-guided paracentesis yielding 5.6 liters of peritoneal fluid. Read by:  Lavonia Drafts [facility] Electronically Signed   By: Jerilynn Mages.  Shick M.D.   On: 06/04/2015 14:35   Dg Chest Port 1 View  06/05/2015  CLINICAL DATA:  Weakness and fever. EXAM: PORTABLE CHEST 1 VIEW COMPARISON:  05/19/2015 FINDINGS: A single AP portable view of the chest demonstrates no focal airspace consolidation or alveolar edema. The lungs are grossly clear. There is no large effusion or pneumothorax. There is unchanged cardiomegaly. Cardiac and mediastinal contours are otherwise unremarkable. IMPRESSION: Stable cardiomegaly.  No acute cardiopulmonary findings. Electronically Signed   By: Andreas Newport M.D.   On: 06/05/2015 00:02   Medications:  Scheduled Meds: . feeding supplement (NEPRO CARB STEADY)  237 mL Oral TID BM  . heparin  5,000 Units Subcutaneous Q8H  . hydrocortisone cream   Topical QID  . pantoprazole  40 mg Oral BID  . piperacillin-tazobactam (ZOSYN)  IV  2.25 g Intravenous Q8H  . vancomycin  750 mg Intravenous Q T,Th,Sa-HD   Continuous Infusions:  PRN Meds:.sodium chloride, sodium chloride, alteplase, fentaNYL (SUBLIMAZE) injection, heparin, lidocaine (PF), lidocaine-prilocaine, ondansetron, pentafluoroprop-tetrafluoroeth, polyethylene glycol   Assessment/Plan: Transient lower extremity Weakness: Clinically resolving. Presented with bilateral LE weakness following therapeutic paracentesis of 5.6 L. Etiology unclear, but potentially related to hypotension and/or fluid shifts following large-volume paracentesis. Initial concern for sepsis due to severe leukopenia and low grade fever, however repeat CBC showed WBC trending to his baseline. He has not had any more fevers since admission, and he clinically improved the first night. He is on ESRD and also has poorly characterized portal hypertension plus frequent steroid treatment for  GPA. CXR negative. No focal symptoms or confusion. There was an abscess with overlying cellulitis on his back that was instrumented by his brother and subsequently drained in the ED which could be a source of infection. Overall he is a high risk of complication or decompensation due to his underlying disease states. Given his spontaneous improvement from presentation, we will continue with supportive management.  -D/c Vancomycin (5/9-5/10) and Zosyn (5/9-5/10) -->Start PO Doxy and Levaquin -F/U blood Cx -Regular diet  -Gentle IV resuscitation if needed for ESRD pt  -Stepdown unit for high risk of complications  Hyperkalemia 2/2 ESRD on HD: K>7.5 on presentation. Partial T wave changes on EKG. His story as to HD adherence and date of last session is inconsistent. Per Nephrology service note, last session on 5/2, which likely explains his degree of uremia, hyperkalemia, hypervolemia. Calcium gluconate, insulin, kayexalate given in ED. Nephrology consulted for evaluation and did urgent HD 5/9. Post-dialysis labwork shows improvement of Na to 139 from 131 and K to 4.5 from 7.2.  -Cardiac telemetry -Nephrology following, recs appreciated  Back abscess with cellulitis: Possible infectious source as mentioned above, reportedly present for 3  wks PTA. S/p lancing by his brother at home and then I&D in the ED. Overlying skin inflamed and TTP.  -Switch abx to Doxy and Levaquin PO today  -Fentanyl 24mcg q2hrs PRN  Ascites: Portal hypertension of unclear etiology.. Paracentesis was 5/8 precipitating his current symptoms. Has TJ biopsy scheduled for tomorrow afternoon (5/11) at [facility] with polyangiitis: Currently no complaints of rash. Has appt with Dr. Amil Amen 5/24. Home hydrocortisone cream PRN.  Esophagitis: Stable. Continue home protonix. Constipation: Stable. Continue home miralax PRN.  PPx: Heparin Code: Full  Dispo: Disposition is deferred at this time, awaiting improvement  of current medical problems. Anticipated discharge in approximately 2-3 day(s).   The patient does not have a current PCP (No primary care provider on file.) and does need an [facility] hospital follow-up appointment after discharge.  This is a Careers information officer Note.  The care of the patient was discussed with Dr. Beryle Beams and the assessment and plan formulated with their assistance.  Please see their attached note for official documentation of the daily encounter.   LOS: 1 day   Konrad Penta, Med Student 06/06/2015, 8:54 AM

## 2015-06-06 NOTE — Discharge Instructions (Addendum)
You were admitted for weakness in your legs and concerning lab values. In order to feel well and stay out of the hospital, it is extremely important for you to make it to your regularly scheduled Dialysis sessions. You also have an infection on your back, for which we started you on two antibiotics in the hospital. You will continue taking these as a tablet when you go home.   START taking the following medications:  -Doxycycline 1 tablet twice per day -Levaquin 1 tablet every other day  You should follow-up with your primary care physician. You have a hospital follow-up visit scheduled with Dr. Marijean Bravo in clinic on May 24th at 10:45am.

## 2015-06-06 NOTE — Clinical Documentation Improvement (Signed)
Internal Medicine  Please clarify the diagnosis of Sepsis. Is Sepsis present, and if so, was Sepsis present on admission?    Sepsis - specify causative organism if known  Determine if there is Severe Sepsis (Sepsis with organ dysfunction - specify), Septic Shock if present  Identify etiology of Sepsis - Device, Implant, Graft, Infusion, Abortion  Specify organ dysfunction - Respiratory Failure, Encephalopathy, Acute Kidney Failure, Pneumonia, UTI, Other (specify), Unable to Clinically Determine}  Other  Clinically Undetermined  Document any associated diagnoses/conditions.   Supporting Information: Patient has not been hypotensive, tachycardic, and his lactate was just over 2, did not require Sepsis protocol fluids per 5/09 ED Provider note. Weakness concerning for Sepsis due to leukopenia, low grade fever, in this immunocompromised patient. Vancomycin per pharmacy protocol per 5/09 pharmacy note. Lactic acid: 5/09: 2.52;  2.07.   Please exercise your independent, professional judgment when responding. A specific answer is not anticipated or expected.   Thank You,  [place] [phone]

## 2015-06-06 NOTE — Progress Notes (Signed)
[facility] ROUNDING NOTE   Subjective:   Interval History:  Resting in room   Objective:  Vital signs in last 24 hours:  Temp:  [97 F (36.1 C)-97.7 F (36.5 C)] 97.4 F (36.3 C) (05/10 0844) Pulse Rate:  [55-87] 87 (05/10 0844) Resp:  [16-37] 24 (05/10 0844) BP: (87-114)/(58-83) 97/70 mmHg (05/10 0844) SpO2:  [91 %-100 %] 100 % (05/10 0844)  Weight change: -3.276 kg (-7 lb 3.5 oz) Filed Weights   06/04/15 2308 06/05/15 0549 06/05/15 0948  Weight: 72.576 kg (160 lb) 72.6 kg (160 lb 0.9 oz) 69.3 kg (152 lb 12.5 oz)    Intake/Output: I/O last 3 completed shifts: In: 2000 [P.O.:500; I.V.:1250; IV Piggyback:250] Out: 2300 [Other:2300]   Intake/Output this shift:  Total I/O In: 240 [P.O.:240] Out: 150 [Urine:150]  TTS at [facility] 4 hours, 180 dialyzer, blood flow rate 350, dialysate flow rate 800, EDW 69 kg, 2K/2.25 calcium, UF profile 2, no sodium modeling, no heparin. Aranesp 150 g weekly Head: Normocephalic and atraumatic.  Cardiovascular: Normal rate and regular rhythm. Murmur heard.3/6 ejection systolic murmur  Respiratory: Breath sounds normal. He has no wheezes. He has no rales.  GI: Soft. Bowel sounds are normal. He exhibits distension. There is no tenderness. There is no guarding.  1-2+ lower extremity edema. Left upper arm brachiocephalic fistula-pulsatile  Right mid back with focal indurated area status post Incision and drainage-serosanguineous drainage   Basic Metabolic Panel:  Recent Labs Lab 06/04/15 2337 06/05/15 0332 06/06/15 0431  NA 130* 131* 137  K >7.5* 7.2* 4.1  CL 91* 96* 98*  CO2 18* 14* 21*  GLUCOSE 89 52* 138*  BUN 178* 181* 83*  CREATININE 14.24* 13.47* 8.29*  CALCIUM 9.3 8.4* 8.4*  PHOS  --   --  8.0*    Liver Function Tests:  Recent Labs Lab 06/04/15 2337 06/05/15 0332 06/06/15 0431  AST 19 25  --   ALT 19 15*  --   ALKPHOS 89 70  --   BILITOT 1.1 0.8  --   PROT 5.7* 4.4*  --    ALBUMIN 1.7* 1.3* 1.2*   No results for input(s): LIPASE, AMYLASE in the last 168 hours. No results for input(s): AMMONIA in the last 168 hours.  CBC:  Recent Labs Lab 06/04/15 2337 06/05/15 0332 06/06/15 0438  WBC 1.5* 2.5* 5.1  NEUTROABS 0.6*  --  3.0  HGB 17.1* 9.1* 9.8*  HCT 51.5 28.5* 30.5*  MCV 91.0 91.3 92.1  PLT 111* 143* 169    Cardiac Enzymes: No results for input(s): CKTOTAL, CKMB, CKMBINDEX, TROPONINI in the last 168 hours.  BNP: Invalid input(s): POCBNP  CBG:  Recent Labs Lab 06/05/15 1124 06/05/15 1248 06/05/15 1622 06/06/15 0841  GLUCAP 47* 117* 99 98    Microbiology: Results for orders placed or performed during the hospital encounter of 06/04/15  Urine culture     Status: Abnormal (Preliminary result)   Collection Time: 06/04/15 11:21 PM  Result Value Ref Range Status   Specimen Description URINE, RANDOM  Final   Special Requests NONE  Final   Culture >=100,000 COLONIES/mL ENTEROCOCCUS SPECIES (A)  Final   Report Status PENDING  Incomplete    Coagulation Studies:  Recent Labs  06/05/15 0238  LABPROT 17.3*  INR 1.40    Urinalysis:  Recent Labs  06/04/15 2321  COLORURINE YELLOW  LABSPEC 1.013  PHURINE 7.5  GLUCOSEU NEGATIVE  HGBUR MODERATE*  BILIRUBINUR NEGATIVE  KETONESUR NEGATIVE  PROTEINUR 30*  NITRITE NEGATIVE  LEUKOCYTESUR NEGATIVE      Imaging: US Paracentesis  06/04/2015  INDICATION: Ascites EXAM: ULTRASOUND-GUIDED PARACENTESIS COMPARISON:  None. MEDICATIONS: 10 cc 1% lidocaine COMPLICATIONS: None immediate. TECHNIQUE: Informed written consent was obtained from the patient after a discussion of the risks, benefits and alternatives to treatment. A timeout was performed prior to the initiation of the procedure. Initial ultrasound scanning demonstrates a large amount of ascites within the right lower abdominal quadrant. The right lower abdomen was prepped and draped in the usual sterile fashion. 1% lidocaine with  epinephrine was used for local anesthesia. Under direct ultrasound guidance, a 19 gauge, 7-cm, Yueh catheter was introduced. An ultrasound image was saved for documentation purposed. The paracentesis was performed. The catheter was removed and a dressing was applied. The patient tolerated the procedure well without immediate post procedural complication. FINDINGS: A total of approximately 5.6 liters of yellow fluid was removed. IMPRESSION: Successful ultrasound-guided paracentesis yielding 5.6 liters of peritoneal fluid. Read by:  Lavonia Drafts [facility] Electronically Signed   By: Jerilynn Mages.  Shick M.D.   On: 06/04/2015 14:35   Dg Chest Port 1 View  06/05/2015  CLINICAL DATA:  Weakness and fever. EXAM: PORTABLE CHEST 1 VIEW COMPARISON:  05/19/2015 FINDINGS: A single AP portable view of the chest demonstrates no focal airspace consolidation or alveolar edema. The lungs are grossly clear. There is no large effusion or pneumothorax. There is unchanged cardiomegaly. Cardiac and mediastinal contours are otherwise unremarkable. IMPRESSION: Stable cardiomegaly.  No acute cardiopulmonary findings. Electronically Signed   By: Andreas Newport M.D.   On: 06/05/2015 00:02     Medications:     . doxycycline  100 mg Oral Q12H  . feeding supplement (NEPRO CARB STEADY)  237 mL Oral TID BM  . heparin  5,000 Units Subcutaneous Q8H  . hydrocortisone cream   Topical QID  . levofloxacin  500 mg Oral Q48H  . 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 Presented to the emergency room with global but  primarily lower extremity weakness after paracentesis earlier today by interventional radiology. He denies any other focal neurological symptoms and provides conflicting details about "a boil in his back" that was drained by his brother yesterday morning to drain some pus at home. He has been undergoing frequent paracentesis for recurrent ascites and is being planned for transjugular liver biopsy to explain his portal hypertension.  1. Hyperkalemia: this is most likely due to his noncompliance with hemodialysis and most likely noncompliance with the prescribed renal diet. Resolved  2. End-stage renal disease: prescribed for Tuesday/Thursday/Saturday   3. Abscess over his back with associated cellulitis: Sepsis present on admission due to tachypnea and leukopenia by previous sepsis criteria. No severe sepsis or septic shock. Etiology is back abscess with cellulitis. Causative organism unknown - culture was not sent. -D/c Vancomycin (5/9-5/10) and Zosyn (5/9-5/10) and transition to PO Doxy and Levaquin -F/u blood Cx 4. Ascites: With portal hypertension of unclear etioathology-transjugular liver biopsy scheduled. 5. Anemia:  Hb 9.8   6. Metabolic bone disease: resume Renvela for phosphorus binding, He does not appear to be on Hectorol from outpatient records.   LOS: 1 Mrk Buzby W @TODAY @11 :53 AM

## 2015-06-06 NOTE — Progress Notes (Signed)
Subjective: Doing well this morning. Ambulating. Strength feels improved.  Objective: Vital signs in last 24 hours: Filed Vitals:   06/05/15 2355 06/06/15 0200 06/06/15 0404 06/06/15 0844  BP: 106/63 104/73 114/68 97/70  Pulse: 78 74 80 87  Temp: 97.6 F (36.4 C)  97 F (36.1 C) 97.4 F (36.3 C)  TempSrc: Oral  Oral Oral  Resp: 19 20 16 24   Height:      Weight:      SpO2: 93%  95% 100%   Weight change: -7 lb 3.5 oz (-3.276 kg)  Intake/Output Summary (Last 24 hours) at 06/06/15 1118 Last data filed at 06/06/15 1003  Gross per 24 hour  Intake    640 ml  Output    150 ml  Net    490 ml   General Apperance: NAD HEENT: Normocephalic, atraumatic, anicteric sclera Neck: Supple, trachea midline Lungs: Clear to auscultation bilaterally. No wheezes, rhonchi or rales. Breathing comfortably Heart: Regular rate and rhythm Abdomen: Soft, nontender, distended, no rebound/guarding Extremities: Warm and well perfused, 1+ pedal edema Skin: No rashes or lesions Neurologic: Alert and interactive. No gross deficits.  Lab Results: Basic Metabolic Panel:  Recent Labs Lab 06/05/15 0332 06/06/15 0431  NA 131* 137  K 7.2* 4.1  CL 96* 98*  CO2 14* 21*  GLUCOSE 52* 138*  BUN 181* 83*  CREATININE 13.47* 8.29*  CALCIUM 8.4* 8.4*  PHOS  --  8.0*   Liver Function Tests:  Recent Labs Lab 06/04/15 2337 06/05/15 0332 06/06/15 0431  AST 19 25  --   ALT 19 15*  --   ALKPHOS 89 70  --   BILITOT 1.1 0.8  --   PROT 5.7* 4.4*  --   ALBUMIN 1.7* 1.3* 1.2*   CBC:  Recent Labs Lab 06/04/15 2337 06/05/15 0332 06/06/15 0438  WBC 1.5* 2.5* 5.1  NEUTROABS 0.6*  --  3.0  HGB 17.1* 9.1* 9.8*  HCT 51.5 28.5* 30.5*  MCV 91.0 91.3 92.1  PLT 111* 143* 169   CBG:  Recent Labs Lab 06/05/15 1124 06/05/15 1248 06/05/15 1622 06/06/15 0841  GLUCAP 47* 117* 99 98   Coagulation:  Recent Labs Lab 06/05/15 0238  LABPROT 17.3*  INR 1.40   Urine Drug Screen: Drugs of Abuse    Component Value Date/Time   LABOPIA NONE DETECTED 05/20/2015 1910   COCAINSCRNUR POSITIVE* 05/20/2015 1910   LABBENZ NONE DETECTED 05/20/2015 1910   AMPHETMU NONE DETECTED 05/20/2015 1910   THCU NONE DETECTED 05/20/2015 1910   LABBARB NONE DETECTED 05/20/2015 1910    Urinalysis:  Recent Labs Lab 06/04/15 2321  COLORURINE YELLOW  LABSPEC 1.013  PHURINE 7.5  GLUCOSEU NEGATIVE  HGBUR MODERATE*  BILIRUBINUR NEGATIVE  KETONESUR NEGATIVE  PROTEINUR 30*  NITRITE NEGATIVE  LEUKOCYTESUR NEGATIVE   Micro Results: Recent Results (from the past 240 hour(s))  Urine culture     Status: Abnormal (Preliminary result)   Collection Time: 06/04/15 11:21 PM  Result Value Ref Range Status   Specimen Description URINE, RANDOM  Final   Special Requests NONE  Final   Culture >=100,000 COLONIES/mL ENTEROCOCCUS SPECIES (A)  Final   Report Status PENDING  Incomplete   Studies/Results: US Paracentesis  06/04/2015  INDICATION: Ascites EXAM: ULTRASOUND-GUIDED PARACENTESIS COMPARISON:  None. MEDICATIONS: 10 cc 1% lidocaine COMPLICATIONS: None immediate. TECHNIQUE: Informed written consent was obtained from the patient after a discussion of the risks, benefits and alternatives to treatment. A timeout was performed prior to the initiation of the procedure.  Initial ultrasound scanning demonstrates a large amount of ascites within the right lower abdominal quadrant. The right lower abdomen was prepped and draped in the usual sterile fashion. 1% lidocaine with epinephrine was used for local anesthesia. Under direct ultrasound guidance, a 19 gauge, 7-cm, Yueh catheter was introduced. An ultrasound image was saved for documentation purposed. The paracentesis was performed. The catheter was removed and a dressing was applied. The patient tolerated the procedure well without immediate post procedural complication. FINDINGS: A total of approximately 5.6 liters of yellow fluid was removed. IMPRESSION: Successful  ultrasound-guided paracentesis yielding 5.6 liters of peritoneal fluid. Read by:  Lavonia Drafts [facility] Electronically Signed   By: Jerilynn Mages.  Shick M.D.   On: 06/04/2015 14:35   Dg Chest Port 1 View  06/05/2015  CLINICAL DATA:  Weakness and fever. EXAM: PORTABLE CHEST 1 VIEW COMPARISON:  05/19/2015 FINDINGS: A single AP portable view of the chest demonstrates no focal airspace consolidation or alveolar edema. The lungs are grossly clear. There is no large effusion or pneumothorax. There is unchanged cardiomegaly. Cardiac and mediastinal contours are otherwise unremarkable. IMPRESSION: Stable cardiomegaly.  No acute cardiopulmonary findings. Electronically Signed   By: Andreas Newport M.D.   On: 06/05/2015 00:02   Medications: I have reviewed the patient's current medications. Scheduled Meds: . feeding supplement (NEPRO CARB STEADY)  237 mL Oral TID BM  . heparin  5,000 Units Subcutaneous Q8H  . hydrocortisone cream   Topical QID  . pantoprazole  40 mg Oral BID  . piperacillin-tazobactam (ZOSYN)  IV  2.25 g Intravenous Q8H  . vancomycin  750 mg Intravenous Q T,Th,Sa-HD   Continuous Infusions:  PRN Meds:.sodium chloride, sodium chloride, alteplase, fentaNYL (SUBLIMAZE) injection, heparin, lidocaine (PF), lidocaine-prilocaine, ondansetron, pentafluoroprop-tetrafluoroeth, polyethylene glycol Assessment/Plan: 60 year old man with ESRD on HD TTS, GPA, HTN, substance abuse presenting with leg weakness after paracentesis.  Transient Lower Extremity Weakness: Resolving. Etiology unclear, but potentially due to hypotension and/or fluid shifts following large-volume paracentesis.  Back abscess with cellulitis: Sepsis present on admission due to tachypnea and leukopenia by previous sepsis criteria. No severe sepsis or septic shock. Etiology is back abscess with cellulitis. Causative organism unknown - culture was not sent. -D/c Vancomycin (5/9-5/10) and Zosyn (5/9-5/10) and transition to PO Doxy and  Levaquin -F/u blood Cx  Hyperkalemia 2/2 ESRD on HD: K>7.5 on presentation. Resolved.  -Nephrology following, appreciate recommendations  Ascites: Portal hypertension of unclear etiology. Has TJ biopsy scheduled for tomorrow afternoon (5/11) at [facility]  Granulomatosis with polyangiitis: Currently no complaints of rash. Has appt with Dr. Amil Amen 5/24. Home hydrocortisone cream PRN.  Dispo: Likely home today  The patient does have a current PCP (No primary care provider on file.) and does need an [facility] hospital follow-up appointment after discharge.  The patient does not have transportation limitations that hinder transportation to clinic appointments.  .Services Needed at time of discharge: Y = Yes, Blank = No PT:   OT:   RN:   Equipment:   Other:     LOS: 1 day   Milagros Loll, MD 06/06/2015, 11:18 AM

## 2015-06-06 NOTE — Discharge Planning (Cosign Needed)
Discharge Diagnosis: 1. Transient lower extremity weakness 2. Ascites 3. End stage renal disease on hemodialysis 4. Hyperkalemia 5. Leukopenia 6. Thrombocytopenia 7. Abscess with cellulitis 8. Granulomatosis with polyangiitis   Disposition and follow-up:   Mr.Jawara Gwynneth Albright. was discharged from [facility] in Stable condition.  At the hospital follow up visit please address:  1. Transient lower extremity weakness: Patient presented with significant weakness in bilateral lower extremities several hours following therapeutic paracentesis of 5.6L. Thought to be due to acute fluid shifts and hypovolemia. Initial concern for sepsis given severe leukopenia to 1.5 or metabolic derangement given potassium of >7.5 and sodium of 131. Began to resolve quickly overnight, and he described ~75-80% of baseline strength at discharge. Able to ambulate spontaneously. Please follow-up for complete resolution of weakness and return to baseline functionality.   Hyperkalemia. Potassium of >7.5 on admission. Likely due to inconsistency in dialysis session attendance. Please continue to stress the importance of these sessions in order for the patient to feel well, avoid further disease, and stay out of the hospital.   ESRD: On hemodialysis Tuesday, Thursday, and Saturday as described above. Patient inconsistent about hemodialysis attendance record. Received one hemodialysis session no 06/05/15 while admitted.   Ascites: Unclear underlying etiology, scheduled for liver biopsy on 5/11 at [facility].   Back abscess with cellulitis: He presented with an approximately 2x2cm flucuant mass in the mid-back, reportedly present for ~3 weeks prior to presentation. There was overlying erythema and warmth. His brother tried to lance it at home, and it was then incised and drained in the emergency department upon presentation. Blood cultures were collected in the hospital, but no fluid culture or  gram stain was obtained. Please follow-up for resolution of abscess and overlying cellulitic changes, and continued normalization of CBC.   Leukopenia, thrombocytopenia: Presented with severe leukopenia to 1.5, which trended upward to 5.1 by the morning of discharge. Thrombocytopenic to 111 on presentation, resolved up to 169 on discharge. Combined with lower extremity weakness, these symptoms were initially concerning for sepsis. He did have obvious infectious source in the back abscess with overlying cellulitis. Please follow-up normalization of CBC.   2.  Labs / imaging needed at time of follow-up: CBC. BMP if clinical concern regarding metabolic status or hemodialysis adherence  3.  Pending labs/ test needing follow-up: Follow-up blood cultures from admission    Admission HPI:  60 y/o man with past medical history significant for end stage renal disease on regularly scheduled Tuesday, Thursday, and Saturday hemodialysis, granulomatosus with polyangiitis, esophagitis, cocaine abuse, fournier gangrene, and recently ascites with poorly understood etiology presents to the emergency department for weakness after undergoing a 5.6L paracentesis at interventional radiology today. He was feeling fine until after his procedure he developed severe weakness of his legs preventing him from standing back up. He has had this sensation after paracentesis before but never to the extent he cannot walk easily. He denies any dizziness, vision changes, or shortness of breath. He has no headache or confusion. He denies any significant abdominal pain. However he also reports a boil on his back that has been bothering him for about 3 weeks. He reports purulent drainage when brother cut this open for him earlier Monday after "he sterilized it". A 3x3cm nodule with the surrounding area indurated and inflamed was noted by the emergency department provider who repeated incision & drainage with minimal further purulence expressed.  He denies having previous skin wounds similar to this one.  While in the emergency department, labs were significant for a potassium of >7.5 prompting Nephrology consultation for possible urgent hemodialysis. Other chemistry abnormal consistent with his end stage renal disease several days since hemodialysis. Complete blood count showing WBC of 1.5 with 0.6 absolute neutrophil count and 111 platelets.  He was previously admitted on 4/22 for abdominal pain found to have ascites and due to his end stage renal disease planned for weekly paracentesis to limit fluid reaccumulation. He was seen in clinic on 5/3 where he had moderate lower back pain, was seen by Dr. Havery Moros in clinic who plans for liver biopsy. The cause of his ascites was not clearly identified with a mixed picture of low Serum ascites albumin gradient however imaging studies demonstrating portal hypertension. He reports attending his hemodialysis sessions without complication.  Hospital Course by problem list:   1. Transient lower extremity Weakness: Presented with bilateral lower extremity weakness following therapeutic paracentesis of 5.6 L. Etiology unclear, but potentially related to hypotension and/or fluid shifts following large-volume paracentesis. Also received workup for infection and/or sepsis, and metabolic derangement. On admission, Chest x-ray negative. No focal symptoms or confusion. Diagnostic workup was non-contributory, and he began to experience symptomatic resolution overnight which continued throughout the hospitalization. He was able to ambulate on hospital day 2, and estimated to be at ~80% of baseline strength on Hospital day 3. He was clinically stable and was discharged on 06/06/15 with follow-up scheduled in clinic for 06/20/15.   2. Hyperkalemia: Potassium >7.5 on admission. Electrocardiogram showed partial t wave changes. Came to hospital for bilateral lower extremity weakness, but denied other symptoms of  hyperkalemia. This was thought to be due to inconsistent hemodialysis attendance; last recorded nephrology note was from 05/29/15. Calcium gluconate, insulin, and kayexalate were given in the emergency department, and he received hemodialysis on 06/05/15. Repeat bloodwork the next morning showed normal post-dialysis values with potassium of 4.1. The weakness mostly resolved, as described above. He was stable from a clinical and laboratory standpoint and was discharged home on 06/06/15 with a next dialysis session scheduled for 06/07/15 and a clinic follow-up appointment on 06/20/15.   3. Ascites: He has been followed in clinic for this ascites of unknown underlying etiology. Recently, he has been symptomatically treated with repeat therapeutic paracenteses as frequently as once per week. He presented after large-volume paracentesis of 5.6 L. His exam at that time revealed a soft, non-tender, non-distended abdomen. The next morning he was more distended, though still soft and non-tender. He is scheduled for a transjugular liver biopsy on 06/07/15 at [facility].   4. Abscess with overlying cellulitis: Patient presented with an approximately 2x2cm flucuant mass in the mid-back, reportedly present for ~3 weeks prior to presentation. There was overlying erythema and warmth. His brother tried to lance it at home, and it was then incised and drained in the ED upon presentation. He had leukocytopenia and thrombocytopenia on admission, and was treated intiailly with vancomycin and zosyn out of concern for sepsis. Blood cultures were collected in the hospital and pending as of his discharge. Other than a mild fever on presentation, he remained afebrile throughout the admission, and clinically improved quickly. By the day of discharge, hospital day 3, his WBC was 5.1 and platelets were 169. He was transitioned to oral doxycycline and Levaquin and discharge on 06/06/15 with a follow-up clinic appointment on  06/20/15.

## 2015-06-06 NOTE — Progress Notes (Signed)
Patient discharged to home via friend. All belongings returned, all IV's removed. Dressing on back changed, dressing change supplies (extra gauze, tape) given to patient. Discharge instructions reviewed and patient verbalized understanding. No c/o pain or discomfort at discharge.

## 2015-06-06 NOTE — Discharge Summary (Signed)
Name: Alexander Grant. MRN: [id number] DOB: 1955/03/04 60 y.o. PCP: No primary care provider on file.  Date of Admission: 06/04/2015 11:03 PM Date of Discharge: 06/06/2015 Attending Physician: Annia Belt, MD  Discharge Diagnosis: 1. Transient lower extremity weakness 2. Ascites 3. End stage renal disease on hemodialysis 4. Hyperkalemia 5. Leukopenia 6. Thrombocytopenia 7. Abscess with cellulitis 8. Granulomatosis with polyangiitis  Discharge Medications:   Medication List    STOP taking these medications        predniSONE 20 MG tablet  Commonly known as:  DELTASONE      TAKE these medications        Darbepoetin Alfa 200 MCG/0.4ML Sosy injection  Commonly known as:  ARANESP  Inject 0.4 mLs (200 mcg total) into the vein every Tuesday with hemodialysis.     doxycycline 100 MG tablet  Commonly known as:  VIBRA-TABS  Take 1 tablet (100 mg total) by mouth every 12 (twelve) hours.     feeding supplement (NEPRO CARB STEADY) Liqd  Take 237 mLs by mouth 3 (three) times daily between meals.     feeding supplement (PRO-STAT SUGAR FREE 64) Liqd  Take 30 mLs by mouth 2 (two) times daily.     HYDROcodone-acetaminophen 5-325 MG tablet  Commonly known as:  NORCO/VICODIN  Take 1 tablet by mouth every 6 (six) hours as needed for moderate pain.     hydrocortisone cream 1 %  Apply topically 4 (four) times daily.     levofloxacin 500 MG tablet  Commonly known as:  LEVAQUIN  Take 1 tablet (500 mg total) by mouth every other day.     multivitamin Tabs tablet  Take 1 tablet by mouth at bedtime.     ondansetron 4 MG tablet  Commonly known as:  ZOFRAN  Take 1 tablet (4 mg total) by mouth every 6 (six) hours as needed for nausea.     pantoprazole 40 MG tablet  Commonly known as:  PROTONIX  Take 1 tablet (40 mg total) by mouth 2 (two) times daily.     polyethylene glycol packet  Commonly known as:  MIRALAX / GLYCOLAX  Take 17 g by mouth daily.     sevelamer  carbonate 800 MG tablet  Commonly known as:  RENVELA  Take 3 tablets (2,400 mg total) by mouth 3 (three) times daily with meals.        Disposition and follow-up:   [name]. was discharged from [facility] in Stable condition.  At the hospital follow up visit please address:  1.  Transient lower extremity weakness: Patient presented with significant weakness in bilateral lower extremities several hours following therapeutic paracentesis of 5.6L. Thought to be due to acute fluid shifts and hypovolemia. Please follow-up for complete resolution of weakness and return to baseline functionality.    Hyperkalemia: Likely due to inconsistency in dialysis session attendance. Please continue to stress the importance of these sessions in order for the patient to feel well, avoid further disease, and stay out of the hospital.    Ascites: Unclear underlying etiology, scheduled for liver biopsy on 5/11 at [facility].   Back abscess with cellulitis: Please follow-up for resolution of abscess and overlying cellulitic changes, and continued normalization of CBC.   2.  Labs / imaging needed at time of follow-up: BMP, CBC  3.  Pending labs/ test needing follow-up: Blood cultures  Follow-up Appointments:     Follow-up Information    Follow up with  Liberty Handy, MD. Daphane Shepherd on 06/20/2015.   Specialty:  Internal Medicine   Why:  10:45AM for hospital follow-up   Contact information:   [address] [place] [phone]       Discharge Instructions: Discharge Instructions    Call MD for:  difficulty breathing, headache or visual disturbances    Complete by:  As directed      Call MD for:  extreme fatigue    Complete by:  As directed      Call MD for:  persistant dizziness or light-headedness    Complete by:  As directed      Call MD for:  persistant nausea and vomiting    Complete by:  As directed      Call MD for:  severe uncontrolled  pain    Complete by:  As directed      Call MD for:  temperature >100.4    Complete by:  As directed      Increase activity slowly    Complete by:  As directed            Consultations: Treatment Team:  Elmarie Shiley, MD  Procedures Performed:  X-ray Chest Pa And Lateral  05/08/2015  CLINICAL DATA:  60 year old male with vomiting today during dialysis. Hypoxia and cough. Initial encounter. EXAM: CHEST  2 VIEW COMPARISON:  [facility] CT Abdomen and Pelvis 04/27/2015 Report of chest radiograph 02/07/2013 (no images available). FINDINGS: Low lung volumes with continued small to moderate right pleural effusion. No superimposed pneumothorax, pulmonary edema, or confluent pulmonary opacity. Stable cardiomegaly and mediastinal contours. Visualized tracheal air column is within normal limits. No acute osseous abnormality identified. Calcified aortic atherosclerosis. Gray hazy opacity throughout the abdomen compatible with the large volume ascites seen recently by CT. IMPRESSION: 1. Small to moderate right pleural effusion appears stable since 04/27/2015. 2. No new cardiopulmonary abnormality. 3. Ascites. Electronically Signed   By: Genevie Ann M.D.   On: 05/08/2015 11:41   [place] Art/ven Flow Abd Pelv Doppler  05/11/2015  CLINICAL DATA:  End-stage renal disease, dialysis dependent, cirrhosis, abdominal ascites and distension EXAM: DUPLEX ULTRASOUND OF LIVER TECHNIQUE: Color and duplex Doppler ultrasound was performed to evaluate the hepatic in-flow and out-flow vessels. COMPARISON:  04/27/2015, 05/11/2015 FINDINGS: Portal Vein Velocities Main:  35 cm/sec Right:  36 cm/sec Left:  45 cm/sec Hepatic Vein Velocities Right:  240 cm/sec Middle:  132 cm/sec Left:  37 cm/sec Hepatic Artery Velocity:  126 cm/sec Splenic Vein Velocity:  20 cm/sec Varices: Varices noted in the splenic hilum. Ascites: Large volume diffuse abdominal ascites noted. Portal vein is dilated with a 13 mm diameter. Portal, hepatic, and splenic  veins are patent. Main portal vein and left portal vein remain hepatopetal. Right portal vein is hepatofugal. Splenic vein remains hepatopetal. All hepatic veins are patent and hepatofugal. No evidence of portal vein or splenic vein thrombus or occlusion. Cirrhotic changes noted of the liver.  Spleen is enlarged. IMPRESSION: Patent portal, hepatic and splenic veins without occlusion or thrombus. Evidence of portal hypertension with a dilated portal vein. Main portal vein and left portal vein remain hepatopetal however the right portal vein is hepatofugal. Large volume of ascites Splenomegaly Left upper quadrant varices noted in the splenic hilum. Electronically Signed   By: Jerilynn Mages.  Shick M.D.   On: 05/11/2015 16:06   US Paracentesis  06/04/2015  INDICATION: Ascites EXAM: ULTRASOUND-GUIDED PARACENTESIS COMPARISON:  None. MEDICATIONS: 10 cc 1% lidocaine COMPLICATIONS: None immediate. TECHNIQUE: Informed written consent  was obtained from the patient after a discussion of the risks, benefits and alternatives to treatment. A timeout was performed prior to the initiation of the procedure. Initial ultrasound scanning demonstrates a large amount of ascites within the right lower abdominal quadrant. The right lower abdomen was prepped and draped in the usual sterile fashion. 1% lidocaine with epinephrine was used for local anesthesia. Under direct ultrasound guidance, a 19 gauge, 7-cm, Yueh catheter was introduced. An ultrasound image was saved for documentation purposed. The paracentesis was performed. The catheter was removed and a dressing was applied. The patient tolerated the procedure well without immediate post procedural complication. FINDINGS: A total of approximately 5.6 liters of yellow fluid was removed. IMPRESSION: Successful ultrasound-guided paracentesis yielding 5.6 liters of peritoneal fluid. Read by:  Lavonia Drafts [facility] Electronically Signed   By: Jerilynn Mages.  Shick M.D.   On: 06/04/2015 14:35   US  Paracentesis  05/28/2015  INDICATION: End-stage renal disease on hemodialysis and a history of cirrhosis with recurrent ascites. Request is made today for a therapeutic paracentesis. EXAM: ULTRASOUND GUIDED THERAPEUTIC PARACENTESIS MEDICATIONS: 1% lidocaine COMPLICATIONS: None immediate. PROCEDURE: Informed written consent was obtained from the patient after a discussion of the risks, benefits and alternatives to treatment. A timeout was performed prior to the initiation of the procedure. Initial ultrasound scanning demonstrates a large amount of ascites within the right lower abdominal quadrant. The right lower abdomen was prepped and draped in the usual sterile fashion. 1% lidocaine was used for local anesthesia. Following this, a 19 gauge, 7-cm, Yueh catheter was introduced. An ultrasound image was saved for documentation purposes. The paracentesis was performed. The catheter was removed and a dressing was applied. The patient tolerated the procedure well without immediate post procedural complication. FINDINGS: A total of approximately 9.4 L of clear yellow fluid was removed. IMPRESSION: Successful ultrasound-guided paracentesis yielding 9.4 liters of peritoneal fluid. Read by: Saverio Danker, PA-C Electronically Signed   By: Corrie Mckusick D.O.   On: 05/28/2015 15:17   US Paracentesis  05/23/2015  INDICATION: Liver disease an ascites. EXAM: ULTRASOUND GUIDED RIGHT LOWER QUADRANT PARACENTESIS MEDICATIONS: None. COMPLICATIONS: None immediate. PROCEDURE: Informed written consent was obtained from the patient after a discussion of the risks, benefits and alternatives to treatment. A timeout was performed prior to the initiation of the procedure. Initial ultrasound scanning demonstrates a large amount of ascites within the right lower abdominal quadrant. The right lower abdomen was prepped and draped in the usual sterile fashion. 1% lidocaine with epinephrine was used for local anesthesia. Following this, a  Safe-T-Centesis catheter was introduced. An ultrasound image was saved for documentation purposes. The paracentesis was performed. The catheter was removed and a dressing was applied. The patient tolerated the procedure well without immediate post procedural complication. FINDINGS: A total of approximately 6 L of clear fluid was removed. IMPRESSION: Successful ultrasound-guided paracentesis yielding 6 liters of peritoneal fluid. Electronically Signed   By: Marybelle Killings M.D.   On: 05/23/2015 13:48   US Paracentesis  05/20/2015  INDICATION: With end-stage renal disease on hemodialysis and a history of cirrhosis with recurrent ascites. Request is made for diagnostic and therapeutic paracentesis. EXAM: ULTRASOUND GUIDED DIAGNOSTIC AND THERAPEUTIC PARACENTESIS MEDICATIONS: 1% lidocaine COMPLICATIONS: None immediate. PROCEDURE: Informed written consent was obtained from the patient after a discussion of the risks, benefits and alternatives to treatment. A timeout was performed prior to the initiation of the procedure. Initial ultrasound scanning demonstrates a large amount of ascites within the left lower abdominal quadrant. The  left lower abdomen was prepped and draped in the usual sterile fashion. 1% lidocaine was used for local anesthesia. Following this, a 19 gauge, 7-cm, Yueh catheter was introduced. An ultrasound image was saved for documentation purposes. The paracentesis was performed. The catheter was removed and a dressing was applied. The patient tolerated the procedure well without immediate post procedural complication. FINDINGS: A total of approximately 4 L of clear yellow fluid was removed. Samples were sent to the laboratory as requested by the clinical team. IMPRESSION: Successful ultrasound-guided paracentesis yielding 4 liters of peritoneal fluid. Read by: Saverio Danker, PA-C Electronically Signed   By: Lucrezia Europe M.D.   On: 05/20/2015 10:31   US Paracentesis  05/11/2015  INDICATION: End stage  renal failure on hemodialysis. Massive ascites secondary to cirrhosis. Request for diagnostic and therapeutic paracentesis up to 4 liters maximum. EXAM: ULTRASOUND GUIDED RIGHT LOWER QUADRANT PARACENTESIS MEDICATIONS: None. COMPLICATIONS: None immediate. PROCEDURE: Informed written consent was obtained from the patient after a discussion of the risks, benefits and alternatives to treatment. A timeout was performed prior to the initiation of the procedure. Initial ultrasound scanning demonstrates a large amount of ascites within the right lower abdominal quadrant. The right lower abdomen was prepped and draped in the usual sterile fashion. 1% lidocaine with epinephrine was used for local anesthesia. Following this, a 19 gauge, 7-cm, Yueh catheter was introduced. An ultrasound image was saved for documentation purposes. The paracentesis was performed. The catheter was removed and a dressing was applied. The patient tolerated the procedure well without immediate post procedural complication. FINDINGS: A total of approximately 4 liters of clear yellow fluid was removed. Samples were sent to the laboratory as requested by the clinical team. IMPRESSION: Successful ultrasound-guided paracentesis yielding 4 liters of peritoneal fluid. Read by:  Gareth Eagle, PA-C Electronically Signed   By: Corrie Mckusick D.O.   On: 05/11/2015 14:12   Dg Chest Port 1 View  06/05/2015  CLINICAL DATA:  Weakness and fever. EXAM: PORTABLE CHEST 1 VIEW COMPARISON:  05/19/2015 FINDINGS: A single AP portable view of the chest demonstrates no focal airspace consolidation or alveolar edema. The lungs are grossly clear. There is no large effusion or pneumothorax. There is unchanged cardiomegaly. Cardiac and mediastinal contours are otherwise unremarkable. IMPRESSION: Stable cardiomegaly.  No acute cardiopulmonary findings. Electronically Signed   By: Andreas Newport M.D.   On: 06/05/2015 00:02   Dg Chest Port 1 View  05/19/2015  CLINICAL DATA:   Pt reports SOB, rash, and abdominal distension onset today; pt receives dialysis and had treatment today; non-smoker EXAM: PORTABLE CHEST 1 VIEW COMPARISON:  05/08/2015 FINDINGS: Allowing for low lung volumes, the lungs are clear. The cardiac silhouette is normal in size. No mediastinal hilar masses or convincing adenopathy. But no convincing pleural effusion.  No pneumothorax. Bony thorax is demineralized. IMPRESSION: No acute cardiopulmonary disease. Electronically Signed   By: Lajean Manes M.D.   On: 05/19/2015 15:52   Admission HPI: 60 y/o man with past medical history significant for end stage renal disease on regularly scheduled Tuesday, Thursday, and Saturday hemodialysis, granulomatosus with polyangiitis, esophagitis, cocaine abuse, fournier gangrene, and recently ascites with poorly understood etiology presents to the emergency department for weakness after undergoing a 5.6L paracentesis at interventional radiology today. He was feeling fine until after his procedure he developed severe weakness of his legs preventing him from standing back up. He has had this sensation after paracentesis before but never to the extent he cannot walk easily. He denies any  dizziness, vision changes, or shortness of breath. He has no headache or confusion. He denies any significant abdominal pain. However he also reports a boil on his back that has been bothering him for about 3 weeks. He reports purulent drainage when brother cut this open for him earlier Monday after "he sterilized it". A 3x3cm nodule with the surrounding area indurated and inflamed was noted by the emergency department provider who repeated incision & drainage with minimal further purulence expressed. He denies having previous skin wounds similar to this one.    While in the emergency department, labs were significant for a potassium of >7.5 prompting Nephrology consultation for possible urgent hemodialysis. Other chemistry abnormal consistent with  his end stage renal disease several days since hemodialysis. Complete blood count showing WBC of 1.5 with 0.6 absolute neutrophil count and 111 platelets.  He was previously admitted on 4/22 for abdominal pain found to have ascites and due to his end stage renal disease planned for weekly paracentesis to limit fluid reaccumulation. He was seen in clinic on 5/3 where he had moderate lower back pain, was seen by Dr. Havery Moros in clinic who plans for liver biopsy.  The cause of his ascites was not clearly identified with a mixed picture of low Serum ascites albumin gradient however imaging studies demonstrating portal hypertension. He reports attending his hemodialysis sessions without complication.  Hospital Course by problem list:  1. Transient lower extremity Weakness: Presented with bilateral lower extremity weakness following therapeutic paracentesis of 5.6 L. Etiology unclear, but potentially related to hypotension and/or fluid shifts following large-volume paracentesis. Also received workup for infection and/or sepsis, and metabolic derangement. On admission, Chest x-ray negative. No focal symptoms or confusion. Diagnostic workup was non-contributory, and he began to experience symptomatic resolution overnight which continued throughout the hospitalization. He was able to ambulate on hospital day 2, and estimated to be at ~80% of baseline strength on Hospital day 3. He was clinically stable and was discharged on 06/06/15 with follow-up scheduled in clinic for 06/20/15.   2. Hyperkalemia: Potassium >7.5 on admission. Electrocardiogram showed partial t wave changes. Came to hospital for bilateral lower extremity weakness, but denied other symptoms of hyperkalemia. This was thought to be due to inconsistent hemodialysis attendance; last recorded nephrology note was from 05/29/15. Calcium gluconate, insulin, and kayexalate were given in the emergency department, and he received hemodialysis on 06/05/15. Repeat  bloodwork the next morning showed normal post-dialysis values with potassium of 4.1. The weakness mostly resolved, as described above. He was stable from a clinical and laboratory standpoint and was discharged home on 06/06/15 with a next dialysis session scheduled for 06/07/15 and a clinic follow-up appointment on 06/20/15.   3. Ascites: He has been followed in clinic for this ascites of unknown underlying etiology. Recently, he has been symptomatically treated with repeat therapeutic paracenteses as frequently as once per week. He presented after large-volume paracentesis of 5.6 L. His exam at that time revealed a soft, non-tender, non-distended abdomen. The next morning he was more distended, though still soft and non-tender. He is scheduled for a transjugular liver biopsy on 06/07/15 at [facility].    4. Abscess with overlying cellulitis: Patient presented with an approximately 2x2cm flucuant mass in the mid-back, reportedly present for ~3 weeks prior to presentation. There was overlying erythema and warmth. His brother tried to lance it at home, and it was then incised and drained in the ED upon presentation. He had leukocytopenia and thrombocytopenia on admission, and was treated  intiailly with vancomycin and zosyn out of concern for sepsis. Blood cultures were collected in the hospital and pending as of his discharge. Other than a mild fever on presentation, he remained afebrile throughout the admission, and clinically improved quickly. By the day of discharge, hospital day 3, his WBC was 5.1 and platelets were 169. He was transitioned to oral doxycycline and Levaquin and discharge on 06/06/15 with a follow-up clinic appointment on 06/20/15.  Discharge Vitals:   BP 97/70 mmHg  Pulse 87  Temp(Src) 97.4 F (36.3 C) (Oral)  Resp 24  Ht 5\' 9"  (1.753 m)  Wt 152 lb 12.5 oz (69.3 kg)  BMI 22.55 kg/m2  SpO2 100%  Discharge Labs:  Results for orders placed or performed during the hospital  encounter of 06/04/15 (from the past 24 hour(s))  Glucose, capillary     Status: Abnormal   Collection Time: 06/05/15 12:48 PM  Result Value Ref Range   Glucose-Capillary 117 (H) 65 - 99 mg/dL  Glucose, capillary     Status: None   Collection Time: 06/05/15  4:22 PM  Result Value Ref Range   Glucose-Capillary 99 65 - 99 mg/dL  Renal function panel     Status: Abnormal   Collection Time: 06/06/15  4:31 AM  Result Value Ref Range   Sodium 137 135 - 145 mmol/L   Potassium 4.1 3.5 - 5.1 mmol/L   Chloride 98 (L) 101 - 111 mmol/L   CO2 21 (L) 22 - 32 mmol/L   Glucose, Bld 138 (H) 65 - 99 mg/dL   BUN 83 (H) 6 - 20 mg/dL   Creatinine, Ser 8.29 (H) 0.61 - 1.24 mg/dL   Calcium 8.4 (L) 8.9 - 10.3 mg/dL   Phosphorus 8.0 (H) 2.5 - 4.6 mg/dL   Albumin 1.2 (L) 3.5 - 5.0 g/dL   GFR calc non Af Amer 6 (L) >60 mL/min   GFR calc Af Amer 7 (L) >60 mL/min   Anion gap 18 (H) 5 - 15  CBC with Differential/Platelet     Status: Abnormal   Collection Time: 06/06/15  4:38 AM  Result Value Ref Range   WBC 5.1 4.0 - 10.5 K/uL   RBC 3.31 (L) 4.22 - 5.81 MIL/uL   Hemoglobin 9.8 (L) 13.0 - 17.0 g/dL   HCT 30.5 (L) 39.0 - 52.0 %   MCV 92.1 78.0 - 100.0 fL   MCH 29.6 26.0 - 34.0 pg   MCHC 32.1 30.0 - 36.0 g/dL   RDW 17.8 (H) 11.5 - 15.5 %   Platelets 169 150 - 400 K/uL   Neutrophils Relative % 60 %   Neutro Abs 3.0 1.7 - 7.7 K/uL   Lymphocytes Relative 9 %   Lymphs Abs 0.5 (L) 0.7 - 4.0 K/uL   Monocytes Relative 24 %   Monocytes Absolute 1.2 (H) 0.1 - 1.0 K/uL   Eosinophils Relative 7 %   Eosinophils Absolute 0.4 0.0 - 0.7 K/uL   Basophils Relative 0 %   Basophils Absolute 0.0 0.0 - 0.1 K/uL  Glucose, capillary     Status: None   Collection Time: 06/06/15  8:41 AM  Result Value Ref Range   Glucose-Capillary 98 65 - 99 mg/dL    Signed: Milagros Loll, MD 06/06/2015, 11:53 AM    Services Ordered on Discharge: None Equipment Ordered on Discharge: None

## 2015-06-07 ENCOUNTER — Ambulatory Visit (HOSPITAL_COMMUNITY)
Admission: RE | Admit: 2015-06-07 | Discharge: 2015-06-07 | Disposition: A | Discharge status: Home / Self Care | Payer: Medicaid Other | Source: Ambulatory Visit | Attending: Gastroenterology | Admitting: Gastroenterology

## 2015-06-07 ENCOUNTER — Encounter (HOSPITAL_COMMUNITY): Payer: Self-pay

## 2015-06-07 ENCOUNTER — Other Ambulatory Visit: Payer: Self-pay | Admitting: Gastroenterology

## 2015-06-07 DIAGNOSIS — N186 End stage renal disease: Secondary | ICD-10-CM | POA: Diagnosis not present

## 2015-06-07 DIAGNOSIS — M3131 Wegener's granulomatosis with renal involvement: Secondary | ICD-10-CM | POA: Insufficient documentation

## 2015-06-07 DIAGNOSIS — R188 Other ascites: Secondary | ICD-10-CM | POA: Diagnosis present

## 2015-06-07 DIAGNOSIS — I120 Hypertensive chronic kidney disease with stage 5 chronic kidney disease or end stage renal disease: Secondary | ICD-10-CM | POA: Insufficient documentation

## 2015-06-07 DIAGNOSIS — Z79899 Other long term (current) drug therapy: Secondary | ICD-10-CM | POA: Insufficient documentation

## 2015-06-07 DIAGNOSIS — K766 Portal hypertension: Secondary | ICD-10-CM | POA: Insufficient documentation

## 2015-06-07 DIAGNOSIS — K7151 Toxic liver disease with chronic active hepatitis with ascites: Secondary | ICD-10-CM | POA: Diagnosis not present

## 2015-06-07 DIAGNOSIS — Z992 Dependence on renal dialysis: Secondary | ICD-10-CM | POA: Diagnosis not present

## 2015-06-07 LAB — URINE CULTURE: Culture: >=100000 — AB

## 2015-06-07 MED ORDER — IOPAMIDOL (ISOVUE-300) INJECTION 61%
50.0000 mL | Freq: Once | INTRAVENOUS | Status: AC | PRN
Start: 2015-06-07 — End: 2015-06-07
  Administered 2015-06-07: 25 mL via INTRAVENOUS

## 2015-06-07 MED ORDER — FENTANYL CITRATE (PF) 100 MCG/2ML IJ SOLN
INTRAMUSCULAR | Status: AC | PRN
  Administered 2015-06-07: 25 ug via INTRAVENOUS

## 2015-06-07 MED ORDER — LIDOCAINE HCL 1 % IJ SOLN
INTRAMUSCULAR | Status: DC
Start: 2015-06-07 — End: 2015-06-08
  Filled 2015-06-07: qty 20

## 2015-06-07 MED ORDER — SODIUM CHLORIDE 0.9 % IV SOLN
Freq: Once | INTRAVENOUS | Status: AC
  Administered 2015-06-07: 13:00:00 via INTRAVENOUS

## 2015-06-07 MED ORDER — FENTANYL CITRATE (PF) 100 MCG/2ML IJ SOLN
INTRAMUSCULAR | Status: AC
  Filled 2015-06-07: qty 2

## 2015-06-07 MED ORDER — MIDAZOLAM HCL 2 MG/2ML IJ SOLN
INTRAMUSCULAR | Status: AC
  Filled 2015-06-07: qty 4

## 2015-06-07 MED ORDER — MIDAZOLAM HCL 2 MG/2ML IJ SOLN
INTRAMUSCULAR | Status: AC | PRN
  Administered 2015-06-07: 0.5 mg via INTRAVENOUS

## 2015-06-07 NOTE — Sedation Documentation (Signed)
Patient denies pain and is resting comfortably.  

## 2015-06-07 NOTE — Procedures (Signed)
Post-Procedure Note  Pre-operative Diagnosis: Recurrent ascites and ESRD       Post-operative Diagnosis: Portal hypertension   Indications: Needs liver biopsy and portal pressures.   Procedure Details:  Right jugular access.  Hepatic wedge pressures obtained.  4 cores obtained with transjugular device.  Findings: Elevated hepatic pressure gradient.  4 cores obtained.    Complications: None     Condition: Stable  Plan: Bedrest 3 hours.

## 2015-06-07 NOTE — Discharge Instructions (Signed)
Liver Biopsy, Care After °Refer to this sheet in the next few weeks. These instructions provide you with information on caring for yourself after your procedure. Your health care provider may also give you more specific instructions. Your treatment has been planned according to current medical practices, but problems sometimes occur. Call your health care provider if you have any problems or questions after your procedure. °WHAT TO EXPECT AFTER THE PROCEDURE °After your procedure, it is typical to have the following: °· A small amount of discomfort in the area where the biopsy was done and in the right shoulder or shoulder blade. °· A small amount of bruising around the area where the biopsy was done and on the skin over the liver. °· Sleepiness and fatigue for the rest of the day. °HOME CARE INSTRUCTIONS  °· Rest at home for 1-2 days or as directed by your health care provider. °· Have a friend or family member stay with you for at least 24 hours. °· Because of the medicines used during the procedure, you should not do the following things in the first 24 hours: °¨ Drive. °¨ Use machinery. °¨ Be responsible for the care of other people. °¨ Sign legal documents. °¨ Take a bath or shower. °· There are many different ways to close and cover an incision, including stitches, skin glue, and adhesive strips. Follow your health care provider's instructions on: °¨ Incision care. °¨ Bandage (dressing) changes and removal. °¨ Incision closure removal. °· Do not drink alcohol in the first week. °· Do not lift more than 5 pounds or play contact sports for 2 weeks after this test. °· Take medicines only as directed by your health care provider. Do not take medicine containing aspirin or non-steroidal anti-inflammatory medicines such as ibuprofen for 1 week after this test. °· It is your responsibility to get your test results. °SEEK MEDICAL CARE IF:  °· You have increased bleeding from an incision that results in more than a  small spot of blood. °· You have redness, swelling, or increasing pain in any incisions. °· You notice a discharge or a bad smell coming from any of your incisions. °· You have a fever or chills. °SEEK IMMEDIATE MEDICAL CARE IF:  °· You develop swelling, bloating, or pain in your abdomen. °· You become dizzy or faint. °· You develop a rash. °· You are nauseous or vomit. °· You have difficulty breathing, feel short of breath, or feel faint. °· You develop chest pain. °· You have problems with your speech or vision. °· You have trouble balancing or moving your arms or legs. °  °This information is not intended to replace advice given to you by your health care provider. Make sure you discuss any questions you have with your health care provider. °  °Document Released: 08/02/2004 Document Revised: 02/03/2014 Document Reviewed: 03/11/2013 °Elsevier Interactive Patient Education ©2016 [employer] ° °Moderate Conscious Sedation, Adult, Care After °Refer to this sheet in the next few weeks. These instructions provide you with information on caring for yourself after your procedure. Your health care provider may also give you more specific instructions. Your treatment has been planned according to current medical practices, but problems sometimes occur. Call your health care provider if you have any problems or questions after your procedure. °WHAT TO EXPECT AFTER THE PROCEDURE  °After your procedure: °· You may feel sleepy, clumsy, and have poor balance for several hours. °· Vomiting may occur if you eat too soon after the procedure. °  HOME CARE INSTRUCTIONS °· Do not participate in any activities where you could become injured for at least 24 hours. Do not: °¨ Drive. °¨ Swim. °¨ Ride a bicycle. °¨ Operate heavy machinery. °¨ Cook. °¨ Use power tools. °¨ Climb ladders. °¨ Work from a high place. °· Do not make important decisions or sign legal documents until you are improved. °· If you vomit, drink water, juice, or soup  when you can drink without vomiting. Make sure you have little or no nausea before eating solid foods. °· Only take over-the-counter or prescription medicines for pain, discomfort, or fever as directed by your health care provider. °· Make sure you and your family fully understand everything about the medicines given to you, including what side effects may occur. °· You should not drink alcohol, take sleeping pills, or take medicines that cause drowsiness for at least 24 hours. °· If you smoke, do not smoke without supervision. °· If you are feeling better, you may resume normal activities 24 hours after you were sedated. °· Keep all appointments with your health care provider. °SEEK MEDICAL CARE IF: °· Your skin is pale or bluish in color. °· You continue to feel nauseous or vomit. °· Your pain is getting worse and is not helped by medicine. °· You have bleeding or swelling. °· You are still sleepy or feeling clumsy after 24 hours. °SEEK IMMEDIATE MEDICAL CARE IF: °· You develop a rash. °· You have difficulty breathing. °· You develop any type of allergic problem. °· You have a fever. °MAKE SURE YOU: °· Understand these instructions. °· Will watch your condition. °· Will get help right away if you are not doing well or get worse. °  °This information is not intended to replace advice given to you by your health care provider. Make sure you discuss any questions you have with your health care provider. °  °Document Released: 11/03/2012 Document Revised: 02/03/2014 Document Reviewed: 11/03/2012 °Elsevier Interactive Patient Education ©2016 Elsevier Inc. ° ° °

## 2015-06-07 NOTE — H&P (Signed)
Chief Complaint: Patient was seen in consultation today for transjugular liver biopsy at the request of Manus Gunning  Referring Physician(s): Manus Gunning  Supervising Physician: Markus Daft  Patient Status: Out-pt  History of Present Illness: Alexander Grant. is a 60 y.o. male with complex medical hx including ESRD on HD T-Th-Sat. He has had fairly recent onset of refractory ascites requiring multiple paracentesis over the past several weeks. Workup has suggested evidence of cirrhosis and portal HTN. He has seen GI in consult and they have requested liver biopsy as well as portal pressures. He is scheduled today for transjugular liver biopsy procedure. He has been NPO all day. He feels ok. Had HD yesterday.  Past Medical History  Diagnosis Date  . Fournier gangrene     with peri-rectal/perineal abscess drainage  . PONV (postoperative nausea and vomiting)   . Wegener's granulomatosis with renal involvement ([place])   . Anemia   . History of blood transfusion 08/2011  . Lower GI bleeding 10/01/2011  . Cocaine abuse 2013  . Tubular adenoma of colon   . Hyponatremia   . Acute renal failure ([place])   . Pulmonary infiltrate   . Reflux esophagitis   . Diverticulosis   . Right thyroid nodule   . Cardiomegaly   . Renal disorder     Dialysis  . Kidney stone   . Hypertension   . Upper GI bleed 04/2015    Past Surgical History  Procedure Laterality Date  . Esophagogastroduodenoscopy  09/10/2011    Procedure: ESOPHAGOGASTRODUODENOSCOPY (EGD);  Surgeon: Jerene Bears, MD;  Location: [place];  Service: Gastroenterology;  Laterality: N/A;  . Colonoscopy  09/11/2011    Procedure: COLONOSCOPY;  Surgeon: Jerene Bears, MD;  Location: [place];  Service: Gastroenterology;  Laterality: N/A;  . Knee arthroscopy w/ medial collateral ligament (mcl) repair  ~ 2000    left  . Knee arthroscopy w/ acl reconstruction  2002  . Shoulder open rotator cuff repair    09/1999    right  . Incision and drainage perirectal abscess  05/2008    'for Fournier's gangrene"  . Kidney stone surgery  1990  . Vasectomy    . Fracture surgery Right     wrist and great toe  . Av fistula placement Left 02/07/2013    Procedure: ARTERIOVENOUS (AV) FISTULA CREATION- LEFT BRACHIOCEPHALIC ;  Surgeon: Rosetta Posner, MD;  Location: [place];  Service: Vascular;  Laterality: Left;  . Insertion of dialysis catheter N/A 02/07/2013    Procedure: INSERTION OF DIALYSIS CATHETER;  Surgeon: Rosetta Posner, MD;  Location: [place];  Service: Vascular;  Laterality: N/A;  . Anterior cruciate ligament repair    . Shoulder surgery Left   . Esophagogastroduodenoscopy N/A 05/09/2015    Procedure: ESOPHAGOGASTRODUODENOSCOPY (EGD);  Surgeon: Manus Gunning, MD;  Location: [place];  Service: Gastroenterology;  Laterality: N/A;    Allergies: Bee pollen; Bee venom; and Percocet  Medications: Prior to Admission medications   Medication Sig Start Date End Date Taking? Authorizing Provider  Amino Acids-Protein Hydrolys (FEEDING SUPPLEMENT, PRO-STAT SUGAR FREE 64,) LIQD Take 30 mLs by mouth 2 (two) times daily. 05/23/15  Yes Liberty Handy, MD  Darbepoetin Alfa (ARANESP) 200 MCG/0.4ML SOSY injection Inject 0.4 mLs (200 mcg total) into the vein every Tuesday with hemodialysis. 05/12/15  Yes Liberty Handy, MD  doxycycline (VIBRA-TABS) 100 MG tablet Take 1 tablet (100 mg total) by mouth every 12 (twelve) hours. 06/06/15  Yes Milagros Loll,  MD  HYDROcodone-acetaminophen (NORCO/VICODIN) 5-325 MG tablet Take 1 tablet by mouth every 6 (six) hours as needed for moderate pain. 05/30/15  Yes Liberty Handy, MD  hydrocortisone cream 1 % Apply topically 4 (four) times daily. 05/23/15  Yes Liberty Handy, MD  levofloxacin (LEVAQUIN) 500 MG tablet Take 1 tablet (500 mg total) by mouth every other day. 06/06/15  Yes Milagros Loll, MD  multivitamin (RENA-VIT) TABS tablet Take 1 tablet by mouth at bedtime. 05/12/15  Yes  Liberty Handy, MD  Nutritional Supplements (FEEDING SUPPLEMENT, NEPRO CARB STEADY,) LIQD Take 237 mLs by mouth 3 (three) times daily between meals. 05/12/15  Yes Liberty Handy, MD  pantoprazole (PROTONIX) 40 MG tablet Take 1 tablet (40 mg total) by mouth 2 (two) times daily. 05/12/15  Yes Liberty Handy, MD  sevelamer carbonate (RENVELA) 800 MG tablet Take 3 tablets (2,400 mg total) by mouth 3 (three) times daily with meals. Patient taking differently: Take 800-2,400 mg by mouth 3 (three) times daily with meals. Takes 3 tabs with each meal and 1 tab with each snack 05/12/15  Yes Liberty Handy, MD  ondansetron (ZOFRAN) 4 MG tablet Take 1 tablet (4 mg total) by mouth every 6 (six) hours as needed for nausea. 05/12/15   Liberty Handy, MD  polyethylene glycol [facility] / Floria Raveling) packet Take 17 g by mouth daily. Patient taking differently: Take 17 g by mouth daily as needed for moderate constipation.  05/12/15   Liberty Handy, MD     Family History  Problem Relation Age of Onset  . Stroke Mother   . Cancer Father     Social History   Social History  . Marital Status: Divorced    Spouse Name: N/A  . Number of Children: N/A  . Years of Education: N/A   Social History Main Topics  . Smoking status: Never Smoker   . Smokeless tobacco: Never Used  . Alcohol Use: No     Comment: 10/01/2011 "last alcohol ~ 2012"  . Drug Use: No     Comment: as recent as 09/02/11 per toxicology screen at hospital  . Sexual Activity: Not Currently   Other Topics Concern  . None   Social History Narrative   ** Merged History Encounter **        Review of Systems: A 12 point ROS discussed and pertinent positives are indicated in the HPI above.  All other systems are negative.  Review of Systems  Vital Signs: There were no vitals taken for this visit.  Physical Exam  Constitutional: He is oriented to person, place, and time. He appears well-developed and well-nourished. No distress.  HENT:  Head: Normocephalic.    Mouth/Throat: Oropharynx is clear and moist.  Neck: Normal range of motion. No tracheal deviation present.  Cardiovascular: Normal rate, regular rhythm and normal heart sounds.   Pulmonary/Chest: Effort normal and breath sounds normal. No respiratory distress.  Abdominal: Soft. He exhibits no distension. There is no tenderness.  Neurological: He is alert and oriented to person, place, and time.  Skin:  (L)UE AVF, easily visible/palpable  Psychiatric: He has a normal mood and affect. Judgment normal.    Mallampati Score:  MD Evaluation Airway: WNL Heart: WNL Abdomen: WNL Chest/ Lungs: WNL ASA  Classification: 3 Mallampati/Airway Score: One    Labs:  CBC:  Recent Labs  05/22/15 0556 06/04/15 2337 06/05/15 0332 06/06/15 0438  WBC 6.0 1.5* 2.5* 5.1  HGB 11.8* 17.1* 9.1* 9.8*  HCT 36.9* 51.5 28.5* 30.5*  PLT 171  111* 143* 169    COAGS:  Recent Labs  05/08/15 1500 05/19/15 1439 06/05/15 0238  INR 1.25 1.21 1.40    BMP:  Recent Labs  05/22/15 0556 06/04/15 2337 06/05/15 0332 06/06/15 0431  NA 130* 130* 131* 137  K 5.3* >7.5* 7.2* 4.1  CL 87* 91* 96* 98*  CO2 24 18* 14* 21*  GLUCOSE 145* 89 52* 138*  BUN 107* 178* 181* 83*  CALCIUM 8.2* 9.3 8.4* 8.4*  CREATININE 12.66* 14.24* 13.47* 8.29*  GFRNONAA 4* 3* 3* 6*  GFRAA 4* 4* 4* 7*    LIVER FUNCTION TESTS:  Recent Labs  05/19/15 1439 05/20/15 0410  05/22/15 0556 06/04/15 2337 06/05/15 0332 06/06/15 0431  BILITOT 0.7 0.7  --   --  1.1 0.8  --   AST 31 28  --   --  19 25  --   ALT 14* 13*  --   --  19 15*  --   ALKPHOS 87 94  --   --  89 70  --   PROT 6.0* 5.8*  --   --  5.7* 4.4*  --   ALBUMIN 1.8* 1.7*  < > 1.9* 1.7* 1.3* 1.2*  < > = values in this interval not displayed.   Assessment and Plan: Refractory ascites Portal HTN Probable cirrhosis Plan for transjugular liver biopsy with portal pressures Labs reviewed, ok Risks and Benefits discussed with the patient including, but not  limited to bleeding, infection, vascular injury or contrast induced renal failure. All of the patient's questions were answered, patient is agreeable to proceed. Consent signed and in chart.    Thank you for this interesting consult.  A copy of this report was sent to the requesting provider on this date.  Electronically Signed: Ascencion Dike 06/07/2015, 2:41 PM   I spent a total of 20 minutes in face to face in clinical consultation, greater than 50% of which was counseling/coordinating care for transjugular liver biopsy.

## 2015-06-10 LAB — CULTURE, BLOOD (ROUTINE X 2)
CULTURE: NO GROWTH
Culture: NO GROWTH

## 2015-06-12 ENCOUNTER — Other Ambulatory Visit: Payer: Self-pay | Admitting: *Deleted

## 2015-06-12 DIAGNOSIS — K766 Portal hypertension: Secondary | ICD-10-CM

## 2015-06-13 ENCOUNTER — Other Ambulatory Visit: Payer: Self-pay | Admitting: *Deleted

## 2015-06-13 DIAGNOSIS — K766 Portal hypertension: Secondary | ICD-10-CM

## 2015-06-14 ENCOUNTER — Telehealth: Payer: Self-pay | Admitting: Licensed Clinical Social Worker

## 2015-06-14 ENCOUNTER — Other Ambulatory Visit: Payer: Self-pay | Admitting: Internal Medicine

## 2015-06-14 DIAGNOSIS — R188 Other ascites: Secondary | ICD-10-CM

## 2015-06-14 NOTE — Telephone Encounter (Signed)
Spoke with patient.  Mild SOB, swelling began a few days ago, it is worse on the left than right.  Requesting to come in this afternoon or tomorrow afternoon.  Dr. Marijean Bravo, does he come to clinic or are you sending him somewhere?

## 2015-06-14 NOTE — Telephone Encounter (Signed)
Patient would benefit symptomatically from paracentesis. I spoke with him over the phone and he agrees. He says ~9L taken off last time was excessive. We will remove 5L at his next session. He goes to outpatient radiology and does not need to come to the clinic for this.

## 2015-06-14 NOTE — Telephone Encounter (Signed)
CSW received voicemail message from [name].  Pt requesting referral for paracentesis from physician "ASAP".  CSW will forward this note to Triage.

## 2015-06-14 NOTE — Telephone Encounter (Signed)
Dr. Marijean Bravo, you aren't listed as PCP but I see your note during patients last visit.

## 2015-06-15 ENCOUNTER — Other Ambulatory Visit: Payer: Self-pay | Admitting: Internal Medicine

## 2015-06-15 DIAGNOSIS — R188 Other ascites: Secondary | ICD-10-CM

## 2015-06-18 ENCOUNTER — Ambulatory Visit (HOSPITAL_COMMUNITY)
Admission: RE | Admit: 2015-06-18 | Discharge: 2015-06-18 | Disposition: A | Discharge status: Home / Self Care | Payer: Medicaid Other | Source: Ambulatory Visit | Attending: Internal Medicine | Admitting: Internal Medicine

## 2015-06-18 DIAGNOSIS — R188 Other ascites: Secondary | ICD-10-CM | POA: Diagnosis present

## 2015-06-18 NOTE — Procedures (Signed)
Ultrasound-guided therapeutic paracentesis performed yielding 4.1 liters of clear yellow colored fluid. No immediate complications.  Alexander Grant E 3:00 PM 06/18/2015

## 2015-06-19 ENCOUNTER — Telehealth: Payer: Self-pay | Admitting: *Deleted

## 2015-06-19 NOTE — Telephone Encounter (Signed)
Any way to touch base with [facility] to see if they can get him in sooner? If [facility] does not get back to [place] I would make the referral, I don't want him waiting too much

## 2015-06-19 NOTE — Telephone Encounter (Signed)
Faxed records over th [facility]. Spoke with Chelsa at [facility] and she will watch for records and schedule patient.

## 2015-06-19 NOTE — Telephone Encounter (Signed)
-----   Message from Hulan Saas, RN sent at 06/13/2015 10:33 AM EDT ----- Call [facility] and see if patient's appointment can be moved up.

## 2015-06-19 NOTE — Telephone Encounter (Signed)
Called [facility] and the MD is still reviewing records. Patient's OV has not been moved up yet. Dr. Havery Moros, do you want me to wait a few more days on this or go ahead and refer to [facility]?

## 2015-06-20 ENCOUNTER — Encounter: Payer: Self-pay | Admitting: Internal Medicine

## 2015-06-20 ENCOUNTER — Ambulatory Visit: Payer: Self-pay | Admitting: Internal Medicine

## 2015-06-21 NOTE — Telephone Encounter (Signed)
Received a fax from [facility] that patient is scheduled for OV on July 16, 2015 at 2:00 PM. Patient is aware. Cancelled OV at [facility].

## 2015-06-22 ENCOUNTER — Telehealth: Payer: Self-pay | Admitting: Gastroenterology

## 2015-06-26 ENCOUNTER — Telehealth: Payer: Self-pay | Admitting: *Deleted

## 2015-06-26 ENCOUNTER — Ambulatory Visit (INDEPENDENT_AMBULATORY_CARE_PROVIDER_SITE_OTHER): Payer: Medicaid Other | Admitting: Internal Medicine

## 2015-06-26 ENCOUNTER — Encounter: Payer: Self-pay | Admitting: Internal Medicine

## 2015-06-26 VITALS — BP 122/93 | HR 88 | Temp 97.3°F | Ht 74.0 in | Wt 172.8 lb

## 2015-06-26 DIAGNOSIS — R188 Other ascites: Secondary | ICD-10-CM

## 2015-06-26 NOTE — Telephone Encounter (Signed)
Patient requesting an appointment for a paracentesis due to increase discomfort. After speaking with attending and front office, patient was added to the schedule for 2:15 this afternoon.  FYI

## 2015-06-26 NOTE — Telephone Encounter (Signed)
Yes we can refill it for him. Thanks. Otherwise I am waiting on his evaluation by Hepatology. Thanks

## 2015-06-26 NOTE — Progress Notes (Signed)
Patient ID: Alexander Grant., male   DOB: August 30, 1955, 60 y.o.   MRN: [id number]   Subjective:   Patient ID: Alexander Grant. male   DOB: 03/24/1955 60 y.o.   MRN: [id number]  HPI: Alexander Festus Lennox. is a 60 y.o. with PMH listed below. Presented today to get paracentesis done- therapeutic. He has had 5 so far, and 2 within the past month. He follow with a hepatologist.  Past Medical History  Diagnosis Date  . Fournier gangrene     with peri-rectal/perineal abscess drainage  . PONV (postoperative nausea and vomiting)   . Wegener's granulomatosis with renal involvement ([place])   . Anemia   . History of blood transfusion 08/2011  . Lower GI bleeding 10/01/2011  . Cocaine abuse 2013  . Tubular adenoma of colon   . Hyponatremia   . Acute renal failure ([place])   . Pulmonary infiltrate   . Reflux esophagitis   . Diverticulosis   . Right thyroid nodule   . Cardiomegaly   . Renal disorder     Dialysis  . Kidney stone   . Hypertension   . Upper GI bleed 04/2015   Review of Systems: CONSTITUTIONAL- No Fever, pt feels he is loosing weight. SKIN- No Rash HEAD- No Headache or dizziness. GI- No vomiting, diarrhoea, positive abd distension. URINARY- No Frequency, or dysuria. NEUROLOGIC- No syncope, seizures.  Objective:  Physical Exam: Filed Vitals:   06/26/15 1427  BP: 122/93  Pulse: 88  Temp: 97.3 F (36.3 C)  TempSrc: Oral  Height: 6\' 2"  (1.88 m)  Weight: 172 lb 12.8 oz (78.382 kg)  SpO2: 100%       GENERAL- alert, co-operative, appears as stated age, not in obvious distress, appears malnourished. HEENT- Atraumatic, normocephalic CARDIAC- RRR, has a 3/6  murmur in aortic area. RESP- Moving equal volumes of air, no wheezes or crackles. ABDOMEN- distended , protusion of umbilicus. NEURO- Alert and oriented, No obvious Cr N abnormality, Moving all extremities spontaneously. EXTREMITIES- Warm, no pedal edema. SKIN- Warm, dry, No rash or lesion, but  sallow skin appearance. PSYCH- Normal mood and affect, appropriate thought content and speech.  Assessment & Plan:  The patient's case and plan of care was discussed with attending physician, Dr. Damita Dunnings.  Please see problem based charting for assessment and plan.

## 2015-06-26 NOTE — Telephone Encounter (Signed)
Pt requesting refills of Zofran 4mg  1 po q 6 hours as needed. Please advise.

## 2015-06-26 NOTE — Patient Instructions (Signed)
It was nice seeing you today. Let us know ~1 week before you require another paracentesis so we can arrange for the procedure to be done. Please keep your appointment for next month.  If you feel dizzy, let us know. Please take your medications as prescribed and follow up with your liver doctor and Rheumatologist.

## 2015-06-27 ENCOUNTER — Telehealth: Payer: Self-pay | Admitting: *Deleted

## 2015-06-27 MED ORDER — ONDANSETRON HCL 4 MG PO TABS: 4.0000 mg | ORAL_TABLET | Freq: Four times a day (QID) | ORAL | Status: AC | PRN

## 2015-06-27 NOTE — Telephone Encounter (Signed)
Medication reordered as directed.

## 2015-06-27 NOTE — Telephone Encounter (Signed)
Patient has not had PPD test. He states he will do this. Did not want to schedule today.

## 2015-06-27 NOTE — Assessment & Plan Note (Addendum)
Etiology still unclear, but pt had a Liver biopsy that revealed mild chronic active inflammation with increased iron. He has had Ascites fluid analysis- 2ce last month. He might be a candidate for a TIPS procedure , if he is not able to tolerate large volume paracentesis, or they become too frequent. He has had 2 paracentesis procedures in the past month.   Procedure Note- procedure done- 06/26/15, with the aide of a bedside Ultrasound, with ~5cm of fluid between abdominal wall and bowel, visualized.   Consent was obtained from the patient. Area prepped - right flank region, midway between the coastal margin and iliac crest. Area was prepped with Povidone-iodine. Area was draped in sterile fashion and  infiltrated with 1% lidocaine. The paracentesis catheter was inserted and advanced with negative pressure until colored fluid was aspirated.  The catheter was then connected to the vacuum container and 4.6 liters of straw coloured ascitic fluid was drained. The catheter was removed, and pressure with gauze was immediately applied to the area, no leak was noted. A bandaid was placed over the puncture wound. The patient tolerated the procedure well without any immediate complications, no dizziness. Estimated blood loss was 37mls.  Pt was counselled on complications, he voiced understanding. Pt encouraged to follow up early next month, already has appointment to address other medical issues.

## 2015-06-27 NOTE — Telephone Encounter (Signed)
-----   Message from Hulan Saas, RN sent at 06/06/2015  4:00 PM EDT ----- Did patient schedule PPD for [facility]?

## 2015-06-28 NOTE — Addendum Note (Signed)
Addended by: Lalla Brothers T on: 06/28/2015 09:10 AM   Modules accepted: Level of Service

## 2015-06-28 NOTE — Progress Notes (Signed)
Internal Medicine Clinic Attending  I saw and evaluated the patient.  I personally confirmed the key portions of the history and exam documented by Dr. Denton Brick and I reviewed pertinent patient test results.  The assessment, diagnosis, and plan were formulated together and I agree with the documentation in the resident's note. I was present for the entirety of the procedure and personally performed key portions. The patient tolerated the procedure well. Point of care ultrasound was used during this procedure. He can follow up with [place] ever 2-4 weeks as needed for repeat paracentesis.

## 2015-06-29 ENCOUNTER — Telehealth: Payer: Self-pay | Admitting: Internal Medicine

## 2015-06-29 NOTE — Telephone Encounter (Signed)
APT. REMINDER CALL, LMTCB °

## 2015-07-02 ENCOUNTER — Encounter: Payer: Self-pay | Admitting: Internal Medicine

## 2015-07-02 ENCOUNTER — Ambulatory Visit: Payer: Self-pay | Admitting: Internal Medicine

## 2015-07-05 ENCOUNTER — Ambulatory Visit (INDEPENDENT_AMBULATORY_CARE_PROVIDER_SITE_OTHER): Payer: Medicaid Other | Admitting: Internal Medicine

## 2015-07-05 ENCOUNTER — Encounter: Payer: Self-pay | Admitting: Internal Medicine

## 2015-07-05 VITALS — BP 108/76 | HR 79 | Temp 97.4°F | Ht 74.0 in | Wt 172.2 lb

## 2015-07-05 DIAGNOSIS — R188 Other ascites: Secondary | ICD-10-CM | POA: Diagnosis not present

## 2015-07-05 DIAGNOSIS — M545 Low back pain: Secondary | ICD-10-CM | POA: Diagnosis not present

## 2015-07-05 LAB — BODY FLUID CELL COUNT WITH DIFFERENTIAL
Eos, Fluid: 0 %
Lymphs, Fluid: 30 %
MONOCYTE-MACROPHAGE-SEROUS FLUID: 61 % (ref 50–90)
Neutrophil Count, Fluid: 9 % (ref 0–25)
WBC FLUID: 130 uL (ref 0–1000)

## 2015-07-05 LAB — ALBUMIN, FLUID (OTHER): Albumin, Fluid: <1 g/dL

## 2015-07-05 NOTE — Patient Instructions (Signed)
Alexander Grant,   Always great to see you.  If you start to feel like your fluid is building up again, please come back.

## 2015-07-05 NOTE — Progress Notes (Signed)
   Subjective:    Patient ID: Alexander Grant., male    DOB: 1955/08/27, 60 y.o.   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 Patient's last paracentesis was 5/30 but he has noticed it gradually returning since then, and now his pants do not fit well. He also describes ongoing lower back pain, which started with the onset of his ascites. He reports that hydrocodone has been ineffective.   Review of Systems  Gastrointestinal: Positive for abdominal distention. Negative for abdominal pain.  Musculoskeletal: Positive for back pain. Negative for arthralgias.       Objective:   Physical Exam  Constitutional: No distress.  Chronically ill appearing man.  Cardiovascular: Normal rate, regular rhythm and normal heart sounds.   Pulmonary/Chest: Effort normal and breath sounds normal. No respiratory distress. He has no wheezes.  Abdominal: He exhibits distension. There is no tenderness.  Distended abdomen with positive fluid wave.  Vitals reviewed.     Paracentesis Procedure Note  Indications:  Ascites  Procedure Details  Informed consent was obtained after explanation of the risks and benefits of the procedure, refer to the consent documentation.  Time-out was performed immediately prior to the procedure.  The head of the bed was placed 30-45 degrees above level and the meniscus of the ascites was evaluated by bedside ultrasound and a large area of ascitic fluid was identified in the left peritoneum  Local anesthesia with 1 percent lidocaine was introduced subcutaneously then deep to the skin until the parietal peritoneum was anesthetized. A parcentesis needle was introduced into this site until ascitic fluid was encountered.  Ascitic fluid and the needle were removed with minimal bleeding.  A  sterile bandage was placed after holding pressure.   Findings: 5000 mL of straw-colored ascites fluid was obtained.  The ascites fluid was sent for albumin and cell counts.        Condition:   The patient tolerated the procedure well and remains in the same condition as pre-procedure.  Complications: None; patient tolerated the procedure well.       Assessment & Plan:   Please see problem based assessment and plan for details.

## 2015-07-06 LAB — PATHOLOGIST SMEAR REVIEW

## 2015-07-06 NOTE — Progress Notes (Signed)
Internal Medicine Clinic Attending  I saw and evaluated the patient.  I personally confirmed the key portions of the history and exam documented by Dr. Marijean Bravo and I reviewed pertinent patient test results.  The assessment, diagnosis, and plan were formulated together and I agree with the documentation in the resident's note. I was present for the entirety of the procedure and personally performed key portions.

## 2015-07-06 NOTE — Assessment & Plan Note (Signed)
A: Patient has an appointment with GI later this month to investigate cause of ascites. Ascitic fluid albumin is <1, which would be consistent with cirrhosis. He cannot tolerate large volume paracentesis. Iron seen on liver biopsy. We will obtain Hemochromatosis gene PCR today. 5L of fluid was taken off today.  P: F/u with GI Return if volume reaccumulates

## 2015-07-06 NOTE — Assessment & Plan Note (Signed)
A: Back pain did not respond to opioids. Likely musculoskeletal pain given weight of ascitic fluid.  P: Counseled patient that once a long term solution to ascites is reached (e.g., TIPS), his pain may resolve thereafter.

## 2015-07-10 ENCOUNTER — Telehealth: Payer: Self-pay | Admitting: *Deleted

## 2015-07-12 LAB — HEMOCHROMATOSIS DNA-PCR(C282Y,H63D)

## 2015-07-16 ENCOUNTER — Telehealth: Payer: Self-pay | Admitting: *Deleted

## 2015-07-16 NOTE — Telephone Encounter (Signed)
-----   Message from Hulan Saas, RN sent at 06/27/2015  2:44 PM EDT ----- Did patient get PPD for SA? (called X2)

## 2015-07-16 NOTE — Telephone Encounter (Signed)
Thanks Rollene Fare, Dr. 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 I have referred him to a tertiary care center for which he did not show up for his appointment, and also has not completed additional testing as we requested. Just wanted to make you aware as I see you have seen him a few times for this issue. Thanks

## 2015-07-16 NOTE — Telephone Encounter (Signed)
Left a message for patient to call back. 

## 2015-07-16 NOTE — Telephone Encounter (Signed)
Patient has not come to lab for TB testing. He has been called multiple times for this.

## 2015-07-27 ENCOUNTER — Ambulatory Visit: Payer: Self-pay | Admitting: Internal Medicine

## 2015-07-28 NOTE — Telephone Encounter (Signed)
Ems calls pt deceased, transferred to dr Software engineer

## 2015-07-28 NOTE — Telephone Encounter (Signed)
Did he say why he did not show up? If he wishes to have further evaluation for this issue, which is a serious condition, he needs to follow up with them. If he is willing to do this, can you help reschedule him? Thanks for your help with this

## 2015-07-28 NOTE — Telephone Encounter (Signed)
Patient did not show up for new patient visit at [facility] liver.

## 2015-07-28 DEATH — deceased

## 2017-03-29 IMAGING — US US ART/VEN ABD/PELV/SCROTUM DOPPLER LTD
1 series · 13 of 25 positions shown · non-contrast
Comparison: 04/27/2015, 05/11/2015

CLINICAL DATA: End-stage renal disease, dialysis dependent,
cirrhosis, abdominal ascites and distension

EXAM:
DUPLEX ULTRASOUND OF LIVER
TECHNIQUE: Color and duplex Doppler ultrasound was performed to evaluate the
hepatic in-flow and out-flow vessels.

[Series 1: us art/ven abd/pelv/scrotum doppler ltd · 0.28mm/px · 13 of 47 slices shown]
[im 1/47]
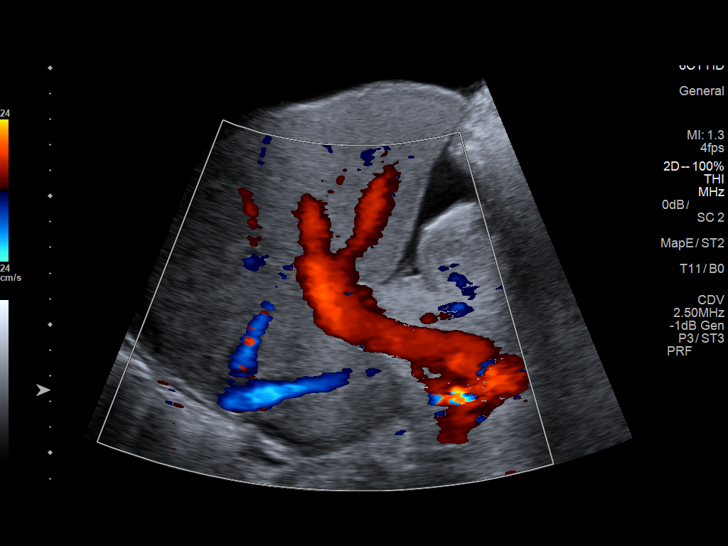
[im 4/47]
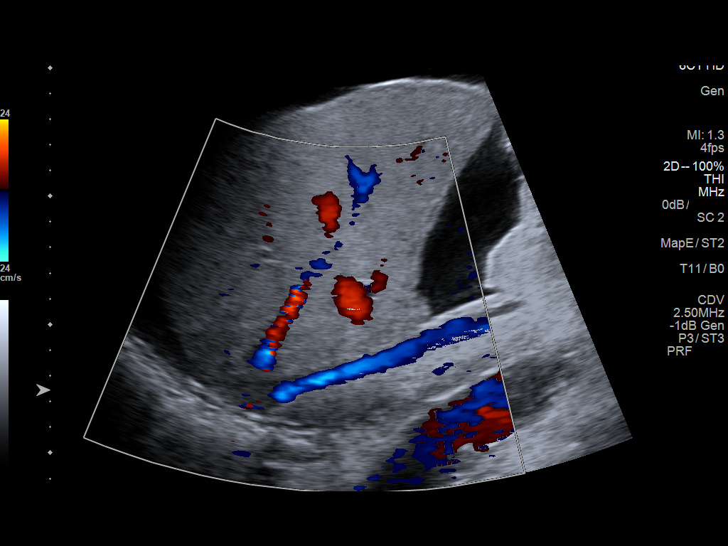
[im 8/47]
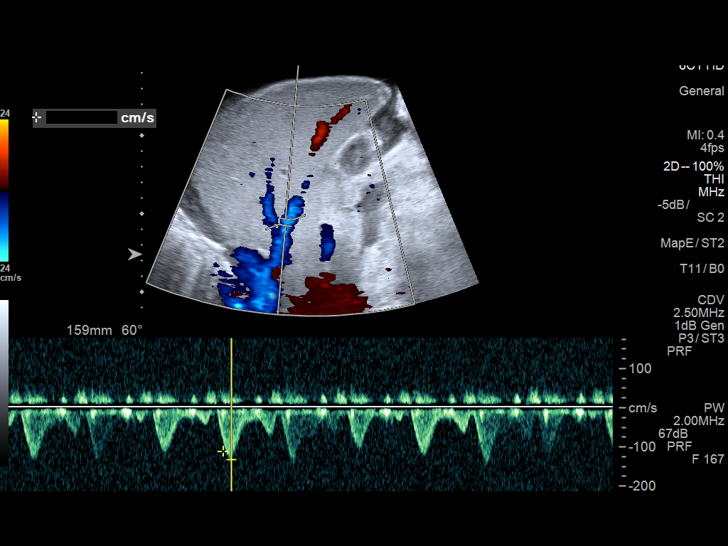
[im 12/47]
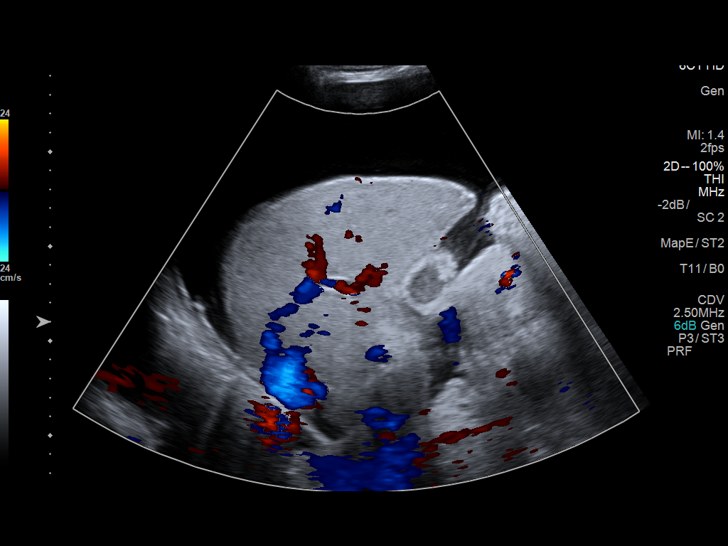
[im 16/47]
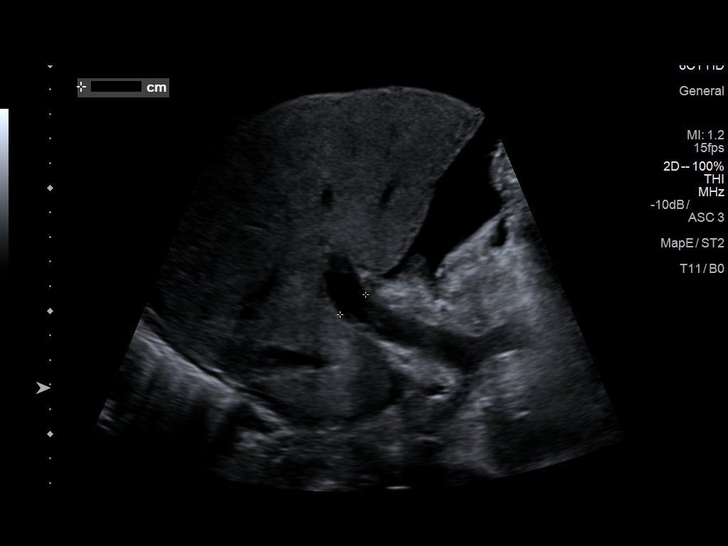
[im 20/47]
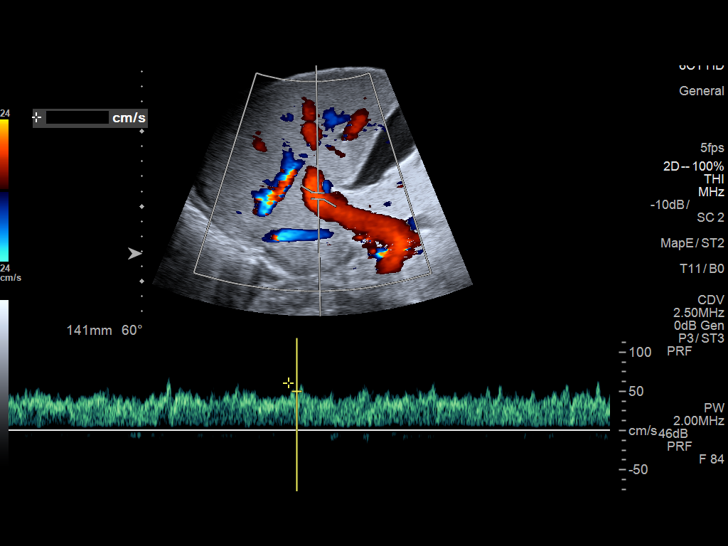
[im 24/47]
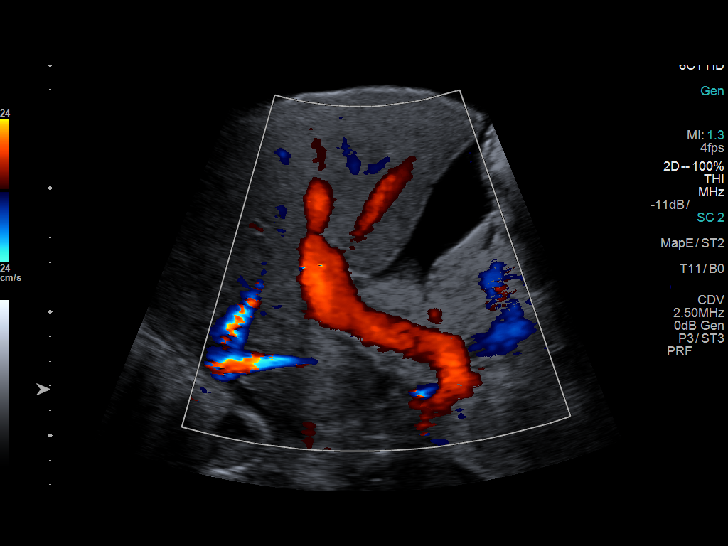
[im 27/47]
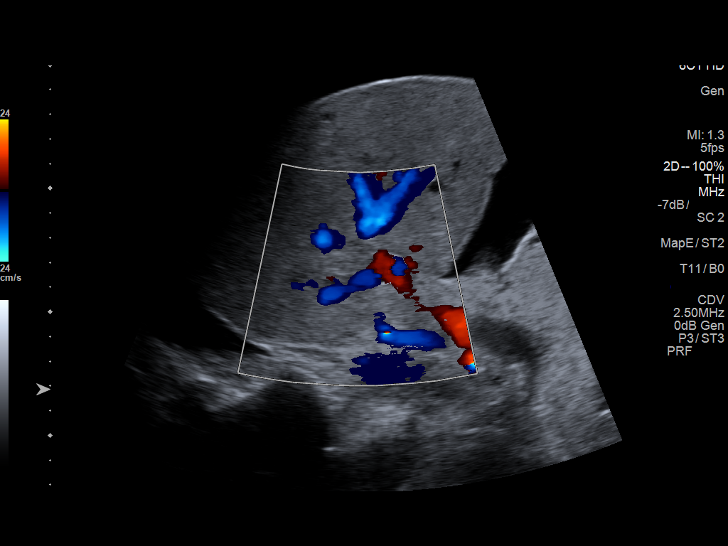
[im 31/47]
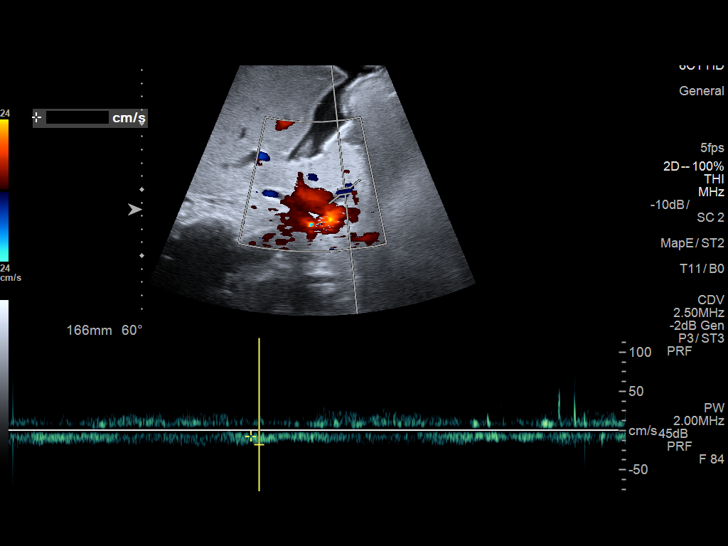
[im 35/47]
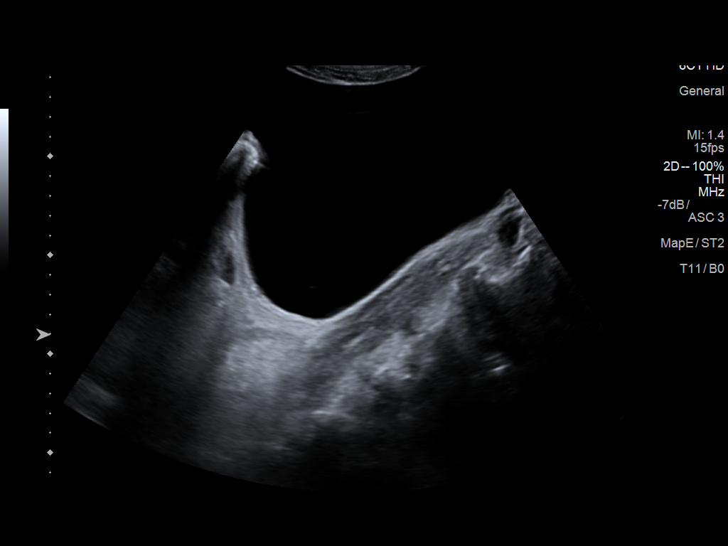
[im 39/47]
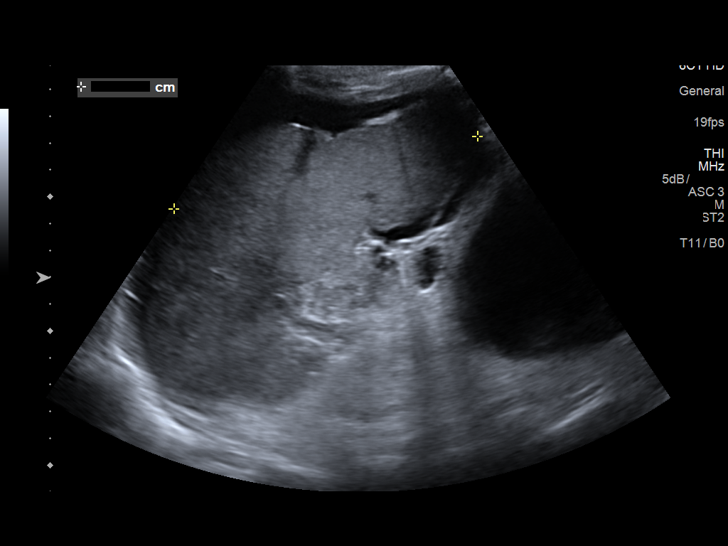
[im 43/47]
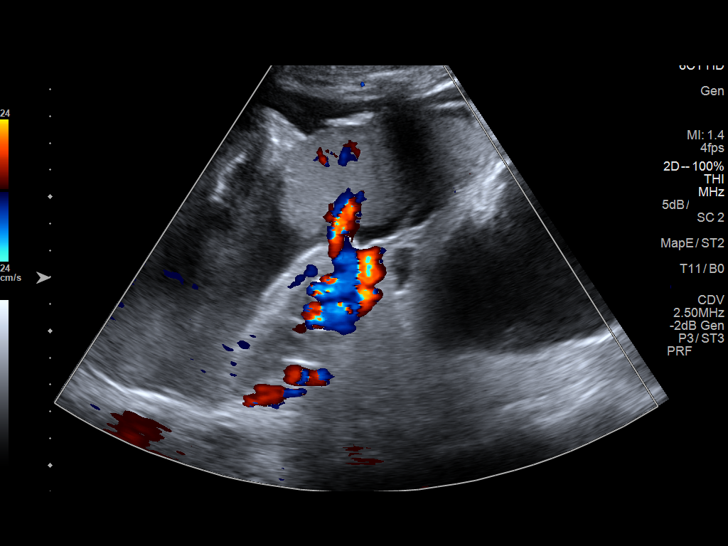
[im 47/47]
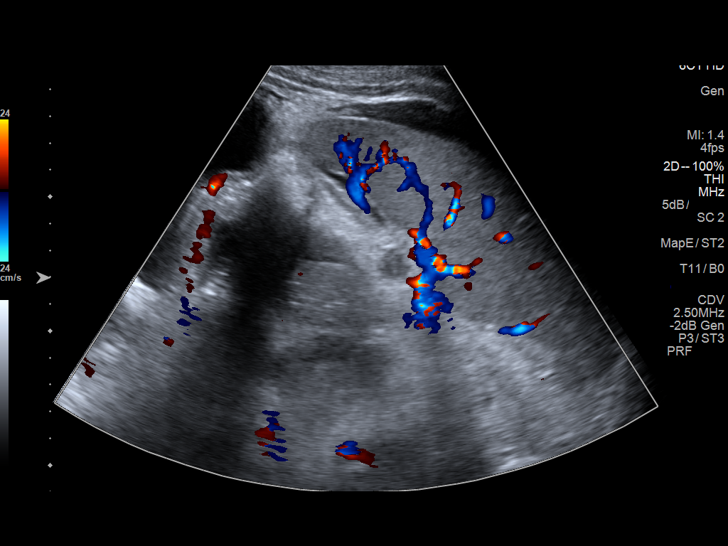

[13 of 25 positions shown; findings below may reference images not displayed]

FINDINGS: Portal Vein Velocities

Main:  35 cm/sec

Right:  36 cm/sec

Left:  45 cm/sec

Hepatic Vein Velocities

Right:  240 cm/sec

Middle:  132 cm/sec

Left:  37 cm/sec

Hepatic Artery Velocity:  126 cm/sec

Splenic Vein Velocity:  20 cm/sec

Varices: Varices noted in the splenic hilum.

Ascites: Large volume diffuse abdominal ascites noted.

Portal vein is dilated with a 13 mm diameter. Portal, hepatic, and
splenic veins are patent. Main portal vein and left portal vein
remain hepatopetal. Right portal vein is hepatofugal.

Splenic vein remains hepatopetal.

All hepatic veins are patent and hepatofugal.

No evidence of portal vein or splenic vein thrombus or occlusion.

Cirrhotic changes noted of the liver.  Spleen is enlarged.
IMPRESSION: Patent portal, hepatic and splenic veins without occlusion or
thrombus.

Evidence of portal hypertension with a dilated portal vein. Main
portal vein and left portal vein remain hepatopetal however the
right portal vein is hepatofugal.

Large volume of ascites

Splenomegaly

Left upper quadrant varices noted in the splenic hilum.
